# Patient Record
Sex: Male | Born: 1945 | Race: White | Hispanic: No | Marital: Married | Smoking: Never smoker
Health system: Southern US, Community
[De-identification: ages and names within clinical notes are randomized; demographics above are authoritative.]

## PROBLEM LIST (undated history)

## (undated) DIAGNOSIS — K219 Gastro-esophageal reflux disease without esophagitis: Secondary | ICD-10-CM

## (undated) DIAGNOSIS — F411 Generalized anxiety disorder: Secondary | ICD-10-CM

## (undated) DIAGNOSIS — Z9289 Personal history of other medical treatment: Secondary | ICD-10-CM

## (undated) DIAGNOSIS — M109 Gout, unspecified: Secondary | ICD-10-CM

## (undated) DIAGNOSIS — E785 Hyperlipidemia, unspecified: Secondary | ICD-10-CM

## (undated) DIAGNOSIS — Z7689 Persons encountering health services in other specified circumstances: Secondary | ICD-10-CM

## (undated) DIAGNOSIS — Z8719 Personal history of other diseases of the digestive system: Secondary | ICD-10-CM

## (undated) DIAGNOSIS — I2510 Atherosclerotic heart disease of native coronary artery without angina pectoris: Secondary | ICD-10-CM

## (undated) DIAGNOSIS — G709 Myoneural disorder, unspecified: Secondary | ICD-10-CM

## (undated) DIAGNOSIS — Z87442 Personal history of urinary calculi: Secondary | ICD-10-CM

## (undated) DIAGNOSIS — I739 Peripheral vascular disease, unspecified: Secondary | ICD-10-CM

## (undated) DIAGNOSIS — N189 Chronic kidney disease, unspecified: Secondary | ICD-10-CM

## (undated) DIAGNOSIS — C801 Malignant (primary) neoplasm, unspecified: Secondary | ICD-10-CM

## (undated) DIAGNOSIS — R0600 Dyspnea, unspecified: Secondary | ICD-10-CM

## (undated) DIAGNOSIS — I4891 Unspecified atrial fibrillation: Secondary | ICD-10-CM

## (undated) DIAGNOSIS — M1990 Unspecified osteoarthritis, unspecified site: Secondary | ICD-10-CM

## (undated) DIAGNOSIS — R519 Headache, unspecified: Secondary | ICD-10-CM

## (undated) DIAGNOSIS — I10 Essential (primary) hypertension: Secondary | ICD-10-CM

## (undated) DIAGNOSIS — I252 Old myocardial infarction: Secondary | ICD-10-CM

## (undated) HISTORY — PX: CARDIAC CATHETERIZATION: SHX172

## (undated) HISTORY — DX: Hyperlipidemia, unspecified: E78.5

## (undated) HISTORY — DX: Generalized anxiety disorder: F41.1

## (undated) HISTORY — DX: Atherosclerotic heart disease of native coronary artery without angina pectoris: I25.10

## (undated) HISTORY — DX: Gout, unspecified: M10.9

## (undated) HISTORY — PX: APPENDECTOMY: SHX54

## (undated) HISTORY — DX: Unspecified atrial fibrillation: I48.91

## (undated) HISTORY — DX: Old myocardial infarction: I25.2

## (undated) HISTORY — PX: EYE SURGERY: SHX253

## (undated) HISTORY — DX: Headache, unspecified: R51.9

## (undated) HISTORY — DX: Peripheral vascular disease, unspecified: I73.9

## (undated) HISTORY — PX: CHOLECYSTECTOMY: SHX55

## (undated) HISTORY — PX: OTHER SURGICAL HISTORY: SHX169

---

## 1968-11-09 HISTORY — PX: BLADDER SURGERY: SHX569

## 2011-11-10 HISTORY — PX: SHOULDER SURGERY: SHX246

## 2011-12-09 DIAGNOSIS — Q409 Congenital malformation of upper alimentary tract, unspecified: Secondary | ICD-10-CM | POA: Diagnosis not present

## 2011-12-09 DIAGNOSIS — Z79899 Other long term (current) drug therapy: Secondary | ICD-10-CM | POA: Diagnosis not present

## 2011-12-09 DIAGNOSIS — E785 Hyperlipidemia, unspecified: Secondary | ICD-10-CM | POA: Diagnosis not present

## 2011-12-09 DIAGNOSIS — I10 Essential (primary) hypertension: Secondary | ICD-10-CM | POA: Diagnosis not present

## 2011-12-09 DIAGNOSIS — G4700 Insomnia, unspecified: Secondary | ICD-10-CM | POA: Diagnosis not present

## 2011-12-09 DIAGNOSIS — J45909 Unspecified asthma, uncomplicated: Secondary | ICD-10-CM | POA: Diagnosis not present

## 2011-12-09 DIAGNOSIS — M159 Polyosteoarthritis, unspecified: Secondary | ICD-10-CM | POA: Diagnosis not present

## 2011-12-31 DIAGNOSIS — Z86010 Personal history of colonic polyps: Secondary | ICD-10-CM | POA: Diagnosis not present

## 2011-12-31 DIAGNOSIS — Z1211 Encounter for screening for malignant neoplasm of colon: Secondary | ICD-10-CM | POA: Diagnosis not present

## 2011-12-31 DIAGNOSIS — R1032 Left lower quadrant pain: Secondary | ICD-10-CM | POA: Diagnosis not present

## 2012-01-13 DIAGNOSIS — S32009A Unspecified fracture of unspecified lumbar vertebra, initial encounter for closed fracture: Secondary | ICD-10-CM | POA: Diagnosis not present

## 2012-01-13 DIAGNOSIS — R109 Unspecified abdominal pain: Secondary | ICD-10-CM | POA: Diagnosis not present

## 2012-01-14 DIAGNOSIS — G4700 Insomnia, unspecified: Secondary | ICD-10-CM | POA: Diagnosis not present

## 2012-01-14 DIAGNOSIS — M159 Polyosteoarthritis, unspecified: Secondary | ICD-10-CM | POA: Diagnosis not present

## 2012-01-14 DIAGNOSIS — Z79899 Other long term (current) drug therapy: Secondary | ICD-10-CM | POA: Diagnosis not present

## 2012-01-14 DIAGNOSIS — Z6828 Body mass index (BMI) 28.0-28.9, adult: Secondary | ICD-10-CM | POA: Diagnosis not present

## 2012-01-19 DIAGNOSIS — N401 Enlarged prostate with lower urinary tract symptoms: Secondary | ICD-10-CM | POA: Diagnosis not present

## 2012-01-26 DIAGNOSIS — N529 Male erectile dysfunction, unspecified: Secondary | ICD-10-CM | POA: Diagnosis not present

## 2012-01-26 DIAGNOSIS — N401 Enlarged prostate with lower urinary tract symptoms: Secondary | ICD-10-CM | POA: Diagnosis not present

## 2012-02-03 DIAGNOSIS — K648 Other hemorrhoids: Secondary | ICD-10-CM | POA: Diagnosis not present

## 2012-02-03 DIAGNOSIS — E7800 Pure hypercholesterolemia, unspecified: Secondary | ICD-10-CM | POA: Diagnosis not present

## 2012-02-03 DIAGNOSIS — K5730 Diverticulosis of large intestine without perforation or abscess without bleeding: Secondary | ICD-10-CM | POA: Diagnosis not present

## 2012-02-03 DIAGNOSIS — Z86010 Personal history of colonic polyps: Secondary | ICD-10-CM | POA: Diagnosis not present

## 2012-02-03 DIAGNOSIS — D126 Benign neoplasm of colon, unspecified: Secondary | ICD-10-CM | POA: Diagnosis not present

## 2012-02-03 DIAGNOSIS — Z79899 Other long term (current) drug therapy: Secondary | ICD-10-CM | POA: Diagnosis not present

## 2012-02-03 DIAGNOSIS — R1032 Left lower quadrant pain: Secondary | ICD-10-CM | POA: Diagnosis not present

## 2012-02-03 DIAGNOSIS — Z1211 Encounter for screening for malignant neoplasm of colon: Secondary | ICD-10-CM | POA: Diagnosis not present

## 2012-02-03 DIAGNOSIS — F341 Dysthymic disorder: Secondary | ICD-10-CM | POA: Diagnosis not present

## 2012-02-03 HISTORY — PX: COLONOSCOPY: SHX174

## 2012-02-12 DIAGNOSIS — Z79899 Other long term (current) drug therapy: Secondary | ICD-10-CM | POA: Diagnosis not present

## 2012-02-12 DIAGNOSIS — G4700 Insomnia, unspecified: Secondary | ICD-10-CM | POA: Diagnosis not present

## 2012-02-12 DIAGNOSIS — M159 Polyosteoarthritis, unspecified: Secondary | ICD-10-CM | POA: Diagnosis not present

## 2012-02-12 DIAGNOSIS — E538 Deficiency of other specified B group vitamins: Secondary | ICD-10-CM | POA: Diagnosis not present

## 2012-04-14 DIAGNOSIS — I10 Essential (primary) hypertension: Secondary | ICD-10-CM | POA: Diagnosis not present

## 2012-04-14 DIAGNOSIS — E785 Hyperlipidemia, unspecified: Secondary | ICD-10-CM | POA: Diagnosis not present

## 2012-04-14 DIAGNOSIS — E538 Deficiency of other specified B group vitamins: Secondary | ICD-10-CM | POA: Diagnosis not present

## 2012-04-14 DIAGNOSIS — M159 Polyosteoarthritis, unspecified: Secondary | ICD-10-CM | POA: Diagnosis not present

## 2012-04-14 DIAGNOSIS — Z125 Encounter for screening for malignant neoplasm of prostate: Secondary | ICD-10-CM | POA: Diagnosis not present

## 2012-04-14 DIAGNOSIS — J45909 Unspecified asthma, uncomplicated: Secondary | ICD-10-CM | POA: Diagnosis not present

## 2012-07-12 DIAGNOSIS — Z6828 Body mass index (BMI) 28.0-28.9, adult: Secondary | ICD-10-CM | POA: Diagnosis not present

## 2012-07-12 DIAGNOSIS — M75120 Complete rotator cuff tear or rupture of unspecified shoulder, not specified as traumatic: Secondary | ICD-10-CM | POA: Diagnosis not present

## 2012-07-21 DIAGNOSIS — M75120 Complete rotator cuff tear or rupture of unspecified shoulder, not specified as traumatic: Secondary | ICD-10-CM | POA: Diagnosis not present

## 2012-07-21 DIAGNOSIS — M25519 Pain in unspecified shoulder: Secondary | ICD-10-CM | POA: Diagnosis not present

## 2012-07-27 DIAGNOSIS — M75120 Complete rotator cuff tear or rupture of unspecified shoulder, not specified as traumatic: Secondary | ICD-10-CM | POA: Diagnosis not present

## 2012-07-27 DIAGNOSIS — Z01818 Encounter for other preprocedural examination: Secondary | ICD-10-CM | POA: Diagnosis not present

## 2012-07-27 DIAGNOSIS — Z0389 Encounter for observation for other suspected diseases and conditions ruled out: Secondary | ICD-10-CM | POA: Diagnosis not present

## 2012-07-29 DIAGNOSIS — S43429A Sprain of unspecified rotator cuff capsule, initial encounter: Secondary | ICD-10-CM | POA: Diagnosis not present

## 2012-07-29 DIAGNOSIS — M719 Bursopathy, unspecified: Secondary | ICD-10-CM | POA: Diagnosis not present

## 2012-07-29 DIAGNOSIS — M75110 Incomplete rotator cuff tear or rupture of unspecified shoulder, not specified as traumatic: Secondary | ICD-10-CM | POA: Diagnosis not present

## 2012-07-29 DIAGNOSIS — M67919 Unspecified disorder of synovium and tendon, unspecified shoulder: Secondary | ICD-10-CM | POA: Diagnosis not present

## 2012-07-29 DIAGNOSIS — IMO0002 Reserved for concepts with insufficient information to code with codable children: Secondary | ICD-10-CM | POA: Diagnosis not present

## 2012-07-29 DIAGNOSIS — M75100 Unspecified rotator cuff tear or rupture of unspecified shoulder, not specified as traumatic: Secondary | ICD-10-CM | POA: Diagnosis not present

## 2012-07-29 DIAGNOSIS — M66329 Spontaneous rupture of flexor tendons, unspecified upper arm: Secondary | ICD-10-CM | POA: Diagnosis not present

## 2012-07-29 DIAGNOSIS — G8918 Other acute postprocedural pain: Secondary | ICD-10-CM | POA: Diagnosis not present

## 2012-08-01 DIAGNOSIS — Z9889 Other specified postprocedural states: Secondary | ICD-10-CM | POA: Diagnosis not present

## 2012-08-01 DIAGNOSIS — R29898 Other symptoms and signs involving the musculoskeletal system: Secondary | ICD-10-CM | POA: Diagnosis not present

## 2012-08-01 DIAGNOSIS — M75120 Complete rotator cuff tear or rupture of unspecified shoulder, not specified as traumatic: Secondary | ICD-10-CM | POA: Diagnosis not present

## 2012-08-01 DIAGNOSIS — M25519 Pain in unspecified shoulder: Secondary | ICD-10-CM | POA: Diagnosis not present

## 2012-08-04 DIAGNOSIS — M25519 Pain in unspecified shoulder: Secondary | ICD-10-CM | POA: Diagnosis not present

## 2012-08-08 DIAGNOSIS — M25519 Pain in unspecified shoulder: Secondary | ICD-10-CM | POA: Diagnosis not present

## 2012-08-11 DIAGNOSIS — M25519 Pain in unspecified shoulder: Secondary | ICD-10-CM | POA: Diagnosis not present

## 2012-08-12 DIAGNOSIS — E538 Deficiency of other specified B group vitamins: Secondary | ICD-10-CM | POA: Diagnosis not present

## 2012-08-12 DIAGNOSIS — Z6828 Body mass index (BMI) 28.0-28.9, adult: Secondary | ICD-10-CM | POA: Diagnosis not present

## 2012-08-12 DIAGNOSIS — Z23 Encounter for immunization: Secondary | ICD-10-CM | POA: Diagnosis not present

## 2012-08-12 DIAGNOSIS — I10 Essential (primary) hypertension: Secondary | ICD-10-CM | POA: Diagnosis not present

## 2012-08-12 DIAGNOSIS — E785 Hyperlipidemia, unspecified: Secondary | ICD-10-CM | POA: Diagnosis not present

## 2012-08-12 DIAGNOSIS — M7020 Olecranon bursitis, unspecified elbow: Secondary | ICD-10-CM | POA: Diagnosis not present

## 2012-08-15 DIAGNOSIS — M25519 Pain in unspecified shoulder: Secondary | ICD-10-CM | POA: Diagnosis not present

## 2012-08-18 DIAGNOSIS — M25519 Pain in unspecified shoulder: Secondary | ICD-10-CM | POA: Diagnosis not present

## 2012-08-22 DIAGNOSIS — M25519 Pain in unspecified shoulder: Secondary | ICD-10-CM | POA: Diagnosis not present

## 2012-08-25 DIAGNOSIS — M25519 Pain in unspecified shoulder: Secondary | ICD-10-CM | POA: Diagnosis not present

## 2012-08-30 DIAGNOSIS — M25519 Pain in unspecified shoulder: Secondary | ICD-10-CM | POA: Diagnosis not present

## 2012-09-01 DIAGNOSIS — M25519 Pain in unspecified shoulder: Secondary | ICD-10-CM | POA: Diagnosis not present

## 2012-09-06 DIAGNOSIS — M25519 Pain in unspecified shoulder: Secondary | ICD-10-CM | POA: Diagnosis not present

## 2012-09-08 DIAGNOSIS — M25519 Pain in unspecified shoulder: Secondary | ICD-10-CM | POA: Diagnosis not present

## 2012-09-27 DIAGNOSIS — M25519 Pain in unspecified shoulder: Secondary | ICD-10-CM | POA: Diagnosis not present

## 2012-09-29 DIAGNOSIS — M75120 Complete rotator cuff tear or rupture of unspecified shoulder, not specified as traumatic: Secondary | ICD-10-CM | POA: Diagnosis not present

## 2012-09-29 DIAGNOSIS — M25519 Pain in unspecified shoulder: Secondary | ICD-10-CM | POA: Diagnosis not present

## 2012-10-03 DIAGNOSIS — M779 Enthesopathy, unspecified: Secondary | ICD-10-CM | POA: Diagnosis not present

## 2012-10-03 DIAGNOSIS — Z79899 Other long term (current) drug therapy: Secondary | ICD-10-CM | POA: Diagnosis not present

## 2012-10-04 DIAGNOSIS — M75120 Complete rotator cuff tear or rupture of unspecified shoulder, not specified as traumatic: Secondary | ICD-10-CM | POA: Diagnosis not present

## 2012-10-04 DIAGNOSIS — M25519 Pain in unspecified shoulder: Secondary | ICD-10-CM | POA: Diagnosis not present

## 2012-10-12 DIAGNOSIS — M779 Enthesopathy, unspecified: Secondary | ICD-10-CM | POA: Diagnosis not present

## 2012-10-27 DIAGNOSIS — F411 Generalized anxiety disorder: Secondary | ICD-10-CM | POA: Diagnosis not present

## 2012-10-27 DIAGNOSIS — I10 Essential (primary) hypertension: Secondary | ICD-10-CM | POA: Diagnosis not present

## 2012-10-27 DIAGNOSIS — M545 Low back pain: Secondary | ICD-10-CM | POA: Diagnosis not present

## 2012-10-27 DIAGNOSIS — Z79899 Other long term (current) drug therapy: Secondary | ICD-10-CM | POA: Diagnosis not present

## 2012-11-15 DIAGNOSIS — R1310 Dysphagia, unspecified: Secondary | ICD-10-CM | POA: Diagnosis not present

## 2012-11-15 DIAGNOSIS — K219 Gastro-esophageal reflux disease without esophagitis: Secondary | ICD-10-CM | POA: Diagnosis not present

## 2012-11-17 DIAGNOSIS — K224 Dyskinesia of esophagus: Secondary | ICD-10-CM | POA: Diagnosis not present

## 2012-11-17 DIAGNOSIS — K449 Diaphragmatic hernia without obstruction or gangrene: Secondary | ICD-10-CM | POA: Diagnosis not present

## 2012-11-17 DIAGNOSIS — R1310 Dysphagia, unspecified: Secondary | ICD-10-CM | POA: Diagnosis not present

## 2012-11-23 DIAGNOSIS — F411 Generalized anxiety disorder: Secondary | ICD-10-CM | POA: Diagnosis not present

## 2012-11-23 DIAGNOSIS — R1310 Dysphagia, unspecified: Secondary | ICD-10-CM | POA: Diagnosis not present

## 2012-11-23 DIAGNOSIS — G8929 Other chronic pain: Secondary | ICD-10-CM | POA: Diagnosis not present

## 2012-11-23 DIAGNOSIS — K2960 Other gastritis without bleeding: Secondary | ICD-10-CM | POA: Diagnosis not present

## 2012-11-23 DIAGNOSIS — IMO0002 Reserved for concepts with insufficient information to code with codable children: Secondary | ICD-10-CM | POA: Diagnosis not present

## 2012-11-23 DIAGNOSIS — K222 Esophageal obstruction: Secondary | ICD-10-CM | POA: Diagnosis not present

## 2012-11-23 DIAGNOSIS — F329 Major depressive disorder, single episode, unspecified: Secondary | ICD-10-CM | POA: Diagnosis not present

## 2012-11-23 DIAGNOSIS — E7800 Pure hypercholesterolemia, unspecified: Secondary | ICD-10-CM | POA: Diagnosis not present

## 2012-11-23 DIAGNOSIS — K219 Gastro-esophageal reflux disease without esophagitis: Secondary | ICD-10-CM | POA: Diagnosis not present

## 2012-11-23 DIAGNOSIS — K449 Diaphragmatic hernia without obstruction or gangrene: Secondary | ICD-10-CM | POA: Diagnosis not present

## 2012-11-23 DIAGNOSIS — Z79899 Other long term (current) drug therapy: Secondary | ICD-10-CM | POA: Diagnosis not present

## 2012-11-28 DIAGNOSIS — R070 Pain in throat: Secondary | ICD-10-CM | POA: Diagnosis not present

## 2012-11-28 DIAGNOSIS — R490 Dysphonia: Secondary | ICD-10-CM | POA: Diagnosis not present

## 2012-11-28 DIAGNOSIS — R1310 Dysphagia, unspecified: Secondary | ICD-10-CM | POA: Diagnosis not present

## 2012-11-28 DIAGNOSIS — K219 Gastro-esophageal reflux disease without esophagitis: Secondary | ICD-10-CM | POA: Diagnosis not present

## 2012-12-06 DIAGNOSIS — R490 Dysphonia: Secondary | ICD-10-CM | POA: Diagnosis not present

## 2012-12-06 DIAGNOSIS — R070 Pain in throat: Secondary | ICD-10-CM | POA: Diagnosis not present

## 2012-12-06 DIAGNOSIS — R1310 Dysphagia, unspecified: Secondary | ICD-10-CM | POA: Diagnosis not present

## 2012-12-14 DIAGNOSIS — R490 Dysphonia: Secondary | ICD-10-CM | POA: Diagnosis not present

## 2012-12-14 DIAGNOSIS — F458 Other somatoform disorders: Secondary | ICD-10-CM | POA: Diagnosis not present

## 2012-12-14 DIAGNOSIS — R1310 Dysphagia, unspecified: Secondary | ICD-10-CM | POA: Diagnosis not present

## 2012-12-14 DIAGNOSIS — K219 Gastro-esophageal reflux disease without esophagitis: Secondary | ICD-10-CM | POA: Diagnosis not present

## 2012-12-20 DIAGNOSIS — K219 Gastro-esophageal reflux disease without esophagitis: Secondary | ICD-10-CM | POA: Diagnosis not present

## 2012-12-20 DIAGNOSIS — R0609 Other forms of dyspnea: Secondary | ICD-10-CM | POA: Diagnosis not present

## 2012-12-20 DIAGNOSIS — R079 Chest pain, unspecified: Secondary | ICD-10-CM | POA: Diagnosis not present

## 2012-12-20 DIAGNOSIS — R0989 Other specified symptoms and signs involving the circulatory and respiratory systems: Secondary | ICD-10-CM | POA: Diagnosis not present

## 2012-12-28 DIAGNOSIS — R0609 Other forms of dyspnea: Secondary | ICD-10-CM | POA: Diagnosis not present

## 2012-12-28 DIAGNOSIS — I369 Nonrheumatic tricuspid valve disorder, unspecified: Secondary | ICD-10-CM | POA: Diagnosis not present

## 2012-12-28 DIAGNOSIS — I359 Nonrheumatic aortic valve disorder, unspecified: Secondary | ICD-10-CM | POA: Diagnosis not present

## 2012-12-28 DIAGNOSIS — R079 Chest pain, unspecified: Secondary | ICD-10-CM | POA: Diagnosis not present

## 2012-12-30 DIAGNOSIS — R079 Chest pain, unspecified: Secondary | ICD-10-CM | POA: Diagnosis not present

## 2012-12-30 DIAGNOSIS — R112 Nausea with vomiting, unspecified: Secondary | ICD-10-CM | POA: Diagnosis not present

## 2012-12-30 DIAGNOSIS — K5730 Diverticulosis of large intestine without perforation or abscess without bleeding: Secondary | ICD-10-CM | POA: Diagnosis not present

## 2012-12-30 DIAGNOSIS — K7689 Other specified diseases of liver: Secondary | ICD-10-CM | POA: Diagnosis not present

## 2012-12-30 DIAGNOSIS — R1310 Dysphagia, unspecified: Secondary | ICD-10-CM | POA: Diagnosis not present

## 2012-12-30 DIAGNOSIS — R0609 Other forms of dyspnea: Secondary | ICD-10-CM | POA: Diagnosis not present

## 2013-01-02 DIAGNOSIS — R5381 Other malaise: Secondary | ICD-10-CM | POA: Diagnosis not present

## 2013-01-02 DIAGNOSIS — R0609 Other forms of dyspnea: Secondary | ICD-10-CM | POA: Diagnosis not present

## 2013-01-02 DIAGNOSIS — J310 Chronic rhinitis: Secondary | ICD-10-CM | POA: Diagnosis not present

## 2013-01-02 DIAGNOSIS — R0989 Other specified symptoms and signs involving the circulatory and respiratory systems: Secondary | ICD-10-CM | POA: Diagnosis not present

## 2013-01-02 DIAGNOSIS — R5383 Other fatigue: Secondary | ICD-10-CM | POA: Diagnosis not present

## 2013-01-02 DIAGNOSIS — G4710 Hypersomnia, unspecified: Secondary | ICD-10-CM | POA: Diagnosis not present

## 2013-01-02 DIAGNOSIS — E559 Vitamin D deficiency, unspecified: Secondary | ICD-10-CM | POA: Diagnosis not present

## 2013-01-03 DIAGNOSIS — R0609 Other forms of dyspnea: Secondary | ICD-10-CM | POA: Diagnosis not present

## 2013-01-10 DIAGNOSIS — R5383 Other fatigue: Secondary | ICD-10-CM | POA: Diagnosis not present

## 2013-01-10 DIAGNOSIS — R5381 Other malaise: Secondary | ICD-10-CM | POA: Diagnosis not present

## 2013-01-10 DIAGNOSIS — Z006 Encounter for examination for normal comparison and control in clinical research program: Secondary | ICD-10-CM | POA: Diagnosis not present

## 2013-01-10 DIAGNOSIS — R0609 Other forms of dyspnea: Secondary | ICD-10-CM | POA: Diagnosis not present

## 2013-01-10 DIAGNOSIS — R0989 Other specified symptoms and signs involving the circulatory and respiratory systems: Secondary | ICD-10-CM | POA: Diagnosis not present

## 2013-01-10 DIAGNOSIS — G4710 Hypersomnia, unspecified: Secondary | ICD-10-CM | POA: Diagnosis not present

## 2013-01-10 DIAGNOSIS — J310 Chronic rhinitis: Secondary | ICD-10-CM | POA: Diagnosis not present

## 2013-01-10 DIAGNOSIS — G4730 Sleep apnea, unspecified: Secondary | ICD-10-CM | POA: Diagnosis not present

## 2013-01-18 DIAGNOSIS — G4710 Hypersomnia, unspecified: Secondary | ICD-10-CM | POA: Diagnosis not present

## 2013-01-18 DIAGNOSIS — G4730 Sleep apnea, unspecified: Secondary | ICD-10-CM | POA: Diagnosis not present

## 2013-01-20 DIAGNOSIS — N401 Enlarged prostate with lower urinary tract symptoms: Secondary | ICD-10-CM | POA: Diagnosis not present

## 2013-01-25 DIAGNOSIS — Z01818 Encounter for other preprocedural examination: Secondary | ICD-10-CM | POA: Diagnosis not present

## 2013-01-25 DIAGNOSIS — R0602 Shortness of breath: Secondary | ICD-10-CM | POA: Diagnosis not present

## 2013-01-25 DIAGNOSIS — I10 Essential (primary) hypertension: Secondary | ICD-10-CM | POA: Diagnosis not present

## 2013-01-26 DIAGNOSIS — R0989 Other specified symptoms and signs involving the circulatory and respiratory systems: Secondary | ICD-10-CM | POA: Diagnosis not present

## 2013-01-26 DIAGNOSIS — Z79899 Other long term (current) drug therapy: Secondary | ICD-10-CM | POA: Diagnosis not present

## 2013-01-26 DIAGNOSIS — R0609 Other forms of dyspnea: Secondary | ICD-10-CM | POA: Diagnosis not present

## 2013-01-26 DIAGNOSIS — E785 Hyperlipidemia, unspecified: Secondary | ICD-10-CM | POA: Diagnosis not present

## 2013-01-26 DIAGNOSIS — R0602 Shortness of breath: Secondary | ICD-10-CM | POA: Diagnosis not present

## 2013-01-26 DIAGNOSIS — I10 Essential (primary) hypertension: Secondary | ICD-10-CM | POA: Diagnosis not present

## 2013-01-26 DIAGNOSIS — I2510 Atherosclerotic heart disease of native coronary artery without angina pectoris: Secondary | ICD-10-CM | POA: Diagnosis not present

## 2013-01-27 DIAGNOSIS — Z79899 Other long term (current) drug therapy: Secondary | ICD-10-CM | POA: Diagnosis not present

## 2013-01-27 DIAGNOSIS — R0609 Other forms of dyspnea: Secondary | ICD-10-CM | POA: Diagnosis not present

## 2013-01-27 DIAGNOSIS — Z9861 Coronary angioplasty status: Secondary | ICD-10-CM | POA: Diagnosis not present

## 2013-01-27 DIAGNOSIS — I10 Essential (primary) hypertension: Secondary | ICD-10-CM | POA: Diagnosis not present

## 2013-01-27 DIAGNOSIS — E785 Hyperlipidemia, unspecified: Secondary | ICD-10-CM | POA: Diagnosis not present

## 2013-01-27 DIAGNOSIS — I2510 Atherosclerotic heart disease of native coronary artery without angina pectoris: Secondary | ICD-10-CM | POA: Diagnosis not present

## 2013-02-06 ENCOUNTER — Institutional Professional Consult (permissible substitution): Payer: Self-pay | Admitting: Cardiovascular Disease

## 2013-02-06 DIAGNOSIS — N401 Enlarged prostate with lower urinary tract symptoms: Secondary | ICD-10-CM | POA: Diagnosis not present

## 2013-02-06 DIAGNOSIS — N200 Calculus of kidney: Secondary | ICD-10-CM | POA: Diagnosis not present

## 2013-02-06 DIAGNOSIS — N529 Male erectile dysfunction, unspecified: Secondary | ICD-10-CM | POA: Diagnosis not present

## 2013-02-06 DIAGNOSIS — N139 Obstructive and reflux uropathy, unspecified: Secondary | ICD-10-CM | POA: Diagnosis not present

## 2013-02-28 DIAGNOSIS — Z9861 Coronary angioplasty status: Secondary | ICD-10-CM | POA: Diagnosis not present

## 2013-02-28 DIAGNOSIS — I10 Essential (primary) hypertension: Secondary | ICD-10-CM | POA: Diagnosis not present

## 2013-02-28 DIAGNOSIS — I2510 Atherosclerotic heart disease of native coronary artery without angina pectoris: Secondary | ICD-10-CM | POA: Diagnosis not present

## 2013-02-28 DIAGNOSIS — R0602 Shortness of breath: Secondary | ICD-10-CM | POA: Diagnosis not present

## 2013-03-06 DIAGNOSIS — M109 Gout, unspecified: Secondary | ICD-10-CM | POA: Diagnosis not present

## 2013-03-24 DIAGNOSIS — N529 Male erectile dysfunction, unspecified: Secondary | ICD-10-CM | POA: Diagnosis not present

## 2013-05-03 DIAGNOSIS — M48061 Spinal stenosis, lumbar region without neurogenic claudication: Secondary | ICD-10-CM | POA: Diagnosis not present

## 2013-05-05 DIAGNOSIS — M5126 Other intervertebral disc displacement, lumbar region: Secondary | ICD-10-CM | POA: Diagnosis not present

## 2013-05-05 DIAGNOSIS — IMO0002 Reserved for concepts with insufficient information to code with codable children: Secondary | ICD-10-CM | POA: Diagnosis not present

## 2013-05-09 DIAGNOSIS — M48061 Spinal stenosis, lumbar region without neurogenic claudication: Secondary | ICD-10-CM | POA: Diagnosis not present

## 2013-05-09 DIAGNOSIS — M5137 Other intervertebral disc degeneration, lumbosacral region: Secondary | ICD-10-CM | POA: Diagnosis not present

## 2013-05-09 DIAGNOSIS — IMO0002 Reserved for concepts with insufficient information to code with codable children: Secondary | ICD-10-CM | POA: Diagnosis not present

## 2013-05-18 ENCOUNTER — Other Ambulatory Visit: Payer: Self-pay | Admitting: Neurosurgery

## 2013-05-18 DIAGNOSIS — M5126 Other intervertebral disc displacement, lumbar region: Secondary | ICD-10-CM | POA: Diagnosis not present

## 2013-05-22 DIAGNOSIS — Z9861 Coronary angioplasty status: Secondary | ICD-10-CM | POA: Diagnosis not present

## 2013-05-22 DIAGNOSIS — Z01810 Encounter for preprocedural cardiovascular examination: Secondary | ICD-10-CM | POA: Diagnosis not present

## 2013-05-22 DIAGNOSIS — I10 Essential (primary) hypertension: Secondary | ICD-10-CM | POA: Diagnosis not present

## 2013-05-22 DIAGNOSIS — I2510 Atherosclerotic heart disease of native coronary artery without angina pectoris: Secondary | ICD-10-CM | POA: Diagnosis not present

## 2013-05-22 DIAGNOSIS — M47817 Spondylosis without myelopathy or radiculopathy, lumbosacral region: Secondary | ICD-10-CM | POA: Diagnosis not present

## 2013-05-23 ENCOUNTER — Encounter (HOSPITAL_COMMUNITY): Payer: Self-pay | Admitting: Pharmacy Technician

## 2013-05-23 DIAGNOSIS — Z6829 Body mass index (BMI) 29.0-29.9, adult: Secondary | ICD-10-CM | POA: Diagnosis not present

## 2013-05-23 DIAGNOSIS — J309 Allergic rhinitis, unspecified: Secondary | ICD-10-CM | POA: Diagnosis not present

## 2013-05-23 DIAGNOSIS — E785 Hyperlipidemia, unspecified: Secondary | ICD-10-CM | POA: Diagnosis not present

## 2013-05-23 DIAGNOSIS — I10 Essential (primary) hypertension: Secondary | ICD-10-CM | POA: Diagnosis not present

## 2013-05-23 DIAGNOSIS — Z79899 Other long term (current) drug therapy: Secondary | ICD-10-CM | POA: Diagnosis not present

## 2013-05-24 ENCOUNTER — Encounter (HOSPITAL_COMMUNITY): Admission: RE | Admit: 2013-05-24 | Payer: Medicare Other | Source: Ambulatory Visit

## 2013-05-24 DIAGNOSIS — Z79899 Other long term (current) drug therapy: Secondary | ICD-10-CM | POA: Diagnosis not present

## 2013-05-24 DIAGNOSIS — E785 Hyperlipidemia, unspecified: Secondary | ICD-10-CM | POA: Diagnosis not present

## 2013-05-25 ENCOUNTER — Other Ambulatory Visit: Payer: Self-pay | Admitting: Neurosurgery

## 2013-05-26 ENCOUNTER — Inpatient Hospital Stay (HOSPITAL_COMMUNITY)
Admission: RE | Admit: 2013-05-26 | Discharge: 2013-05-31 | DRG: 491 | Disposition: A | Discharge status: Home / Self Care | Payer: Medicare Other | Source: Ambulatory Visit | Attending: Neurosurgery | Admitting: Neurosurgery

## 2013-05-26 DIAGNOSIS — M519 Unspecified thoracic, thoracolumbar and lumbosacral intervertebral disc disorder: Secondary | ICD-10-CM | POA: Diagnosis not present

## 2013-05-26 DIAGNOSIS — M48061 Spinal stenosis, lumbar region without neurogenic claudication: Secondary | ICD-10-CM | POA: Diagnosis not present

## 2013-05-26 DIAGNOSIS — M47817 Spondylosis without myelopathy or radiculopathy, lumbosacral region: Principal | ICD-10-CM | POA: Diagnosis present

## 2013-05-26 DIAGNOSIS — Z01818 Encounter for other preprocedural examination: Secondary | ICD-10-CM | POA: Diagnosis not present

## 2013-05-26 DIAGNOSIS — M549 Dorsalgia, unspecified: Secondary | ICD-10-CM | POA: Diagnosis not present

## 2013-05-26 DIAGNOSIS — M5126 Other intervertebral disc displacement, lumbar region: Secondary | ICD-10-CM | POA: Diagnosis present

## 2013-05-26 DIAGNOSIS — M48062 Spinal stenosis, lumbar region with neurogenic claudication: Secondary | ICD-10-CM

## 2013-05-26 LAB — CBC
MCH: 33.8 pg (ref 26.0–34.0)
Platelets: 192 10*3/uL (ref 150–400)
RBC: 4.05 MIL/uL — ABNORMAL LOW (ref 4.22–5.81)
RDW: 13.2 % (ref 11.5–15.5)
WBC: 7.9 10*3/uL (ref 4.0–10.5)

## 2013-05-26 LAB — BASIC METABOLIC PANEL
CO2: 31 mEq/L (ref 19–32)
Chloride: 103 mEq/L (ref 96–112)
Creatinine, Ser: 1.32 mg/dL (ref 0.50–1.35)
Potassium: 3.4 mEq/L — ABNORMAL LOW (ref 3.5–5.1)

## 2013-05-26 MED ORDER — ATORVASTATIN CALCIUM 20 MG PO TABS
20.0000 mg | ORAL_TABLET | Freq: Every day | ORAL | Status: DC
  Administered 2013-05-26 – 2013-05-31 (×6): 20 mg via ORAL
  Filled 2013-05-26 (×6): qty 1

## 2013-05-26 MED ORDER — CEFAZOLIN SODIUM-DEXTROSE 2-3 GM-% IV SOLR: 2.0000 g | INTRAVENOUS | Status: DC

## 2013-05-26 MED ORDER — SODIUM CHLORIDE 0.9 % IJ SOLN
3.0000 mL | INTRAMUSCULAR | Status: DC | PRN
  Administered 2013-05-31: 3 mL via INTRAVENOUS

## 2013-05-26 MED ORDER — PANTOPRAZOLE SODIUM 40 MG PO TBEC
80.0000 mg | DELAYED_RELEASE_TABLET | Freq: Every day | ORAL | Status: DC
  Administered 2013-05-26 – 2013-05-31 (×5): 80 mg via ORAL
  Filled 2013-05-26 (×2): qty 2
  Filled 2013-05-26: qty 1
  Filled 2013-05-26 (×2): qty 2

## 2013-05-26 MED ORDER — ALPRAZOLAM 0.5 MG PO TABS
1.0000 mg | ORAL_TABLET | Freq: Every evening | ORAL | Status: DC | PRN
  Administered 2013-05-27 – 2013-05-30 (×5): 1 mg via ORAL
  Filled 2013-05-26: qty 1
  Filled 2013-05-26 (×3): qty 2
  Filled 2013-05-26: qty 1
  Filled 2013-05-26: qty 2

## 2013-05-26 MED ORDER — SODIUM CHLORIDE 0.9 % IV SOLN
250.0000 mL | INTRAVENOUS | Status: DC | PRN
  Administered 2013-05-26 – 2013-05-27 (×2): 250 mL via INTRAVENOUS

## 2013-05-26 MED ORDER — LORATADINE 10 MG PO TABS
10.0000 mg | ORAL_TABLET | Freq: Every day | ORAL | Status: DC
  Administered 2013-05-27 – 2013-05-31 (×5): 10 mg via ORAL
  Filled 2013-05-26 (×6): qty 1

## 2013-05-26 MED ORDER — CHLORTHALIDONE 25 MG PO TABS
25.0000 mg | ORAL_TABLET | Freq: Every day | ORAL | Status: DC
  Administered 2013-05-26 – 2013-05-29 (×4): 25 mg via ORAL
  Filled 2013-05-26 (×6): qty 1

## 2013-05-26 MED ORDER — OXYCODONE HCL 5 MG PO TABS
5.0000 mg | ORAL_TABLET | ORAL | Status: DC | PRN
  Administered 2013-05-29: 5 mg via ORAL

## 2013-05-26 MED ORDER — ACETAMINOPHEN 650 MG RE SUPP: 650.0000 mg | Freq: Four times a day (QID) | RECTAL | Status: DC | PRN

## 2013-05-26 MED ORDER — ATENOLOL 50 MG PO TABS
50.0000 mg | ORAL_TABLET | Freq: Every day | ORAL | Status: DC
  Administered 2013-05-26 – 2013-05-29 (×4): 50 mg via ORAL
  Filled 2013-05-26 (×6): qty 1

## 2013-05-26 MED ORDER — EPTIFIBATIDE 75 MG/100ML IV SOLN
2.0000 ug/kg/min | INTRAVENOUS | Status: DC
  Administered 2013-05-26 – 2013-05-27 (×2): 2 ug/kg/min via INTRAVENOUS
  Filled 2013-05-26 (×7): qty 100

## 2013-05-26 MED ORDER — ATENOLOL-CHLORTHALIDONE 50-25 MG PO TABS: 1.0000 | ORAL_TABLET | Freq: Every day | ORAL | Status: DC

## 2013-05-26 MED ORDER — ACETAMINOPHEN 325 MG PO TABS: 650.0000 mg | ORAL_TABLET | Freq: Four times a day (QID) | ORAL | Status: DC | PRN

## 2013-05-26 MED ORDER — OXYCODONE-ACETAMINOPHEN 5-325 MG PO TABS
1.0000 | ORAL_TABLET | ORAL | Status: DC | PRN
  Administered 2013-05-27 – 2013-05-28 (×5): 1 via ORAL
  Filled 2013-05-26 (×2): qty 1
  Filled 2013-05-26: qty 2
  Filled 2013-05-26 (×2): qty 1

## 2013-05-26 MED ORDER — HYDROMORPHONE HCL PF 1 MG/ML IJ SOLN
0.5000 mg | INTRAMUSCULAR | Status: DC | PRN
  Administered 2013-05-29 (×4): 0.5 mg via INTRAVENOUS

## 2013-05-26 MED ORDER — SODIUM CHLORIDE 0.9 % IJ SOLN
3.0000 mL | Freq: Two times a day (BID) | INTRAMUSCULAR | Status: DC
  Administered 2013-05-26 – 2013-05-29 (×3): 3 mL via INTRAVENOUS

## 2013-05-26 NOTE — Progress Notes (Signed)
Utilization review completed.  

## 2013-05-26 NOTE — Progress Notes (Addendum)
ANTICOAGULATION CONSULT NOTE - Initial Consult  Pharmacy Consult for Integrilin Indication: recent DES placement, bridging for surgery  No Known Allergies  Patient Measurements: Height: 5\' 11"  (180.3 cm) Weight: 208 lb (94.348 kg) IBW/kg (Calculated) : 75.3  Vital Signs: Temp: 97.8 F (36.6 C) (07/18 1431) Temp src: Oral (07/18 1431) BP: 114/58 mmHg (07/18 1431) Pulse Rate: 47 (07/18 1431)  Labs: No results found for this basename: HGB, HCT, PLT, APTT, LABPROT, INR, HEPARINUNFRC, CREATININE, CKTOTAL, CKMB, TROPONINI,  in the last 72 hours  CrCl is unknown because no creatinine reading has been taken.   Medical History: No past medical history on file.  Medications:  Prescriptions prior to admission  Medication Sig Dispense Refill  . ALPRAZolam (XANAX) 1 MG tablet Take 1 mg by mouth at bedtime as needed for sleep.      Marland Kitchen atenolol-chlorthalidone (TENORETIC) 50-25 MG per tablet Take 1 tablet by mouth daily.      Marland Kitchen atorvastatin (LIPITOR) 20 MG tablet Take 20 mg by mouth daily.      . cetirizine (ZYRTEC) 10 MG tablet Take 10 mg by mouth daily.      Marland Kitchen esomeprazole (NEXIUM) 40 MG capsule Take 40 mg by mouth daily before breakfast.      . oxyCODONE-acetaminophen (PERCOCET/ROXICET) 5-325 MG per tablet Take 1-2 tablets by mouth every 6 (six) hours as needed for pain.      . prasugrel (EFFIENT) 10 MG TABS Take 10 mg by mouth daily.         Assessment: 67 y/o male who had a DES placed 01/26/13 at [facility]. He presents today in preparation for a laminectomy and microdiscectomy next week. Pharmacy consulted to bridge with Integrilin while patient is off of Prasugrel for surgery - this was recommended by cardiologist. Patient is unsure whether his last dose of Prasugrel was last night or this morning. Patient has no available lab work in [employer].  Goal of Therapy:  prevent DES thrombosis Monitor platelets by anticoagulation protocol: Yes   Plan:  -BMET and CBC now -Will begin  Integrilin once labs are back  [facility], [place].D., BCPS Clinical Pharmacist Pager: [phone] 05/26/2013 3:21 PM   Addendum: SCr 1.32 Estimated Creatinine Clearance: 63.7 ml/min (by C-G formula based on Cr of 1.32).  WBC 7.9 H/H 13.7/39 Platelets 192  Baseline labs wnl.  Begin Integrilin at 2 mcg/kg/min.  CBC daily  [place], [address].D., BCPS Clinical Pharmacist Pager: [phone] 05/26/2013 5:44 PM

## 2013-05-26 NOTE — H&P (Signed)
Aaron Houston is an 67 y.o. male.   Chief Complaint: Back and right leg pain. HPI: 67 year old male with severe back and right lower extremity pain paresthesias and weakness consistent with a right-sided L5 radiculopathy. Workup has demonstrated evidence of a significant right-sided L4-5 disc herniation with associated spondylosis causing compression the right-sided L5 nerve root. Patient has a coexistent superior disc herniation of L5-S1 also causing L5 nerve root compression. The remainder of his lumbar spine looks recently healthy. Patient's failed conservative management. He presents now for two-level laminotomy and microdiscectomy in hopes of improving his symptoms. Patient's situation is complicated by a relatively recent placement of a drug-eluting coronary stent. The patient is felt to need bridging antiplatelet therapy around the time of surgery. He is being admitted early for his surgery to be placed on Integrilin.  No past medical history on file.  No past surgical history on file.  No family history on file. Social History:  has no tobacco, alcohol, and drug history on file.  Allergies: No Known Allergies  Medications Prior to Admission  Medication Sig Dispense Refill  . ALPRAZolam (XANAX) 1 MG tablet Take 1 mg by mouth at bedtime as needed for sleep.      Marland Kitchen atenolol-chlorthalidone (TENORETIC) 50-25 MG per tablet Take 1 tablet by mouth daily.      Marland Kitchen atorvastatin (LIPITOR) 20 MG tablet Take 20 mg by mouth daily.      . cetirizine (ZYRTEC) 10 MG tablet Take 10 mg by mouth daily.      Marland Kitchen esomeprazole (NEXIUM) 40 MG capsule Take 40 mg by mouth daily before breakfast.      . oxyCODONE-acetaminophen (PERCOCET/ROXICET) 5-325 MG per tablet Take 1-2 tablets by mouth every 6 (six) hours as needed for pain.      . prasugrel (EFFIENT) 10 MG TABS Take 10 mg by mouth daily.         No results found for this or any previous visit (from the past 48 hour(s)). No results found.  Review of  Systems  Constitutional: Negative.   HENT: Negative.   Eyes: Negative.   Respiratory: Negative.   Cardiovascular: Negative.   Gastrointestinal: Negative.   Genitourinary: Negative.   Musculoskeletal: Negative.   Skin: Negative.   Neurological: Negative.   Endo/Heme/Allergies: Negative.   Psychiatric/Behavioral: Negative.     Blood pressure 134/72, pulse 53, temperature 97.3 F (36.3 C), temperature source Oral, resp. rate 20, height 5\' 11"  (1.803 m), weight 94.348 kg (208 lb), SpO2 100.00%. Physical Exam  Constitutional: He is oriented to person, place, and time. He appears well-developed and well-nourished. No distress.  HENT:  Head: Normocephalic and atraumatic.  Right Ear: External ear normal.  Left Ear: External ear normal.  Nose: Nose normal.  Mouth/Throat: Oropharynx is clear and moist.  Eyes: Conjunctivae and EOM are normal. Pupils are equal, round, and reactive to light. Right eye exhibits no discharge. Left eye exhibits no discharge.  Neck: Normal range of motion. Neck supple. No tracheal deviation present. No thyromegaly present.  Cardiovascular: Normal rate, regular rhythm, normal heart sounds and intact distal pulses.  Exam reveals no friction rub.   No murmur heard. Respiratory: Effort normal and breath sounds normal. No respiratory distress. He has no wheezes.  GI: Soft. Bowel sounds are normal. He exhibits no distension. There is no tenderness.  Musculoskeletal: Normal range of motion. He exhibits no edema and no tenderness.  Neurological: He is alert and oriented to person, place, and time. He has normal reflexes. He displays  normal reflexes. No cranial nerve deficit. He exhibits normal muscle tone. Coordination normal.  Positive right straight leg raise. Right extensor hallucis longus 3/5. Right anterior tibialis 4/5. right L5 dermatomal sensory loss. Gait antalgic.  Skin: Skin is warm and dry. He is not diaphoretic.  Psychiatric: He has a normal mood and affect.  His behavior is normal. Judgment and thought content normal.     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 Plan right L4-5 and right L5-S1 laminotomy and microdiscectomy. Risks and benefits have been explained. Patient wishes to proceed. Patient will be admitted to the hospital 3 days early and placed on Integrilin prior to surgery.  Cleotilde Spadaccini A 05/26/2013, 2:01 PM

## 2013-05-27 ENCOUNTER — Encounter (HOSPITAL_COMMUNITY): Payer: Self-pay

## 2013-05-27 DIAGNOSIS — M5126 Other intervertebral disc displacement, lumbar region: Secondary | ICD-10-CM | POA: Diagnosis not present

## 2013-05-27 DIAGNOSIS — M47817 Spondylosis without myelopathy or radiculopathy, lumbosacral region: Secondary | ICD-10-CM | POA: Diagnosis not present

## 2013-05-27 LAB — SURGICAL PCR SCREEN
MRSA, PCR: NEGATIVE
Staphylococcus aureus: NEGATIVE

## 2013-05-27 LAB — CBC
Hemoglobin: 14.6 g/dL (ref 13.0–17.0)
RBC: 4.39 MIL/uL (ref 4.22–5.81)
WBC: 7.3 10*3/uL (ref 4.0–10.5)

## 2013-05-27 MED ORDER — CEFAZOLIN SODIUM-DEXTROSE 2-3 GM-% IV SOLR
2.0000 g | INTRAVENOUS | Status: AC
  Administered 2013-05-29: 2 g via INTRAVENOUS
  Filled 2013-05-27: qty 50

## 2013-05-27 MED ORDER — EPTIFIBATIDE 75 MG/100ML IV SOLN
2.0000 ug/kg/min | INTRAVENOUS | Status: DC
  Administered 2013-05-27 – 2013-05-28 (×6): 2 ug/kg/min via INTRAVENOUS
  Filled 2013-05-27 (×15): qty 100

## 2013-05-27 MED ORDER — EPTIFIBATIDE 75 MG/100ML IV SOLN
2.0000 ug/kg/min | INTRAVENOUS | Status: DC
  Administered 2013-05-27: 2 ug/kg/min via INTRAVENOUS

## 2013-05-27 NOTE — Progress Notes (Signed)
Overall doing well. Tolerating his Integrilin without difficulty. Pain recently well-controlled. Exam stable. Plan surgery Monday morning.

## 2013-05-27 NOTE — Plan of Care (Signed)
Problem: Phase I Progression Outcomes Goal: Pain controlled with appropriate interventions Outcome: Progressing Patient expresses desire to avoid stronger pain meds.  Will request narcotic if pain is stronger. Goal: OOB as tolerated unless otherwise ordered Outcome: Completed/Met Date Met:  05/27/13 Patient ambulating in room independently. Goal: Initial discharge plan identified Outcome: Progressing Pending surgery next week.

## 2013-05-27 NOTE — Progress Notes (Signed)
ANTICOAGULATION CONSULT NOTE - Follow Up Consult  Pharmacy Consult for integrilin  Indication: antiplatelet bridging for surgery  No Known Allergies  Patient Measurements: Height: 5\' 11"  (180.3 cm) Weight: 208 lb (94.348 kg) IBW/kg (Calculated) : 75.3  Vital Signs: Temp: 97.6 F (36.4 C) (07/19 0700) Temp src: Oral (07/19 0700) BP: 124/75 mmHg (07/19 0700) Pulse Rate: 46 (07/19 0700)  Labs:  Recent Labs  05/26/13 1627 05/27/13 0630  HGB 13.7 14.6  HCT 39.0 42.2  PLT 192 195  CREATININE 1.32  --     Estimated Creatinine Clearance: 63.7 ml/min (by C-G formula based on Cr of 1.32).  Assessment: Patient continues on integrilin 66mcg/kg/min for antiplatelet bridging while awaiting surgery on Monday. T1/2 of integrilin is 2.5 hour and return of platelet function after 4 hours. Will order medication to be stopped 8 hours prior to surgery to prevent bleeding complications. CBC remains stable and no bleeding issues have been noted.  Goal of Therapy:   Monitor platelets by anticoagulation protocol: Yes   Plan:  Continue integrilin at 11mcg/kg/min CBC daily Stop time in place for 7/21@0130  (8h prior to sx)  Sheppard Coil PharmD., BCPS Clinical Pharmacist Pager [phone] 05/27/2013 10:31 AM

## 2013-05-28 ENCOUNTER — Inpatient Hospital Stay (HOSPITAL_COMMUNITY): Payer: Medicare Other

## 2013-05-28 DIAGNOSIS — Z01818 Encounter for other preprocedural examination: Secondary | ICD-10-CM | POA: Diagnosis not present

## 2013-05-28 DIAGNOSIS — M47817 Spondylosis without myelopathy or radiculopathy, lumbosacral region: Secondary | ICD-10-CM | POA: Diagnosis not present

## 2013-05-28 DIAGNOSIS — M5126 Other intervertebral disc displacement, lumbar region: Secondary | ICD-10-CM | POA: Diagnosis not present

## 2013-05-28 LAB — CBC
HCT: 40 % (ref 39.0–52.0)
Hemoglobin: 13.8 g/dL (ref 13.0–17.0)
MCV: 94.6 fL (ref 78.0–100.0)
RBC: 4.23 MIL/uL (ref 4.22–5.81)
WBC: 6.7 10*3/uL (ref 4.0–10.5)

## 2013-05-28 NOTE — Progress Notes (Signed)
Doing well. Tolerating his Integrilin without difficulty   Temp:  [97.4 F (36.3 C)-98.3 F (36.8 C)] 97.6 F (36.4 C) (07/20 0213) Pulse Rate:  [43-87] 45 (07/20 0630) Resp:  [18] 18 (07/20 0630) BP: (117-132)/(63-81) 132/81 mmHg (07/20 0630) SpO2:  [98 %-100 %] 100 % (07/20 0630) Neuro stable  Plan: Plan surgery Monday morning

## 2013-05-29 ENCOUNTER — Inpatient Hospital Stay (HOSPITAL_COMMUNITY): Payer: Medicare Other

## 2013-05-29 ENCOUNTER — Encounter (HOSPITAL_COMMUNITY): Payer: Self-pay | Admitting: Anesthesiology

## 2013-05-29 ENCOUNTER — Encounter (HOSPITAL_COMMUNITY)
Admission: RE | Disposition: A | Discharge status: Home / Self Care | Payer: Self-pay | Source: Ambulatory Visit | Attending: Neurosurgery

## 2013-05-29 ENCOUNTER — Inpatient Hospital Stay (HOSPITAL_COMMUNITY): Payer: Medicare Other | Admitting: Anesthesiology

## 2013-05-29 DIAGNOSIS — M48061 Spinal stenosis, lumbar region without neurogenic claudication: Secondary | ICD-10-CM | POA: Diagnosis not present

## 2013-05-29 DIAGNOSIS — M519 Unspecified thoracic, thoracolumbar and lumbosacral intervertebral disc disorder: Secondary | ICD-10-CM | POA: Diagnosis not present

## 2013-05-29 DIAGNOSIS — M5126 Other intervertebral disc displacement, lumbar region: Secondary | ICD-10-CM | POA: Diagnosis not present

## 2013-05-29 DIAGNOSIS — M47817 Spondylosis without myelopathy or radiculopathy, lumbosacral region: Secondary | ICD-10-CM | POA: Diagnosis not present

## 2013-05-29 HISTORY — PX: LUMBAR LAMINECTOMY/DECOMPRESSION MICRODISCECTOMY: SHX5026

## 2013-05-29 LAB — CBC
HCT: 40.5 % (ref 39.0–52.0)
Hemoglobin: 13.9 g/dL (ref 13.0–17.0)
MCH: 32.6 pg (ref 26.0–34.0)
MCHC: 34.3 g/dL (ref 30.0–36.0)
RBC: 4.27 MIL/uL (ref 4.22–5.81)

## 2013-05-29 LAB — BASIC METABOLIC PANEL
BUN: 27 mg/dL — ABNORMAL HIGH (ref 6–23)
CO2: 30 mEq/L (ref 19–32)
Calcium: 9.3 mg/dL (ref 8.4–10.5)
GFR calc non Af Amer: 49 mL/min — ABNORMAL LOW (ref >90)
Glucose, Bld: 108 mg/dL — ABNORMAL HIGH (ref 70–99)

## 2013-05-29 LAB — TYPE AND SCREEN

## 2013-05-29 SURGERY — LUMBAR LAMINECTOMY/DECOMPRESSION MICRODISCECTOMY 2 LEVELS
Anesthesia: General | Site: Spine Lumbar | Laterality: Right | Wound class: Clean

## 2013-05-29 MED ORDER — SENNA 8.6 MG PO TABS
1.0000 | ORAL_TABLET | Freq: Two times a day (BID) | ORAL | Status: DC
  Administered 2013-05-29 – 2013-05-31 (×4): 8.6 mg via ORAL
  Filled 2013-05-29 (×5): qty 1

## 2013-05-29 MED ORDER — OXYCODONE HCL 5 MG/5ML PO SOLN: 5.0000 mg | Freq: Once | ORAL | Status: DC | PRN

## 2013-05-29 MED ORDER — ALUM & MAG HYDROXIDE-SIMETH 200-200-20 MG/5ML PO SUSP
30.0000 mL | Freq: Four times a day (QID) | ORAL | Status: DC | PRN
  Administered 2013-05-30: 30 mL via ORAL
  Filled 2013-05-29: qty 30

## 2013-05-29 MED ORDER — GLYCOPYRROLATE 0.2 MG/ML IJ SOLN
INTRAMUSCULAR | Status: DC | PRN
  Administered 2013-05-29: 0.6 mg via INTRAVENOUS

## 2013-05-29 MED ORDER — ARTIFICIAL TEARS OP OINT
TOPICAL_OINTMENT | OPHTHALMIC | Status: DC | PRN
  Administered 2013-05-29: 1 via OPHTHALMIC

## 2013-05-29 MED ORDER — FENTANYL CITRATE 0.05 MG/ML IJ SOLN
INTRAMUSCULAR | Status: DC | PRN
  Administered 2013-05-29: 250 ug via INTRAVENOUS

## 2013-05-29 MED ORDER — CEFAZOLIN SODIUM 1-5 GM-% IV SOLN
1.0000 g | Freq: Three times a day (TID) | INTRAVENOUS | Status: AC
  Administered 2013-05-29 – 2013-05-30 (×2): 1 g via INTRAVENOUS
  Filled 2013-05-29 (×3): qty 50

## 2013-05-29 MED ORDER — CYCLOBENZAPRINE HCL 10 MG PO TABS
ORAL_TABLET | ORAL | Status: AC
  Filled 2013-05-29: qty 1

## 2013-05-29 MED ORDER — OXYCODONE HCL 5 MG PO TABS
ORAL_TABLET | ORAL | Status: AC
  Filled 2013-05-29: qty 1

## 2013-05-29 MED ORDER — HEMOSTATIC AGENTS (NO CHARGE) OPTIME
TOPICAL | Status: DC | PRN
  Administered 2013-05-29: 1 via TOPICAL

## 2013-05-29 MED ORDER — NEOSTIGMINE METHYLSULFATE 1 MG/ML IJ SOLN
INTRAMUSCULAR | Status: DC | PRN
  Administered 2013-05-29: 4 mg via INTRAVENOUS

## 2013-05-29 MED ORDER — BUPIVACAINE HCL (PF) 0.25 % IJ SOLN
INTRAMUSCULAR | Status: DC | PRN
  Administered 2013-05-29: 20 mL

## 2013-05-29 MED ORDER — ONDANSETRON HCL 4 MG/2ML IJ SOLN
INTRAMUSCULAR | Status: DC | PRN
  Administered 2013-05-29: 4 mg via INTRAVENOUS

## 2013-05-29 MED ORDER — MIDAZOLAM HCL 2 MG/2ML IJ SOLN: 0.5000 mg | Freq: Once | INTRAMUSCULAR | Status: DC | PRN

## 2013-05-29 MED ORDER — OXYCODONE-ACETAMINOPHEN 5-325 MG PO TABS
1.0000 | ORAL_TABLET | ORAL | Status: DC | PRN
  Administered 2013-05-29: 1 via ORAL
  Administered 2013-05-29 – 2013-05-31 (×7): 2 via ORAL
  Filled 2013-05-29 (×4): qty 2
  Filled 2013-05-29: qty 1
  Filled 2013-05-29 (×2): qty 2
  Filled 2013-05-29 (×2): qty 1

## 2013-05-29 MED ORDER — MENTHOL 3 MG MT LOZG
1.0000 | LOZENGE | OROMUCOSAL | Status: DC | PRN
  Filled 2013-05-29: qty 9

## 2013-05-29 MED ORDER — LIDOCAINE HCL (CARDIAC) 20 MG/ML IV SOLN
INTRAVENOUS | Status: DC | PRN
  Administered 2013-05-29: 30 mg via INTRAVENOUS

## 2013-05-29 MED ORDER — THROMBIN 5000 UNITS EX SOLR
CUTANEOUS | Status: DC | PRN
  Administered 2013-05-29 (×2): 5000 [IU] via TOPICAL

## 2013-05-29 MED ORDER — SODIUM CHLORIDE 0.9 % IR SOLN
Status: DC | PRN
  Administered 2013-05-29: 12:00:00

## 2013-05-29 MED ORDER — 0.9 % SODIUM CHLORIDE (POUR BTL) OPTIME
TOPICAL | Status: DC | PRN
  Administered 2013-05-29: 1000 mL

## 2013-05-29 MED ORDER — HYDROMORPHONE HCL PF 1 MG/ML IJ SOLN: 0.5000 mg | INTRAMUSCULAR | Status: DC | PRN

## 2013-05-29 MED ORDER — SODIUM CHLORIDE 0.9 % IV SOLN: 250.0000 mL | INTRAVENOUS | Status: DC

## 2013-05-29 MED ORDER — MEPERIDINE HCL 25 MG/ML IJ SOLN: 6.2500 mg | INTRAMUSCULAR | Status: DC | PRN

## 2013-05-29 MED ORDER — ACETAMINOPHEN 325 MG PO TABS: 650.0000 mg | ORAL_TABLET | ORAL | Status: DC | PRN

## 2013-05-29 MED ORDER — ROCURONIUM BROMIDE 100 MG/10ML IV SOLN
INTRAVENOUS | Status: DC | PRN
  Administered 2013-05-29: 50 mg via INTRAVENOUS

## 2013-05-29 MED ORDER — PRASUGREL HCL 10 MG PO TABS
10.0000 mg | ORAL_TABLET | Freq: Every day | ORAL | Status: DC
  Administered 2013-05-29 – 2013-05-31 (×3): 10 mg via ORAL
  Filled 2013-05-29 (×4): qty 1

## 2013-05-29 MED ORDER — ACETAMINOPHEN 650 MG RE SUPP: 650.0000 mg | RECTAL | Status: DC | PRN

## 2013-05-29 MED ORDER — HYDROMORPHONE HCL PF 1 MG/ML IJ SOLN: 0.2500 mg | INTRAMUSCULAR | Status: DC | PRN

## 2013-05-29 MED ORDER — HYDROMORPHONE HCL PF 1 MG/ML IJ SOLN
INTRAMUSCULAR | Status: AC
  Filled 2013-05-29: qty 1

## 2013-05-29 MED ORDER — SODIUM CHLORIDE 0.9 % IJ SOLN
3.0000 mL | INTRAMUSCULAR | Status: DC | PRN
  Administered 2013-05-30: 3 mL via INTRAVENOUS

## 2013-05-29 MED ORDER — CYCLOBENZAPRINE HCL 10 MG PO TABS
10.0000 mg | ORAL_TABLET | Freq: Three times a day (TID) | ORAL | Status: DC | PRN
  Administered 2013-05-29: 10 mg via ORAL

## 2013-05-29 MED ORDER — HYDROCODONE-ACETAMINOPHEN 5-325 MG PO TABS: 1.0000 | ORAL_TABLET | ORAL | Status: DC | PRN

## 2013-05-29 MED ORDER — KETOROLAC TROMETHAMINE 30 MG/ML IJ SOLN
30.0000 mg | Freq: Four times a day (QID) | INTRAMUSCULAR | Status: DC
  Administered 2013-05-29 – 2013-05-30 (×5): 30 mg via INTRAVENOUS
  Filled 2013-05-29 (×12): qty 1

## 2013-05-29 MED ORDER — ONDANSETRON HCL 4 MG/2ML IJ SOLN: 4.0000 mg | INTRAMUSCULAR | Status: DC | PRN

## 2013-05-29 MED ORDER — ASPIRIN EC 81 MG PO TBEC
81.0000 mg | DELAYED_RELEASE_TABLET | Freq: Every morning | ORAL | Status: DC
  Administered 2013-05-30 – 2013-05-31 (×2): 81 mg via ORAL
  Filled 2013-05-29 (×2): qty 1

## 2013-05-29 MED ORDER — MIDAZOLAM HCL 5 MG/5ML IJ SOLN: INTRAMUSCULAR | Status: DC | PRN

## 2013-05-29 MED ORDER — PROPOFOL 10 MG/ML IV BOLUS
INTRAVENOUS | Status: DC | PRN
  Administered 2013-05-29: 70 mg via INTRAVENOUS

## 2013-05-29 MED ORDER — PHENOL 1.4 % MT LIQD: 1.0000 | OROMUCOSAL | Status: DC | PRN

## 2013-05-29 MED ORDER — PROMETHAZINE HCL 25 MG/ML IJ SOLN
6.2500 mg | INTRAMUSCULAR | Status: AC | PRN
  Administered 2013-05-29 (×2): 6.25 mg via INTRAVENOUS
  Filled 2013-05-29: qty 1

## 2013-05-29 MED ORDER — EPHEDRINE SULFATE 50 MG/ML IJ SOLN
INTRAMUSCULAR | Status: DC | PRN
  Administered 2013-05-29: 5 mg via INTRAVENOUS
  Administered 2013-05-29: 10 mg via INTRAVENOUS

## 2013-05-29 MED ORDER — SODIUM CHLORIDE 0.9 % IJ SOLN
3.0000 mL | Freq: Two times a day (BID) | INTRAMUSCULAR | Status: DC
  Administered 2013-05-29 – 2013-05-31 (×2): 3 mL via INTRAVENOUS

## 2013-05-29 MED ORDER — OXYCODONE HCL 5 MG PO TABS: 5.0000 mg | ORAL_TABLET | Freq: Once | ORAL | Status: DC | PRN

## 2013-05-29 MED ORDER — LACTATED RINGERS IV SOLN
INTRAVENOUS | Status: DC | PRN
  Administered 2013-05-29 (×2): via INTRAVENOUS

## 2013-05-29 SURGICAL SUPPLY — 59 items
BAG DECANTER FOR FLEXI CONT (MISCELLANEOUS) ×2 IMPLANT
BENZOIN TINCTURE PRP APPL 2/3 (GAUZE/BANDAGES/DRESSINGS) ×2 IMPLANT
BLADE SURG ROTATE 9660 (MISCELLANEOUS) ×2 IMPLANT
BRUSH SCRUB EZ PLAIN DRY (MISCELLANEOUS) ×2 IMPLANT
BUR CUTTER 7.0 ROUND (BURR) ×2 IMPLANT
CANISTER SUCTION 2500CC (MISCELLANEOUS) ×2 IMPLANT
CLOTH BEACON ORANGE TIMEOUT ST (SAFETY) ×2 IMPLANT
CONT SPEC 4OZ CLIKSEAL STRL BL (MISCELLANEOUS) ×2 IMPLANT
DECANTER SPIKE VIAL GLASS SM (MISCELLANEOUS) IMPLANT
DERMABOND ADHESIVE PROPEN (GAUZE/BANDAGES/DRESSINGS) ×1
DERMABOND ADVANCED (GAUZE/BANDAGES/DRESSINGS)
DERMABOND ADVANCED .7 DNX12 (GAUZE/BANDAGES/DRESSINGS) IMPLANT
DERMABOND ADVANCED .7 DNX6 (GAUZE/BANDAGES/DRESSINGS) ×1 IMPLANT
DRAPE LAPAROTOMY 100X72X124 (DRAPES) ×2 IMPLANT
DRAPE MICROSCOPE LEICA (MISCELLANEOUS) ×2 IMPLANT
DRAPE MICROSCOPE ZEISS OPMI (DRAPES) IMPLANT
DRAPE POUCH INSTRU U-SHP 10X18 (DRAPES) ×2 IMPLANT
DRAPE PROXIMA HALF (DRAPES) IMPLANT
DRAPE SURG 17X23 STRL (DRAPES) ×6 IMPLANT
DURAPREP 26ML APPLICATOR (WOUND CARE) ×2 IMPLANT
ELECT REM PT RETURN 9FT ADLT (ELECTROSURGICAL) ×2
ELECTRODE REM PT RTRN 9FT ADLT (ELECTROSURGICAL) ×1 IMPLANT
EVACUATOR 1/8 PVC DRAIN (DRAIN) ×2 IMPLANT
GAUZE SPONGE 4X4 16PLY XRAY LF (GAUZE/BANDAGES/DRESSINGS) IMPLANT
GLOVE BIOGEL PI IND STRL 7.0 (GLOVE) ×2 IMPLANT
GLOVE BIOGEL PI IND STRL 7.5 (GLOVE) ×1 IMPLANT
GLOVE BIOGEL PI IND STRL 8 (GLOVE) ×1 IMPLANT
GLOVE BIOGEL PI INDICATOR 7.0 (GLOVE) ×2
GLOVE BIOGEL PI INDICATOR 7.5 (GLOVE) ×1
GLOVE BIOGEL PI INDICATOR 8 (GLOVE) ×1
GLOVE ECLIPSE 7.5 STRL STRAW (GLOVE) ×6 IMPLANT
GLOVE ECLIPSE 8.5 STRL (GLOVE) ×2 IMPLANT
GLOVE EXAM NITRILE LRG STRL (GLOVE) IMPLANT
GLOVE EXAM NITRILE MD LF STRL (GLOVE) IMPLANT
GLOVE EXAM NITRILE XL STR (GLOVE) IMPLANT
GLOVE EXAM NITRILE XS STR PU (GLOVE) IMPLANT
GLOVE SS BIOGEL STRL SZ 6.5 (GLOVE) ×2 IMPLANT
GLOVE SUPERSENSE BIOGEL SZ 6.5 (GLOVE) ×2
GOWN BRE IMP SLV AUR LG STRL (GOWN DISPOSABLE) ×2 IMPLANT
GOWN BRE IMP SLV AUR XL STRL (GOWN DISPOSABLE) ×4 IMPLANT
GOWN STRL REIN 2XL LVL4 (GOWN DISPOSABLE) IMPLANT
KIT BASIN OR (CUSTOM PROCEDURE TRAY) ×2 IMPLANT
KIT ROOM TURNOVER OR (KITS) ×2 IMPLANT
NEEDLE HYPO 22GX1.5 SAFETY (NEEDLE) ×2 IMPLANT
NEEDLE SPNL 22GX3.5 QUINCKE BK (NEEDLE) ×2 IMPLANT
NS IRRIG 1000ML POUR BTL (IV SOLUTION) ×2 IMPLANT
PACK LAMINECTOMY NEURO (CUSTOM PROCEDURE TRAY) ×2 IMPLANT
PAD ARMBOARD 7.5X6 YLW CONV (MISCELLANEOUS) ×10 IMPLANT
RUBBERBAND STERILE (MISCELLANEOUS) ×4 IMPLANT
SPONGE GAUZE 4X4 12PLY (GAUZE/BANDAGES/DRESSINGS) ×2 IMPLANT
SPONGE SURGIFOAM ABS GEL SZ50 (HEMOSTASIS) ×2 IMPLANT
STRIP CLOSURE SKIN 1/2X4 (GAUZE/BANDAGES/DRESSINGS) ×2 IMPLANT
SUT VIC AB 2-0 CT1 18 (SUTURE) ×2 IMPLANT
SUT VIC AB 3-0 SH 8-18 (SUTURE) ×2 IMPLANT
SYR 20ML ECCENTRIC (SYRINGE) ×2 IMPLANT
TAPE CLOTH SURG 4X10 WHT LF (GAUZE/BANDAGES/DRESSINGS) ×2 IMPLANT
TOWEL OR 17X24 6PK STRL BLUE (TOWEL DISPOSABLE) ×2 IMPLANT
TOWEL OR 17X26 10 PK STRL BLUE (TOWEL DISPOSABLE) ×2 IMPLANT
WATER STERILE IRR 1000ML POUR (IV SOLUTION) ×2 IMPLANT

## 2013-05-29 NOTE — Brief Op Note (Signed)
05/26/2013 - 05/29/2013  1:02 PM  PATIENT:  Aaron Houston  67 y.o. male  PRE-OPERATIVE DIAGNOSIS:  Lumbar hnp without myelopathy  POST-OPERATIVE DIAGNOSIS:  Lumbar Herniated Nucleous Pulposus without Myelopathy  PROCEDURE:  Procedure(s) with comments: LUMBAR LAMINECTOMY/DECOMPRESSION MICRODISCECTOMY 2 LEVELS (Right) - Right Lumbar four-five,Lumbar Five-Sacral OneMicrodiskectomy  SURGEON:  Surgeon(s) and Role:    * Temple Pacini, MD - Primary    * Hewitt Shorts, MD - Assisting  PHYSICIAN ASSISTANT:   ASSISTANTS:    ANESTHESIA:     EBL:  Total I/O In: 1400 [I.V.:1400] Out: 300 [Blood:300]  BLOOD ADMINISTERED:none  DRAINS: (Medium) Hemovact drain(s) in the Epidural space with  Suction Open   LOCAL MEDICATIONS USED:  MARCAINE     SPECIMEN:  No Specimen  DISPOSITION OF SPECIMEN:  N/A  COUNTS:  YES  TOURNIQUET:  * No tourniquets in log *  DICTATION: .Dragon Dictation  PLAN OF CARE: Admit for overnight observation  PATIENT DISPOSITION:  PACU - hemodynamically stable.   Delay start of Pharmacological VTE agent (>24hrs) due to surgical blood loss or risk of bleeding: yes

## 2013-05-29 NOTE — Anesthesia Preprocedure Evaluation (Addendum)
Anesthesia Evaluation  Patient identified by MRN, date of birth, ID band Patient awake    Reviewed: Allergy & Precautions, H&P , NPO status , Patient's Chart, lab work & pertinent test results, reviewed documented beta blocker date and time   History of Anesthesia Complications Negative for: history of anesthetic complications  Airway Mallampati: II TM Distance: >3 FB Neck ROM: Full    Dental  (+) Teeth Intact and Dental Advisory Given   Pulmonary neg pulmonary ROS,  breath sounds clear to auscultation  Pulmonary exam normal       Cardiovascular hypertension, Pt. on medications and Pt. on home beta blockers + Cardiac Stents (DES to diag in 3/14on integrillin crossover, surgeon wishes to proceed given patient's foot drop) Rhythm:Regular Rate:Normal     Neuro/Psych Chronic back pain: narcotics negative neurological ROS     GI/Hepatic Neg liver ROS, GERD-  Medicated and Controlled,  Endo/Other    Renal/GU Renal InsufficiencyRenal disease (creat 1.44)     Musculoskeletal   Abdominal   Peds  Hematology   Anesthesia Other Findings   Reproductive/Obstetrics                         Anesthesia Physical Anesthesia Plan  ASA: III  Anesthesia Plan: General   Post-op Pain Management:    Induction: Intravenous  Airway Management Planned: Oral ETT  Additional Equipment:   Intra-op Plan:   Post-operative Plan: Extubation in OR  Informed Consent: I have reviewed the patients History and Physical, chart, labs and discussed the procedure including the risks, benefits and alternatives for the proposed anesthesia with the patient or authorized representative who has indicated his/her understanding and acceptance.   Dental advisory given  Plan Discussed with: Surgeon and CRNA  Anesthesia Plan Comments: (Plan routine monitors, GETA)        Anesthesia Quick Evaluation

## 2013-05-29 NOTE — Op Note (Signed)
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 Right L5-S1 stenosis and associated disc herniation  Postoperative diagnosis: Same  Procedure Name: Right L4-5 decompressive laminotomy and right L5-S1 decompressive laminotomy.  Surgeon:Arnola Crittendon A.Kathren Scearce, M.D.  Asst. Surgeon: Newell Coral  Anesthesia: General  Indication: 67 year old male with severe right lower extremity pain paresthesias and weakness particularly severe with standing or walking. Workup demonstrates evidence of marked spondylosis and stenosis at L4-5 and to lesser L5-S1 with associated disc herniations at both levels causing marked compression the L5 nerve root and a lesser degree of compression the right-sided S1 nerve root. Patient has failed conservative management. He presents now for operative decompression in hopes of improving his symptoms.  purply Operative note: After induction anesthesia, patient positioned prone onto Wilson frame and appropriately. Lumbar region prepped and draped. Incision made overlying the L4-5 levels. Subperiosteal dissection performed the right side exposing the lamina and facet joints of L4-L5 and S1 on the right. Self retaining retractors placed intraoperative x-ray taken levels confirmed. Laminotomy performed using high-speed drill and Kerrison rongeurs to remove the inferior aspect of lamina of L4 medial aspect the L4-5 facet joint and the superior rim of the L5 lamina. L5 laminotomy performed a similar fashion removing use inferior aspect of lamina L5 medial aspect the L5-S1 facet joint and the superior rim of the S1 lamina. Ligament flavum was elevated and resected so fashion using Kerrison rongeurs. Underlying thecal sac was identified. The residual lamina of L5 was resected secondary to residual stenosis. Decompressive foraminotomies performed on course exiting L4-L5 and S1  nerve roots on the right side by undercutting the facet joints on the right. During the process of a decompression the spinous process of L5 fractured and was removed. After very thorough decompression achieved microscope brought field these were microdissection. Thecal sac and L5 nerve root were gently mobilized towards the midline. A small element of inferior disc herniation was encountered and resected. Likewise of the L5-S1 level a small superior disc herniation was encountered and resected. At this point a very thorough decompression been achieved. There is no his injury to thecal sac and nerve roots. Wound is then irrigated with and bike solution. Gelfoam with postoperative hemostasis. A medium Hemovac drain was left at per space. Wounds and close in layers with Vicryl sutures. Steri-Strips triggers were applied. There were no apparent complications. Patient tolerated the procedure well and he returns to the recovery room postop.

## 2013-05-29 NOTE — Anesthesia Postprocedure Evaluation (Signed)
  Anesthesia Post-op Note  Patient: Aaron Houston  Procedure(s) Performed: Procedure(s) with comments: LUMBAR LAMINECTOMY/DECOMPRESSION MICRODISCECTOMY 2 LEVELS (Right) - Right Lumbar four-five,Lumbar Five-Sacral OneMicrodiskectomy  Patient Location: PACU  Anesthesia Type:General  Level of Consciousness: awake, alert , oriented and patient cooperative  Airway and Oxygen Therapy: Patient Spontanous Breathing and Patient connected to nasal cannula oxygen  Post-op Pain: mild  Post-op Assessment: Post-op Vital signs reviewed, Patient's Cardiovascular Status Stable, Respiratory Function Stable, Patent Airway, No signs of Nausea or vomiting and Pain level controlled  Post-op Vital Signs: Reviewed and stable  Complications: No apparent anesthesia complications

## 2013-05-29 NOTE — Preoperative (Signed)
Beta Blockers   Reason not to administer Beta Blockers:Not Applicable 

## 2013-05-29 NOTE — Transfer of Care (Signed)
Immediate Anesthesia Transfer of Care Note  Patient: Aaron Houston  Procedure(s) Performed: Procedure(s) with comments: LUMBAR LAMINECTOMY/DECOMPRESSION MICRODISCECTOMY 2 LEVELS (Right) - Right Lumbar four-five,Lumbar Five-Sacral OneMicrodiskectomy  Patient Location: PACU  Anesthesia Type:General  Level of Consciousness: awake, alert  and oriented  Airway & Oxygen Therapy: Patient Spontanous Breathing and Patient connected to face mask oxygen  Post-op Assessment: Report given to PACU RN  Post vital signs: Reviewed and stable  Complications: No apparent anesthesia complications

## 2013-05-30 LAB — CBC
HCT: 34.5 % — ABNORMAL LOW (ref 39.0–52.0)
Hemoglobin: 11.7 g/dL — ABNORMAL LOW (ref 13.0–17.0)
MCV: 97.7 fL (ref 78.0–100.0)
WBC: 8.3 10*3/uL (ref 4.0–10.5)

## 2013-05-30 MED ORDER — SODIUM CHLORIDE 0.9 % IV BOLUS (SEPSIS)
500.0000 mL | Freq: Once | INTRAVENOUS | Status: AC
  Administered 2013-05-30: 500 mL via INTRAVENOUS

## 2013-05-30 MED ORDER — SODIUM CHLORIDE 0.9 % IV SOLN
INTRAVENOUS | Status: DC
  Administered 2013-05-30 (×2): via INTRAVENOUS

## 2013-05-30 NOTE — Progress Notes (Signed)
Patient with a difficult night last night. Complains of incisional discomfort. Still having some cramping in the posterior aspect of his legs. Feels better today.  Afebrile. Vitals are stable. Dressing dry. Drain output low. Awake and alert. Oriented and appropriate. Motor and sensory function intact.  Progressing reasonably well. Plan to mobilize today. We'll start physical therapy. Probable discharge tomorrow.

## 2013-05-31 LAB — CBC
MCH: 32.7 pg (ref 26.0–34.0)
MCV: 97.5 fL (ref 78.0–100.0)
Platelets: 152 10*3/uL (ref 150–400)
RDW: 13.1 % (ref 11.5–15.5)
WBC: 7.2 10*3/uL (ref 4.0–10.5)

## 2013-05-31 MED ORDER — OXYCODONE-ACETAMINOPHEN 5-325 MG PO TABS: 1.0000 | ORAL_TABLET | ORAL | Status: DC | PRN

## 2013-05-31 MED ORDER — CYCLOBENZAPRINE HCL 10 MG PO TABS: 10.0000 mg | ORAL_TABLET | Freq: Three times a day (TID) | ORAL | Status: DC | PRN

## 2013-05-31 NOTE — Evaluation (Signed)
Physical Therapy Evaluation Patient Details Name: Aaron Houston MRN: [id number] DOB: 12/17/1945 Today's Date: 05/31/2013 Time: 4098-1191 PT Time Calculation (min): 21 min  PT Assessment / Plan / Recommendation History of Present Illness  67 yo male s/p microdisectomy L4-5 L5-S1   Clinical Impression  Educated/reinforced pt on back precautions/care incl bed mob/logroll,lifting restrictions, progression of activity.  Pt demonstrated understanding.  No further PT needs, no follow up.    PT Assessment       Follow Up Recommendations  No PT follow up    Does the patient have the potential to tolerate intense rehabilitation      Barriers to Discharge        Equipment Recommendations  None recommended by PT    Recommendations for Other Services     Frequency      Precautions / Restrictions Precautions Precautions: Back Restrictions Weight Bearing Restrictions: No   Pertinent Vitals/Pain       Mobility  Bed Mobility Bed Mobility: Supine to Sit;Sitting - Scoot to Edge of Bed Supine to Sit: 6: Modified independent (Device/Increase time) Sitting - Scoot to Edge of Bed: 6: Modified independent (Device/Increase time) Transfers Transfers: Stand to Sit;Sit to Stand Sit to Stand: 6: Modified independent (Device/Increase time);From bed;With upper extremity assist Stand to Sit: 6: Modified independent (Device/Increase time);To chair/3-in-1;With upper extremity assist Ambulation/Gait Ambulation/Gait Assistance: 6: Modified independent (Device/Increase time) Ambulation Distance (Feet): 150 Feet Assistive device: None Ambulation/Gait Assistance Details: mildly antalgic, but steady Gait Pattern: Within Functional Limits;Antalgic Stairs: Yes Stairs Assistance: 6: Modified independent (Device/Increase time) Stair Management Technique: One rail Left;Alternating pattern;Forwards Number of Stairs: 6    Exercises     PT Diagnosis:    PT Problem List:   PT Treatment Interventions:        PT Goals(Current goals can be found in the care plan section)    Visit Information  Last PT Received On: 05/31/13 Assistance Needed: +1 History of Present Illness: 67 yo male s/p microdisectomy L4-5 L5-S1        Prior Functioning  Home Living Family/patient expects to be discharged to:: Private residence Living Arrangements: Spouse/significant other Available Help at Discharge: Family Type of Home: Mobile home Home Access: Stairs to enter Secretary/administrator of Steps: 5 Home Layout: One level Home Equipment: None Prior Function Level of Independence: Independent Communication Communication: No difficulties Dominant Hand: Right    Cognition  Cognition Arousal/Alertness: Awake/alert Behavior During Therapy: WFL for tasks assessed/performed Overall Cognitive Status: Within Functional Limits for tasks assessed    Extremity/Trunk Assessment Upper Extremity Assessment Upper Extremity Assessment: Overall WFL for tasks assessed Lower Extremity Assessment Lower Extremity Assessment: Overall WFL for tasks assessed (mild antalgic gait due to pain) Cervical / Trunk Assessment Cervical / Trunk Assessment: Normal   Balance    End of Session PT - End of Session Activity Tolerance: Patient tolerated treatment well Patient left: in bed;with call bell/phone within reach Nurse Communication: Mobility status  GP     Aaron Houston, Aaron Houston 05/31/2013, 10:46 AM

## 2013-05-31 NOTE — Care Management Note (Signed)
    Page 1 of 1   05/31/2013     10:51:46 AM   CARE MANAGEMENT NOTE 05/31/2013  Patient:  Aaron Houston, Aaron Houston   Account Number:  [id number]  Date Initiated:  05/30/2013  Documentation initiated by:  Jiles Crocker  Subjective/Objective Assessment:   ADMITTED WITH severe back and right lower extremity pain paresthesias and weakness consistent with a right-sided L5 radiculopathy; To surgery 05/29/2013- Right L4-5 decompressive laminotomy and right L5-S1 decompressive laminotomy.     Action/Plan:   lives at home with spouse; CM following for dcp; awaiting on PT/OT evals to assist with disposition   Anticipated DC Date:  06/03/2013   Anticipated DC Plan:  HOME W HOME HEALTH SERVICES         Choice offered to / List presented to:             Status of service:  Completed, signed off Medicare Important Message given?   (If response is "NO", the following Medicare IM given date fields will be blank) Date Medicare IM given:   Date Additional Medicare IM given:    Discharge Disposition:  HOME/SELF CARE  Per UR Regulation:  Reviewed for med. necessity/level of care/duration of stay  If discussed at Long Length of Stay Meetings, dates discussed:    Comments:  05/31/13 1050 Elmer Bales RN, MSN, CM- PT/OT notes reviewed, no follow-up recommended at this time.  Pt's RN updated.   05/30/2013- B CHANDLER RN,BSN,MHA

## 2013-05-31 NOTE — Discharge Summary (Signed)
Physician Discharge Summary  Patient ID: Halton Neas MRN: [id number] DOB/AGE: 07/30/1946 67 y.o.  Admit date: 05/26/2013 Discharge date: 05/31/2013  Admission Diagnoses:  Discharge Diagnoses:  Active Problems:   * No active hospital problems. *   Discharged Condition: good  Hospital Course: Patient admitted to the hospital for right-sided L4-5 and L5-S1 decompressive surgery. Patient was admitted 3 days preoperatively in order to stop his antiplatelet therapy and bridge him with integral in because of a previously placed drug-eluting stent. Patient had no difficulty prior to surgery and tolerated the integral and well. Surgery was uneventful and overall postoperative patient is done  Consults:   Significant Diagnostic Studies:   Treatments:   Discharge Exam: Blood pressure 114/60, pulse 62, temperature 97.8 F (36.6 C), temperature source Oral, resp. rate 18, height 5\' 11"  (1.803 m), weight 94.348 kg (208 lb), SpO2 99.00%. Awake and alert oriented and appropriate. Cranial nerve function intact. Motor and sensory function of the extremities normal. Dressing clean and dry. Chest and abdomen benign.  Disposition: Final discharge disposition not confirmed     Medication List         ALPRAZolam 1 MG tablet  Commonly known as:  XANAX  Take 1 mg by mouth at bedtime as needed for sleep.     aspirin EC 81 MG tablet  Take 81 mg by mouth every morning.     atenolol-chlorthalidone 50-25 MG per tablet  Commonly known as:  TENORETIC  Take 1 tablet by mouth daily.     atorvastatin 20 MG tablet  Commonly known as:  LIPITOR  Take 20 mg by mouth daily.     cetirizine 10 MG tablet  Commonly known as:  ZYRTEC  Take 10 mg by mouth daily.     cyclobenzaprine 10 MG tablet  Commonly known as:  FLEXERIL  Take 1 tablet (10 mg total) by mouth 3 (three) times daily as needed for muscle spasms.     esomeprazole 40 MG capsule  Commonly known as:  NEXIUM  Take 40 mg by mouth daily  before breakfast.     oxyCODONE-acetaminophen 5-325 MG per tablet  Commonly known as:  PERCOCET/ROXICET  Take 1-2 tablets by mouth every 4 (four) hours as needed for pain.     prasugrel 10 MG Tabs  Commonly known as:  EFFIENT  Take 10 mg by mouth daily.           Follow-up Information   Follow up with Reveca Desmarais A, MD. Call in 1 week. (ext [phone])    Contact information:   [address] [place] [address] [phone]       Signed: Irasema Chalk A 05/31/2013, 9:35 AM

## 2013-05-31 NOTE — Evaluation (Signed)
Occupational Therapy Evaluation Patient Details Name: Schneur Crowson MRN: [id number] DOB: 03/15/1946 Today's Date: 05/31/2013 Time: 1478-2956 OT Time Calculation (min): 23 min  OT Assessment / Plan / Recommendation History of present illness 67 yo male s/p microdisectomy L4-5 L5-S1    Clinical Impression   Patient evaluated by Occupational Therapy with no further acute OT needs identified. All education has been completed and the patient has no further questions. See below for any follow-up Occupational Therapy or equipment needs. OT to sign off. Thank you for referral.      OT Assessment  Patient does not need any further OT services    Follow Up Recommendations  No OT follow up    Barriers to Discharge      Equipment Recommendations  None recommended by OT    Recommendations for Other Services    Frequency       Precautions / Restrictions Precautions Precautions: Back   Pertinent Vitals/Pain None reported at this time Back handout provided    ADL  Eating/Feeding: Independent Where Assessed - Eating/Feeding: Chair Grooming: Wash/dry hands;Wash/dry face;Teeth care;Modified independent Where Assessed - Grooming: Unsupported standing Upper Body Bathing: Chest;Right arm;Left arm;Abdomen;Modified independent Where Assessed - Upper Body Bathing: Unsupported sit to stand Lower Body Dressing: Modified independent Where Assessed - Lower Body Dressing: Supine, head of bed up (supine to don LB dressing) Toilet Transfer: Modified independent Toilet Transfer Method: Stand pivot;Sit to Barista: Regular height toilet Toileting - Clothing Manipulation and Hygiene: Modified independent Where Assessed - Toileting Clothing Manipulation and Hygiene: Sit to stand from 3-in-1 or toilet Transfers/Ambulation Related to ADLs: Pt ambulated to bathroom without deficits noted at this time. pt was able to navigate room locate all necessary adl items and carry into  bathroom.  ADL Comments: Pt educated on back precautions, provided handout, and completed morning adls. pt with all education complete. Pt complete bed mobility, grooming, toilet transfer and chair transfer. Educated on car transfer, bed transfer, and changing position every 45 minutes.     OT Diagnosis:    OT Problem List:   OT Treatment Interventions:     OT Goals(Current goals can be found in the care plan section)    Visit Information  Last OT Received On: 05/31/13 Assistance Needed: +1 History of Present Illness: 67 yo male s/p microdisectomy L4-5 L5-S1        Prior Functioning     Home Living Family/patient expects to be discharged to:: Private residence Living Arrangements: Spouse/significant other Available Help at Discharge: Family Type of Home: Mobile home Home Access: Stairs to enter Secretary/administrator of Steps: 5 Home Layout: One level Home Equipment: None Prior Function Level of Independence: Independent Communication Communication: No difficulties Dominant Hand: Right         Vision/Perception Vision - History Baseline Vision: No visual deficits Patient Visual Report: No change from baseline Vision - Assessment Eye Alignment: Within Functional Limits Vision Assessment: Vision not tested   Cognition  Cognition Arousal/Alertness: Awake/alert Behavior During Therapy: WFL for tasks assessed/performed Overall Cognitive Status: Within Functional Limits for tasks assessed    Extremity/Trunk Assessment Upper Extremity Assessment Upper Extremity Assessment: Overall WFL for tasks assessed Lower Extremity Assessment Lower Extremity Assessment: Overall WFL for tasks assessed Cervical / Trunk Assessment Cervical / Trunk Assessment: Normal     Mobility Bed Mobility Bed Mobility: Supine to Sit;Sitting - Scoot to Edge of Bed Supine to Sit: 6: Modified independent (Device/Increase time) Sitting - Scoot to Edge of Bed: 6: Modified independent  (  Device/Increase time) Transfers Transfers: Sit to Stand;Stand to Sit Sit to Stand: 6: Modified independent (Device/Increase time);From bed;With upper extremity assist Stand to Sit: 6: Modified independent (Device/Increase time);To chair/3-in-1;With upper extremity assist     Exercise     Balance     End of Session OT - End of Session Activity Tolerance: Patient tolerated treatment well Patient left: in chair;with call bell/phone within reach Nurse Communication: Precautions  GO     Harrel Carina [facility] 05/31/2013, 9:44 AM Pager: [phone]

## 2013-05-31 NOTE — Progress Notes (Signed)
Pt. Discharge education completed. Prescription for Percocet given to patient. IV's removed. Patient discharged to home.

## 2013-06-01 ENCOUNTER — Encounter (HOSPITAL_COMMUNITY): Payer: Self-pay | Admitting: Neurosurgery

## 2013-08-23 DIAGNOSIS — G4700 Insomnia, unspecified: Secondary | ICD-10-CM | POA: Diagnosis not present

## 2013-08-23 DIAGNOSIS — M159 Polyosteoarthritis, unspecified: Secondary | ICD-10-CM | POA: Diagnosis not present

## 2013-08-23 DIAGNOSIS — F411 Generalized anxiety disorder: Secondary | ICD-10-CM | POA: Diagnosis not present

## 2013-08-23 DIAGNOSIS — Z23 Encounter for immunization: Secondary | ICD-10-CM | POA: Diagnosis not present

## 2013-10-03 DIAGNOSIS — Z9861 Coronary angioplasty status: Secondary | ICD-10-CM | POA: Diagnosis not present

## 2013-10-03 DIAGNOSIS — I2510 Atherosclerotic heart disease of native coronary artery without angina pectoris: Secondary | ICD-10-CM | POA: Diagnosis not present

## 2013-10-03 DIAGNOSIS — E785 Hyperlipidemia, unspecified: Secondary | ICD-10-CM | POA: Diagnosis not present

## 2013-10-03 DIAGNOSIS — I10 Essential (primary) hypertension: Secondary | ICD-10-CM | POA: Diagnosis not present

## 2013-11-13 DIAGNOSIS — J069 Acute upper respiratory infection, unspecified: Secondary | ICD-10-CM | POA: Diagnosis not present

## 2013-11-13 DIAGNOSIS — M109 Gout, unspecified: Secondary | ICD-10-CM | POA: Diagnosis not present

## 2013-11-13 DIAGNOSIS — E785 Hyperlipidemia, unspecified: Secondary | ICD-10-CM | POA: Diagnosis not present

## 2013-11-23 DIAGNOSIS — M109 Gout, unspecified: Secondary | ICD-10-CM | POA: Diagnosis not present

## 2013-11-23 DIAGNOSIS — Z23 Encounter for immunization: Secondary | ICD-10-CM | POA: Diagnosis not present

## 2013-11-23 DIAGNOSIS — E785 Hyperlipidemia, unspecified: Secondary | ICD-10-CM | POA: Diagnosis not present

## 2013-11-23 DIAGNOSIS — M159 Polyosteoarthritis, unspecified: Secondary | ICD-10-CM | POA: Diagnosis not present

## 2013-11-23 DIAGNOSIS — I10 Essential (primary) hypertension: Secondary | ICD-10-CM | POA: Diagnosis not present

## 2013-11-23 DIAGNOSIS — E538 Deficiency of other specified B group vitamins: Secondary | ICD-10-CM | POA: Diagnosis not present

## 2013-11-23 DIAGNOSIS — G4700 Insomnia, unspecified: Secondary | ICD-10-CM | POA: Diagnosis not present

## 2013-12-13 DIAGNOSIS — R350 Frequency of micturition: Secondary | ICD-10-CM | POA: Diagnosis not present

## 2013-12-13 DIAGNOSIS — N529 Male erectile dysfunction, unspecified: Secondary | ICD-10-CM | POA: Diagnosis not present

## 2013-12-13 DIAGNOSIS — N200 Calculus of kidney: Secondary | ICD-10-CM | POA: Diagnosis not present

## 2013-12-18 DIAGNOSIS — R1084 Generalized abdominal pain: Secondary | ICD-10-CM | POA: Diagnosis not present

## 2013-12-18 DIAGNOSIS — N401 Enlarged prostate with lower urinary tract symptoms: Secondary | ICD-10-CM | POA: Diagnosis not present

## 2013-12-18 DIAGNOSIS — N139 Obstructive and reflux uropathy, unspecified: Secondary | ICD-10-CM | POA: Diagnosis not present

## 2013-12-18 DIAGNOSIS — R350 Frequency of micturition: Secondary | ICD-10-CM | POA: Diagnosis not present

## 2013-12-18 DIAGNOSIS — N200 Calculus of kidney: Secondary | ICD-10-CM | POA: Diagnosis not present

## 2013-12-18 DIAGNOSIS — N529 Male erectile dysfunction, unspecified: Secondary | ICD-10-CM | POA: Diagnosis not present

## 2013-12-18 DIAGNOSIS — N138 Other obstructive and reflux uropathy: Secondary | ICD-10-CM | POA: Diagnosis not present

## 2013-12-18 DIAGNOSIS — N281 Cyst of kidney, acquired: Secondary | ICD-10-CM | POA: Diagnosis not present

## 2014-01-23 DIAGNOSIS — Z79899 Other long term (current) drug therapy: Secondary | ICD-10-CM | POA: Diagnosis not present

## 2014-01-23 DIAGNOSIS — Z9181 History of falling: Secondary | ICD-10-CM | POA: Diagnosis not present

## 2014-01-23 DIAGNOSIS — Z1331 Encounter for screening for depression: Secondary | ICD-10-CM | POA: Diagnosis not present

## 2014-01-23 DIAGNOSIS — F329 Major depressive disorder, single episode, unspecified: Secondary | ICD-10-CM | POA: Diagnosis not present

## 2014-01-23 DIAGNOSIS — N289 Disorder of kidney and ureter, unspecified: Secondary | ICD-10-CM | POA: Diagnosis not present

## 2014-01-23 DIAGNOSIS — F411 Generalized anxiety disorder: Secondary | ICD-10-CM | POA: Diagnosis not present

## 2014-01-24 DIAGNOSIS — E663 Overweight: Secondary | ICD-10-CM | POA: Diagnosis not present

## 2014-01-24 DIAGNOSIS — M5126 Other intervertebral disc displacement, lumbar region: Secondary | ICD-10-CM | POA: Diagnosis not present

## 2014-01-24 DIAGNOSIS — I10 Essential (primary) hypertension: Secondary | ICD-10-CM | POA: Diagnosis not present

## 2014-02-01 DIAGNOSIS — M5126 Other intervertebral disc displacement, lumbar region: Secondary | ICD-10-CM | POA: Diagnosis not present

## 2014-02-01 DIAGNOSIS — M48061 Spinal stenosis, lumbar region without neurogenic claudication: Secondary | ICD-10-CM | POA: Diagnosis not present

## 2014-02-01 DIAGNOSIS — Z6829 Body mass index (BMI) 29.0-29.9, adult: Secondary | ICD-10-CM | POA: Diagnosis not present

## 2014-02-01 DIAGNOSIS — M47817 Spondylosis without myelopathy or radiculopathy, lumbosacral region: Secondary | ICD-10-CM | POA: Diagnosis not present

## 2014-02-08 DIAGNOSIS — E876 Hypokalemia: Secondary | ICD-10-CM | POA: Diagnosis not present

## 2014-02-26 ENCOUNTER — Other Ambulatory Visit: Payer: Self-pay | Admitting: Neurosurgery

## 2014-02-26 DIAGNOSIS — M5126 Other intervertebral disc displacement, lumbar region: Secondary | ICD-10-CM

## 2014-03-09 ENCOUNTER — Ambulatory Visit
Admission: RE | Admit: 2014-03-09 | Discharge: 2014-03-09 | Disposition: A | Discharge status: Home / Self Care | Payer: Medicare Other | Source: Ambulatory Visit | Attending: Neurosurgery | Admitting: Neurosurgery

## 2014-03-09 ENCOUNTER — Other Ambulatory Visit: Payer: Self-pay | Admitting: Neurosurgery

## 2014-03-09 DIAGNOSIS — M5126 Other intervertebral disc displacement, lumbar region: Secondary | ICD-10-CM

## 2014-03-09 MED ORDER — IOHEXOL 180 MG/ML  SOLN
1.0000 mL | Freq: Once | INTRAMUSCULAR | Status: AC | PRN
  Administered 2014-03-09: 1 mL via INTRAVENOUS

## 2014-03-09 MED ORDER — METHYLPREDNISOLONE ACETATE 40 MG/ML INJ SUSP (RADIOLOG
120.0000 mg | Freq: Once | INTRAMUSCULAR | Status: AC
  Administered 2014-03-09: 120 mg via EPIDURAL

## 2014-03-09 NOTE — Discharge Instructions (Signed)

## 2014-03-12 DIAGNOSIS — Z9861 Coronary angioplasty status: Secondary | ICD-10-CM | POA: Diagnosis not present

## 2014-03-12 DIAGNOSIS — I2510 Atherosclerotic heart disease of native coronary artery without angina pectoris: Secondary | ICD-10-CM | POA: Diagnosis not present

## 2014-03-12 DIAGNOSIS — I10 Essential (primary) hypertension: Secondary | ICD-10-CM | POA: Diagnosis not present

## 2014-03-12 DIAGNOSIS — E876 Hypokalemia: Secondary | ICD-10-CM | POA: Diagnosis not present

## 2014-03-12 DIAGNOSIS — E785 Hyperlipidemia, unspecified: Secondary | ICD-10-CM | POA: Diagnosis not present

## 2014-03-12 DIAGNOSIS — R5383 Other fatigue: Secondary | ICD-10-CM | POA: Diagnosis not present

## 2014-03-12 DIAGNOSIS — R5381 Other malaise: Secondary | ICD-10-CM | POA: Diagnosis not present

## 2014-03-27 ENCOUNTER — Other Ambulatory Visit: Payer: Self-pay | Admitting: Neurosurgery

## 2014-03-27 DIAGNOSIS — M5126 Other intervertebral disc displacement, lumbar region: Secondary | ICD-10-CM

## 2014-03-28 ENCOUNTER — Other Ambulatory Visit: Payer: Medicare Other

## 2014-03-30 ENCOUNTER — Other Ambulatory Visit: Payer: Medicare Other

## 2014-03-30 ENCOUNTER — Other Ambulatory Visit: Payer: Self-pay | Admitting: Neurosurgery

## 2014-03-30 ENCOUNTER — Ambulatory Visit
Admission: RE | Admit: 2014-03-30 | Discharge: 2014-03-30 | Disposition: A | Discharge status: Home / Self Care | Payer: Medicare Other | Source: Ambulatory Visit | Attending: Neurosurgery | Admitting: Neurosurgery

## 2014-03-30 DIAGNOSIS — M5126 Other intervertebral disc displacement, lumbar region: Secondary | ICD-10-CM

## 2014-03-30 MED ORDER — METHYLPREDNISOLONE ACETATE 40 MG/ML INJ SUSP (RADIOLOG
120.0000 mg | Freq: Once | INTRAMUSCULAR | Status: AC
  Administered 2014-03-30: 120 mg via EPIDURAL

## 2014-03-30 MED ORDER — IOHEXOL 180 MG/ML  SOLN
1.0000 mL | Freq: Once | INTRAMUSCULAR | Status: AC | PRN
  Administered 2014-03-30: 1 mL via EPIDURAL

## 2014-05-08 DIAGNOSIS — E785 Hyperlipidemia, unspecified: Secondary | ICD-10-CM | POA: Diagnosis not present

## 2014-05-08 DIAGNOSIS — G4700 Insomnia, unspecified: Secondary | ICD-10-CM | POA: Diagnosis not present

## 2014-05-08 DIAGNOSIS — Z125 Encounter for screening for malignant neoplasm of prostate: Secondary | ICD-10-CM | POA: Diagnosis not present

## 2014-05-08 DIAGNOSIS — M159 Polyosteoarthritis, unspecified: Secondary | ICD-10-CM | POA: Diagnosis not present

## 2014-05-08 DIAGNOSIS — E538 Deficiency of other specified B group vitamins: Secondary | ICD-10-CM | POA: Diagnosis not present

## 2014-05-08 DIAGNOSIS — I10 Essential (primary) hypertension: Secondary | ICD-10-CM | POA: Diagnosis not present

## 2014-05-17 DIAGNOSIS — M48062 Spinal stenosis, lumbar region with neurogenic claudication: Secondary | ICD-10-CM | POA: Diagnosis not present

## 2014-05-17 DIAGNOSIS — Z6830 Body mass index (BMI) 30.0-30.9, adult: Secondary | ICD-10-CM | POA: Diagnosis not present

## 2014-05-18 DIAGNOSIS — M4712 Other spondylosis with myelopathy, cervical region: Secondary | ICD-10-CM | POA: Diagnosis not present

## 2014-05-18 DIAGNOSIS — M4802 Spinal stenosis, cervical region: Secondary | ICD-10-CM | POA: Diagnosis not present

## 2014-05-18 DIAGNOSIS — M5030 Other cervical disc degeneration, unspecified cervical region: Secondary | ICD-10-CM | POA: Diagnosis not present

## 2014-05-18 DIAGNOSIS — R209 Unspecified disturbances of skin sensation: Secondary | ICD-10-CM | POA: Diagnosis not present

## 2014-05-18 DIAGNOSIS — M5020 Other cervical disc displacement, unspecified cervical region: Secondary | ICD-10-CM | POA: Diagnosis not present

## 2014-05-18 DIAGNOSIS — M47812 Spondylosis without myelopathy or radiculopathy, cervical region: Secondary | ICD-10-CM | POA: Diagnosis not present

## 2014-05-31 DIAGNOSIS — Z6829 Body mass index (BMI) 29.0-29.9, adult: Secondary | ICD-10-CM | POA: Diagnosis not present

## 2014-05-31 DIAGNOSIS — M48062 Spinal stenosis, lumbar region with neurogenic claudication: Secondary | ICD-10-CM | POA: Diagnosis not present

## 2014-06-01 ENCOUNTER — Other Ambulatory Visit: Payer: Self-pay | Admitting: Neurosurgery

## 2014-06-01 DIAGNOSIS — M48061 Spinal stenosis, lumbar region without neurogenic claudication: Secondary | ICD-10-CM

## 2014-06-11 ENCOUNTER — Other Ambulatory Visit: Payer: Self-pay | Admitting: Neurosurgery

## 2014-06-11 ENCOUNTER — Ambulatory Visit
Admission: RE | Admit: 2014-06-11 | Discharge: 2014-06-11 | Disposition: A | Discharge status: Home / Self Care | Payer: Medicare Other | Source: Ambulatory Visit | Attending: Neurosurgery | Admitting: Neurosurgery

## 2014-06-11 DIAGNOSIS — M48061 Spinal stenosis, lumbar region without neurogenic claudication: Secondary | ICD-10-CM

## 2014-06-11 DIAGNOSIS — M545 Low back pain, unspecified: Secondary | ICD-10-CM | POA: Diagnosis not present

## 2014-06-11 MED ORDER — METHYLPREDNISOLONE ACETATE 40 MG/ML INJ SUSP (RADIOLOG
120.0000 mg | Freq: Once | INTRAMUSCULAR | Status: AC
  Administered 2014-06-11: 120 mg via EPIDURAL

## 2014-06-11 MED ORDER — IOHEXOL 180 MG/ML  SOLN
1.0000 mL | Freq: Once | INTRAMUSCULAR | Status: AC | PRN
  Administered 2014-06-11: 1 mL via EPIDURAL

## 2014-06-11 NOTE — Discharge Instructions (Signed)

## 2014-06-28 DIAGNOSIS — M48062 Spinal stenosis, lumbar region with neurogenic claudication: Secondary | ICD-10-CM | POA: Diagnosis not present

## 2014-06-28 DIAGNOSIS — Z6829 Body mass index (BMI) 29.0-29.9, adult: Secondary | ICD-10-CM | POA: Diagnosis not present

## 2014-08-30 ENCOUNTER — Other Ambulatory Visit: Payer: Self-pay | Admitting: Neurosurgery

## 2014-08-30 DIAGNOSIS — M5136 Other intervertebral disc degeneration, lumbar region: Secondary | ICD-10-CM | POA: Diagnosis not present

## 2014-08-30 DIAGNOSIS — M4806 Spinal stenosis, lumbar region: Secondary | ICD-10-CM | POA: Diagnosis not present

## 2014-08-30 DIAGNOSIS — Z6830 Body mass index (BMI) 30.0-30.9, adult: Secondary | ICD-10-CM | POA: Diagnosis not present

## 2014-09-04 DIAGNOSIS — K219 Gastro-esophageal reflux disease without esophagitis: Secondary | ICD-10-CM | POA: Diagnosis not present

## 2014-09-04 DIAGNOSIS — M159 Polyosteoarthritis, unspecified: Secondary | ICD-10-CM | POA: Diagnosis not present

## 2014-09-04 DIAGNOSIS — E785 Hyperlipidemia, unspecified: Secondary | ICD-10-CM | POA: Diagnosis not present

## 2014-09-04 DIAGNOSIS — E538 Deficiency of other specified B group vitamins: Secondary | ICD-10-CM | POA: Diagnosis not present

## 2014-09-04 DIAGNOSIS — N183 Chronic kidney disease, stage 3 (moderate): Secondary | ICD-10-CM | POA: Diagnosis not present

## 2014-09-04 DIAGNOSIS — I10 Essential (primary) hypertension: Secondary | ICD-10-CM | POA: Diagnosis not present

## 2014-09-04 DIAGNOSIS — M109 Gout, unspecified: Secondary | ICD-10-CM | POA: Diagnosis not present

## 2014-09-04 DIAGNOSIS — Z6828 Body mass index (BMI) 28.0-28.9, adult: Secondary | ICD-10-CM | POA: Diagnosis not present

## 2014-09-04 DIAGNOSIS — Z23 Encounter for immunization: Secondary | ICD-10-CM | POA: Diagnosis not present

## 2014-10-18 DIAGNOSIS — M79604 Pain in right leg: Secondary | ICD-10-CM | POA: Diagnosis not present

## 2014-10-18 DIAGNOSIS — R0789 Other chest pain: Secondary | ICD-10-CM | POA: Diagnosis not present

## 2014-10-18 DIAGNOSIS — I10 Essential (primary) hypertension: Secondary | ICD-10-CM | POA: Diagnosis not present

## 2014-10-18 DIAGNOSIS — Z955 Presence of coronary angioplasty implant and graft: Secondary | ICD-10-CM | POA: Diagnosis not present

## 2014-10-18 DIAGNOSIS — E785 Hyperlipidemia, unspecified: Secondary | ICD-10-CM | POA: Diagnosis not present

## 2014-10-18 DIAGNOSIS — M79605 Pain in left leg: Secondary | ICD-10-CM | POA: Diagnosis not present

## 2014-10-18 DIAGNOSIS — I2510 Atherosclerotic heart disease of native coronary artery without angina pectoris: Secondary | ICD-10-CM | POA: Diagnosis not present

## 2014-10-25 DIAGNOSIS — M79604 Pain in right leg: Secondary | ICD-10-CM | POA: Diagnosis not present

## 2014-10-25 DIAGNOSIS — M79605 Pain in left leg: Secondary | ICD-10-CM | POA: Diagnosis not present

## 2014-11-06 ENCOUNTER — Inpatient Hospital Stay (HOSPITAL_COMMUNITY): Admission: RE | Admit: 2014-11-06 | Payer: Medicare Other | Source: Ambulatory Visit | Admitting: Neurosurgery

## 2014-11-06 ENCOUNTER — Encounter (HOSPITAL_COMMUNITY): Admission: RE | Payer: Self-pay | Source: Ambulatory Visit

## 2014-11-06 SURGERY — FOR MAXIMUM ACCESS (MAS) POSTERIOR LUMBAR INTERBODY FUSION (PLIF) 2 LEVEL
Anesthesia: General | Site: Back

## 2015-03-06 DIAGNOSIS — I10 Essential (primary) hypertension: Secondary | ICD-10-CM | POA: Diagnosis not present

## 2015-03-06 DIAGNOSIS — M159 Polyosteoarthritis, unspecified: Secondary | ICD-10-CM | POA: Diagnosis not present

## 2015-03-06 DIAGNOSIS — E538 Deficiency of other specified B group vitamins: Secondary | ICD-10-CM | POA: Diagnosis not present

## 2015-03-06 DIAGNOSIS — J45909 Unspecified asthma, uncomplicated: Secondary | ICD-10-CM | POA: Diagnosis not present

## 2015-03-06 DIAGNOSIS — Z6828 Body mass index (BMI) 28.0-28.9, adult: Secondary | ICD-10-CM | POA: Diagnosis not present

## 2015-03-06 DIAGNOSIS — E785 Hyperlipidemia, unspecified: Secondary | ICD-10-CM | POA: Diagnosis not present

## 2015-03-06 DIAGNOSIS — F411 Generalized anxiety disorder: Secondary | ICD-10-CM | POA: Diagnosis not present

## 2015-03-06 DIAGNOSIS — K219 Gastro-esophageal reflux disease without esophagitis: Secondary | ICD-10-CM | POA: Diagnosis not present

## 2015-03-06 DIAGNOSIS — Z79899 Other long term (current) drug therapy: Secondary | ICD-10-CM | POA: Diagnosis not present

## 2015-06-25 DIAGNOSIS — T1511XA Foreign body in conjunctival sac, right eye, initial encounter: Secondary | ICD-10-CM | POA: Diagnosis not present

## 2015-07-17 DIAGNOSIS — N401 Enlarged prostate with lower urinary tract symptoms: Secondary | ICD-10-CM | POA: Diagnosis not present

## 2015-07-19 DIAGNOSIS — C44311 Basal cell carcinoma of skin of nose: Secondary | ICD-10-CM | POA: Diagnosis not present

## 2015-07-19 DIAGNOSIS — L578 Other skin changes due to chronic exposure to nonionizing radiation: Secondary | ICD-10-CM | POA: Diagnosis not present

## 2015-07-19 DIAGNOSIS — L820 Inflamed seborrheic keratosis: Secondary | ICD-10-CM | POA: Diagnosis not present

## 2015-09-05 DIAGNOSIS — N183 Chronic kidney disease, stage 3 (moderate): Secondary | ICD-10-CM | POA: Diagnosis not present

## 2015-09-05 DIAGNOSIS — F411 Generalized anxiety disorder: Secondary | ICD-10-CM | POA: Diagnosis not present

## 2015-09-05 DIAGNOSIS — I10 Essential (primary) hypertension: Secondary | ICD-10-CM | POA: Diagnosis not present

## 2015-09-05 DIAGNOSIS — F5104 Psychophysiologic insomnia: Secondary | ICD-10-CM | POA: Diagnosis not present

## 2015-09-05 DIAGNOSIS — M159 Polyosteoarthritis, unspecified: Secondary | ICD-10-CM | POA: Diagnosis not present

## 2015-09-05 DIAGNOSIS — Z6827 Body mass index (BMI) 27.0-27.9, adult: Secondary | ICD-10-CM | POA: Diagnosis not present

## 2015-09-05 DIAGNOSIS — Z79899 Other long term (current) drug therapy: Secondary | ICD-10-CM | POA: Diagnosis not present

## 2015-09-05 DIAGNOSIS — Z23 Encounter for immunization: Secondary | ICD-10-CM | POA: Diagnosis not present

## 2015-09-05 DIAGNOSIS — E785 Hyperlipidemia, unspecified: Secondary | ICD-10-CM | POA: Diagnosis not present

## 2015-10-14 ENCOUNTER — Encounter: Payer: Self-pay | Admitting: Podiatry

## 2015-10-14 ENCOUNTER — Ambulatory Visit (INDEPENDENT_AMBULATORY_CARE_PROVIDER_SITE_OTHER): Payer: Medicare Other | Admitting: Podiatry

## 2015-10-14 ENCOUNTER — Ambulatory Visit (INDEPENDENT_AMBULATORY_CARE_PROVIDER_SITE_OTHER): Payer: Medicare Other

## 2015-10-14 VITALS — BP 132/80 | HR 54 | Resp 14

## 2015-10-14 DIAGNOSIS — M129 Arthropathy, unspecified: Secondary | ICD-10-CM

## 2015-10-14 DIAGNOSIS — R52 Pain, unspecified: Secondary | ICD-10-CM

## 2015-10-14 DIAGNOSIS — M19071 Primary osteoarthritis, right ankle and foot: Secondary | ICD-10-CM

## 2015-10-14 MED ORDER — MELOXICAM 15 MG PO TABS: 15.0000 mg | ORAL_TABLET | Freq: Every day | ORAL | Status: DC

## 2015-10-14 NOTE — Progress Notes (Signed)
Subjective:     Patient ID: Aaron Houston, male   DOB: November 02, 1946, 69 y.o.   MRN: [id number]  HPI this patient presents to the office with severe pain noted on the top of his right foot. He states the pain is very tender to the touch and he is having difficulty wearing footgear. Due to the pain on the top of his right foot. He previously had surgery performed by Dr. Amalia Hailey for bunion on his right foot. He also says he has had a similar problem to his ankle, which was treated successfully with injection therapy and medication. He was treated at that time by Dr. Valentina Lucks. He presents to the office today for an evaluation and treatment of this condition   Review of Systems     Objective:   Physical Exam GENERAL APPEARANCE: Alert, conversant. Appropriately groomed. No acute distress.  VASCULAR: Pedal pulses palpable at  [facility] and PT bilateral.  Capillary refill time is immediate to all digits,  Normal temperature gradient.  Digital hair growth is present bilateral  NEUROLOGIC: sensation is normal to 5.07 monofilament at 5/5 sites bilateral.  Light touch is intact bilateral, Muscle strength normal.  MUSCULOSKELETAL: acceptable muscle strength, tone and stability bilateral.  Intrinsic muscluature intact bilateral.  Rectus appearance of foot and digits noted bilateral. Red swollen 1st MCJ right foot.  Severe pain elicited on ROM 1st MPJ right foot due to EDL tendinitis.  DERMATOLOGIC: skin color, texture, and turgor are within normal limits.  No preulcerative lesions or ulcers  are seen, no interdigital maceration noted.  No open lesions present.  Digital nails are asymptomatic. No drainage noted.      Assessment:     Bursitis right foot  EDL tendinitis right foot  Arthritis right foot.     Plan:     ROV  X-rays taken reveals dorsal arthritis.  Injection therapy right foot.  Prescribed Mobic.  RTC prn  Gardiner Barefoot DPM

## 2016-02-18 ENCOUNTER — Other Ambulatory Visit: Payer: Self-pay | Admitting: *Deleted

## 2016-02-18 MED ORDER — MELOXICAM 15 MG PO TABS: 15.0000 mg | ORAL_TABLET | Freq: Every day | ORAL | Status: DC

## 2016-02-19 ENCOUNTER — Telehealth: Payer: Self-pay | Admitting: *Deleted

## 2016-02-19 MED ORDER — MELOXICAM 15 MG PO TABS: 15.0000 mg | ORAL_TABLET | Freq: Every day | ORAL | Status: DC

## 2016-02-19 NOTE — Telephone Encounter (Signed)
Dr. Prudence Davidson ordered refill Mobic as previously once, but needs to be seen if problem continues.

## 2016-09-02 ENCOUNTER — Other Ambulatory Visit: Payer: Self-pay | Admitting: Neurosurgery

## 2016-11-11 ENCOUNTER — Inpatient Hospital Stay (HOSPITAL_COMMUNITY): Admission: RE | Admit: 2016-11-11 | Payer: Medicare Other | Source: Ambulatory Visit

## 2016-11-16 ENCOUNTER — Encounter (HOSPITAL_COMMUNITY): Admission: RE | Payer: Self-pay | Source: Ambulatory Visit

## 2016-11-16 ENCOUNTER — Inpatient Hospital Stay (HOSPITAL_COMMUNITY): Admission: RE | Admit: 2016-11-16 | Payer: PPO | Source: Ambulatory Visit | Admitting: Neurosurgery

## 2016-11-16 SURGERY — POSTERIOR LUMBAR FUSION 2 LEVEL
Anesthesia: General | Site: Back

## 2016-12-01 DIAGNOSIS — Z6828 Body mass index (BMI) 28.0-28.9, adult: Secondary | ICD-10-CM | POA: Diagnosis not present

## 2016-12-01 DIAGNOSIS — J069 Acute upper respiratory infection, unspecified: Secondary | ICD-10-CM | POA: Diagnosis not present

## 2016-12-01 DIAGNOSIS — M159 Polyosteoarthritis, unspecified: Secondary | ICD-10-CM | POA: Diagnosis not present

## 2016-12-01 DIAGNOSIS — R05 Cough: Secondary | ICD-10-CM | POA: Diagnosis not present

## 2016-12-01 DIAGNOSIS — Z79899 Other long term (current) drug therapy: Secondary | ICD-10-CM | POA: Diagnosis not present

## 2016-12-01 DIAGNOSIS — J45909 Unspecified asthma, uncomplicated: Secondary | ICD-10-CM | POA: Diagnosis not present

## 2017-01-21 DIAGNOSIS — Z6828 Body mass index (BMI) 28.0-28.9, adult: Secondary | ICD-10-CM | POA: Diagnosis not present

## 2017-01-21 DIAGNOSIS — J45909 Unspecified asthma, uncomplicated: Secondary | ICD-10-CM | POA: Diagnosis not present

## 2017-01-21 DIAGNOSIS — Z139 Encounter for screening, unspecified: Secondary | ICD-10-CM | POA: Diagnosis not present

## 2017-01-21 DIAGNOSIS — Z79899 Other long term (current) drug therapy: Secondary | ICD-10-CM | POA: Diagnosis not present

## 2017-01-21 DIAGNOSIS — K2960 Other gastritis without bleeding: Secondary | ICD-10-CM | POA: Diagnosis not present

## 2017-02-09 DIAGNOSIS — R1084 Generalized abdominal pain: Secondary | ICD-10-CM | POA: Diagnosis not present

## 2017-02-09 DIAGNOSIS — R634 Abnormal weight loss: Secondary | ICD-10-CM | POA: Diagnosis not present

## 2017-02-09 DIAGNOSIS — R112 Nausea with vomiting, unspecified: Secondary | ICD-10-CM | POA: Diagnosis not present

## 2017-02-09 DIAGNOSIS — R140 Abdominal distension (gaseous): Secondary | ICD-10-CM | POA: Diagnosis not present

## 2017-02-09 DIAGNOSIS — R0602 Shortness of breath: Secondary | ICD-10-CM | POA: Diagnosis not present

## 2017-02-12 DIAGNOSIS — I2510 Atherosclerotic heart disease of native coronary artery without angina pectoris: Secondary | ICD-10-CM | POA: Diagnosis not present

## 2017-02-12 DIAGNOSIS — Z955 Presence of coronary angioplasty implant and graft: Secondary | ICD-10-CM | POA: Diagnosis not present

## 2017-02-12 DIAGNOSIS — E785 Hyperlipidemia, unspecified: Secondary | ICD-10-CM | POA: Diagnosis not present

## 2017-02-12 DIAGNOSIS — I10 Essential (primary) hypertension: Secondary | ICD-10-CM | POA: Diagnosis not present

## 2017-02-12 DIAGNOSIS — R0602 Shortness of breath: Secondary | ICD-10-CM | POA: Diagnosis not present

## 2017-02-12 DIAGNOSIS — R0789 Other chest pain: Secondary | ICD-10-CM | POA: Diagnosis not present

## 2017-02-12 DIAGNOSIS — R000 Tachycardia, unspecified: Secondary | ICD-10-CM | POA: Diagnosis not present

## 2017-02-15 DIAGNOSIS — R112 Nausea with vomiting, unspecified: Secondary | ICD-10-CM | POA: Diagnosis not present

## 2017-02-15 DIAGNOSIS — M5137 Other intervertebral disc degeneration, lumbosacral region: Secondary | ICD-10-CM | POA: Diagnosis not present

## 2017-02-15 DIAGNOSIS — R1110 Vomiting, unspecified: Secondary | ICD-10-CM | POA: Diagnosis not present

## 2017-02-15 DIAGNOSIS — K5730 Diverticulosis of large intestine without perforation or abscess without bleeding: Secondary | ICD-10-CM | POA: Diagnosis not present

## 2017-02-15 DIAGNOSIS — R634 Abnormal weight loss: Secondary | ICD-10-CM | POA: Diagnosis not present

## 2017-02-15 DIAGNOSIS — K760 Fatty (change of) liver, not elsewhere classified: Secondary | ICD-10-CM | POA: Diagnosis not present

## 2017-02-15 DIAGNOSIS — N200 Calculus of kidney: Secondary | ICD-10-CM | POA: Diagnosis not present

## 2017-02-15 DIAGNOSIS — R109 Unspecified abdominal pain: Secondary | ICD-10-CM | POA: Diagnosis not present

## 2017-02-24 DIAGNOSIS — R0789 Other chest pain: Secondary | ICD-10-CM | POA: Diagnosis not present

## 2017-02-24 DIAGNOSIS — I2510 Atherosclerotic heart disease of native coronary artery without angina pectoris: Secondary | ICD-10-CM | POA: Diagnosis not present

## 2017-02-24 DIAGNOSIS — R0602 Shortness of breath: Secondary | ICD-10-CM | POA: Diagnosis not present

## 2017-04-01 DIAGNOSIS — R112 Nausea with vomiting, unspecified: Secondary | ICD-10-CM | POA: Diagnosis not present

## 2017-04-01 DIAGNOSIS — R1013 Epigastric pain: Secondary | ICD-10-CM | POA: Diagnosis not present

## 2017-04-01 DIAGNOSIS — K760 Fatty (change of) liver, not elsewhere classified: Secondary | ICD-10-CM | POA: Diagnosis not present

## 2017-04-01 DIAGNOSIS — R634 Abnormal weight loss: Secondary | ICD-10-CM | POA: Diagnosis not present

## 2017-04-05 DIAGNOSIS — Z79899 Other long term (current) drug therapy: Secondary | ICD-10-CM | POA: Diagnosis not present

## 2017-04-05 DIAGNOSIS — R634 Abnormal weight loss: Secondary | ICD-10-CM | POA: Diagnosis not present

## 2017-04-05 DIAGNOSIS — K2970 Gastritis, unspecified, without bleeding: Secondary | ICD-10-CM | POA: Diagnosis not present

## 2017-04-05 DIAGNOSIS — K2900 Acute gastritis without bleeding: Secondary | ICD-10-CM | POA: Diagnosis not present

## 2017-04-05 DIAGNOSIS — R1013 Epigastric pain: Secondary | ICD-10-CM | POA: Diagnosis not present

## 2017-04-05 DIAGNOSIS — K219 Gastro-esophageal reflux disease without esophagitis: Secondary | ICD-10-CM | POA: Diagnosis not present

## 2017-04-05 DIAGNOSIS — F419 Anxiety disorder, unspecified: Secondary | ICD-10-CM | POA: Diagnosis not present

## 2017-04-05 DIAGNOSIS — K3189 Other diseases of stomach and duodenum: Secondary | ICD-10-CM | POA: Diagnosis not present

## 2017-04-05 DIAGNOSIS — R112 Nausea with vomiting, unspecified: Secondary | ICD-10-CM | POA: Diagnosis not present

## 2017-04-05 DIAGNOSIS — F329 Major depressive disorder, single episode, unspecified: Secondary | ICD-10-CM | POA: Diagnosis not present

## 2017-04-05 DIAGNOSIS — E7800 Pure hypercholesterolemia, unspecified: Secondary | ICD-10-CM | POA: Diagnosis not present

## 2017-04-05 DIAGNOSIS — K760 Fatty (change of) liver, not elsewhere classified: Secondary | ICD-10-CM | POA: Diagnosis not present

## 2017-04-05 HISTORY — PX: UPPER GI ENDOSCOPY: SHX6162

## 2017-04-12 DIAGNOSIS — R0602 Shortness of breath: Secondary | ICD-10-CM | POA: Diagnosis not present

## 2017-04-12 DIAGNOSIS — I2510 Atherosclerotic heart disease of native coronary artery without angina pectoris: Secondary | ICD-10-CM | POA: Diagnosis not present

## 2017-06-22 DIAGNOSIS — Z6827 Body mass index (BMI) 27.0-27.9, adult: Secondary | ICD-10-CM | POA: Diagnosis not present

## 2017-06-22 DIAGNOSIS — I10 Essential (primary) hypertension: Secondary | ICD-10-CM | POA: Diagnosis not present

## 2017-06-22 DIAGNOSIS — M159 Polyosteoarthritis, unspecified: Secondary | ICD-10-CM | POA: Diagnosis not present

## 2017-06-30 DIAGNOSIS — J45909 Unspecified asthma, uncomplicated: Secondary | ICD-10-CM | POA: Diagnosis not present

## 2017-06-30 DIAGNOSIS — N183 Chronic kidney disease, stage 3 (moderate): Secondary | ICD-10-CM | POA: Diagnosis not present

## 2017-06-30 DIAGNOSIS — E538 Deficiency of other specified B group vitamins: Secondary | ICD-10-CM | POA: Diagnosis not present

## 2017-06-30 DIAGNOSIS — E785 Hyperlipidemia, unspecified: Secondary | ICD-10-CM | POA: Diagnosis not present

## 2017-06-30 DIAGNOSIS — K219 Gastro-esophageal reflux disease without esophagitis: Secondary | ICD-10-CM | POA: Diagnosis not present

## 2017-06-30 DIAGNOSIS — G4700 Insomnia, unspecified: Secondary | ICD-10-CM | POA: Diagnosis not present

## 2017-06-30 DIAGNOSIS — M159 Polyosteoarthritis, unspecified: Secondary | ICD-10-CM | POA: Diagnosis not present

## 2017-06-30 DIAGNOSIS — Z6826 Body mass index (BMI) 26.0-26.9, adult: Secondary | ICD-10-CM | POA: Diagnosis not present

## 2017-06-30 DIAGNOSIS — Z79899 Other long term (current) drug therapy: Secondary | ICD-10-CM | POA: Diagnosis not present

## 2017-06-30 DIAGNOSIS — Z131 Encounter for screening for diabetes mellitus: Secondary | ICD-10-CM | POA: Diagnosis not present

## 2017-06-30 DIAGNOSIS — I10 Essential (primary) hypertension: Secondary | ICD-10-CM | POA: Diagnosis not present

## 2017-07-06 DIAGNOSIS — I10 Essential (primary) hypertension: Secondary | ICD-10-CM | POA: Diagnosis not present

## 2017-07-06 DIAGNOSIS — Z6825 Body mass index (BMI) 25.0-25.9, adult: Secondary | ICD-10-CM | POA: Diagnosis not present

## 2017-07-19 DIAGNOSIS — I10 Essential (primary) hypertension: Secondary | ICD-10-CM | POA: Diagnosis not present

## 2017-07-19 DIAGNOSIS — Z955 Presence of coronary angioplasty implant and graft: Secondary | ICD-10-CM | POA: Diagnosis not present

## 2017-07-19 DIAGNOSIS — E785 Hyperlipidemia, unspecified: Secondary | ICD-10-CM | POA: Diagnosis not present

## 2017-07-19 DIAGNOSIS — I2510 Atherosclerotic heart disease of native coronary artery without angina pectoris: Secondary | ICD-10-CM | POA: Diagnosis not present

## 2017-08-19 DIAGNOSIS — M159 Polyosteoarthritis, unspecified: Secondary | ICD-10-CM | POA: Diagnosis not present

## 2017-08-19 DIAGNOSIS — Z23 Encounter for immunization: Secondary | ICD-10-CM | POA: Diagnosis not present

## 2017-08-19 DIAGNOSIS — Z6825 Body mass index (BMI) 25.0-25.9, adult: Secondary | ICD-10-CM | POA: Diagnosis not present

## 2017-08-19 DIAGNOSIS — F5104 Psychophysiologic insomnia: Secondary | ICD-10-CM | POA: Diagnosis not present

## 2017-08-19 DIAGNOSIS — K2970 Gastritis, unspecified, without bleeding: Secondary | ICD-10-CM | POA: Diagnosis not present

## 2017-09-09 DIAGNOSIS — M5126 Other intervertebral disc displacement, lumbar region: Secondary | ICD-10-CM | POA: Diagnosis not present

## 2017-10-04 DIAGNOSIS — M5126 Other intervertebral disc displacement, lumbar region: Secondary | ICD-10-CM | POA: Diagnosis not present

## 2017-10-07 ENCOUNTER — Other Ambulatory Visit: Payer: Self-pay | Admitting: Neurosurgery

## 2017-10-07 DIAGNOSIS — M48062 Spinal stenosis, lumbar region with neurogenic claudication: Secondary | ICD-10-CM | POA: Diagnosis not present

## 2017-10-07 DIAGNOSIS — M5126 Other intervertebral disc displacement, lumbar region: Secondary | ICD-10-CM | POA: Diagnosis not present

## 2017-10-07 DIAGNOSIS — M48061 Spinal stenosis, lumbar region without neurogenic claudication: Secondary | ICD-10-CM | POA: Diagnosis not present

## 2017-10-20 NOTE — Pre-Procedure Instructions (Signed)
Aaron Houston  10/20/2017      [facility] - [place], [place] [address] [place] [address] Phone: [phone] Fax: [phone]    Your procedure is scheduled on December 18  Report to [place] at 0730 A.M.  Call this number if you have problems the morning of surgery:  [phone]   Remember:  Do not eat food or drink liquids after midnight.  Continue all medications as directed by your physician except follow these medication instructions before surgery below   Take these medicines the morning of surgery with A SIP OF WATER NONE  atenolol (TENORMIN) - if taken at night make sure you take the night before surgery   7 days prior to surgery STOP taking any Aspirin(unless otherwise instructed by your surgeon), Aleve, Naproxen, Ibuprofen, Motrin, Advil, Goody's, BC's, all herbal medications, fish oil, and all vitamins   Do not wear jewelry  Do not wear lotions, powders, or cologne, or deodorant.  Men may shave face and neck.  Do not bring valuables to the hospital.  [facility] is not responsible for any belongings or valuables.  Contacts, dentures or bridgework may not be worn into surgery.  Leave your suitcase in the car.  After surgery it may be brought to your room.  For patients admitted to the hospital, discharge time will be determined by your treatment team.  Patients discharged the day of surgery will not be allowed to drive home.    Special instructions:   [place]- Preparing For Surgery  Before surgery, you can play an important role. Because skin is not sterile, your skin needs to be as free of germs as possible. You can reduce the number of germs on your skin by washing with CHG (chlorahexidine gluconate) Soap before surgery.  CHG is an antiseptic cleaner which kills germs and bonds with the skin to continue killing germs even after washing.  Please do not use if you have an allergy to CHG or  antibacterial soaps. If your skin becomes reddened/irritated stop using the CHG.  Do not shave (including legs and underarms) for at least 48 hours prior to first CHG shower. It is OK to shave your face.  Please follow these instructions carefully.   1. Shower the NIGHT BEFORE SURGERY and the MORNING OF SURGERY with CHG.   2. If you chose to wash your hair, wash your hair first as usual with your normal shampoo.  3. After you shampoo, rinse your hair and body thoroughly to remove the shampoo.  4. Use CHG as you would any other liquid soap. You can apply CHG directly to the skin and wash gently with a scrungie or a clean washcloth.   5. Apply the CHG Soap to your body ONLY FROM THE NECK DOWN.  Do not use on open wounds or open sores. Avoid contact with your eyes, ears, mouth and genitals (private parts). Wash Face and genitals (private parts)  with your normal soap.  6. Wash thoroughly, paying special attention to the area where your surgery will be performed.  7. Thoroughly rinse your body with warm water from the neck down.  8. DO NOT shower/wash with your normal soap after using and rinsing off the CHG Soap.  9. Pat yourself dry with a CLEAN TOWEL.  10. Wear CLEAN PAJAMAS to bed the night before surgery, wear comfortable clothes the morning of surgery  11. Place CLEAN SHEETS on your  bed the night of your first shower and DO NOT SLEEP WITH PETS.    Day of Surgery: Do not apply any deodorants/lotions. Please wear clean clothes to the hospital/surgery center.      Please read over the following fact sheets that you were given.

## 2017-10-21 ENCOUNTER — Other Ambulatory Visit: Payer: Self-pay

## 2017-10-21 ENCOUNTER — Encounter (HOSPITAL_COMMUNITY): Payer: Self-pay

## 2017-10-21 ENCOUNTER — Encounter (HOSPITAL_COMMUNITY)
Admission: RE | Admit: 2017-10-21 | Discharge: 2017-10-21 | Disposition: A | Discharge status: Home / Self Care | Payer: PPO | Source: Ambulatory Visit | Attending: Neurosurgery | Admitting: Neurosurgery

## 2017-10-21 DIAGNOSIS — Z01818 Encounter for other preprocedural examination: Secondary | ICD-10-CM | POA: Insufficient documentation

## 2017-10-21 DIAGNOSIS — M48061 Spinal stenosis, lumbar region without neurogenic claudication: Secondary | ICD-10-CM | POA: Insufficient documentation

## 2017-10-21 HISTORY — DX: Personal history of other diseases of the digestive system: Z87.19

## 2017-10-21 HISTORY — DX: Dyspnea, unspecified: R06.00

## 2017-10-21 HISTORY — DX: Persons encountering health services in other specified circumstances: Z76.89

## 2017-10-21 HISTORY — DX: Unspecified osteoarthritis, unspecified site: M19.90

## 2017-10-21 HISTORY — DX: Myoneural disorder, unspecified: G70.9

## 2017-10-21 HISTORY — DX: Gastro-esophageal reflux disease without esophagitis: K21.9

## 2017-10-21 HISTORY — DX: Personal history of urinary calculi: Z87.442

## 2017-10-21 HISTORY — DX: Atherosclerotic heart disease of native coronary artery without angina pectoris: I25.10

## 2017-10-21 HISTORY — DX: Malignant (primary) neoplasm, unspecified: C80.1

## 2017-10-21 HISTORY — DX: Essential (primary) hypertension: I10

## 2017-10-21 HISTORY — DX: Personal history of other medical treatment: Z92.89

## 2017-10-21 LAB — BASIC METABOLIC PANEL
ANION GAP: 10 (ref 5–15)
BUN: 34 mg/dL — ABNORMAL HIGH (ref 6–20)
CALCIUM: 9.1 mg/dL (ref 8.9–10.3)
CO2: 23 mmol/L (ref 22–32)
CREATININE: 1.88 mg/dL — AB (ref 0.61–1.24)
Chloride: 107 mmol/L (ref 101–111)
GFR, EST AFRICAN AMERICAN: 39 mL/min — AB (ref >60)
GFR, EST NON AFRICAN AMERICAN: 34 mL/min — AB (ref >60)
Glucose, Bld: 88 mg/dL (ref 65–99)
Potassium: 3.8 mmol/L (ref 3.5–5.1)
SODIUM: 140 mmol/L (ref 135–145)

## 2017-10-21 LAB — CBC WITH DIFFERENTIAL/PLATELET
BASOS ABS: 0.1 10*3/uL (ref 0.0–0.1)
BASOS PCT: 1 %
Eosinophils Absolute: 0.2 10*3/uL (ref 0.0–0.7)
Eosinophils Relative: 3 %
HEMATOCRIT: 41.8 % (ref 39.0–52.0)
Hemoglobin: 13.6 g/dL (ref 13.0–17.0)
LYMPHS PCT: 28 %
Lymphs Abs: 2 10*3/uL (ref 0.7–4.0)
MCH: 32.5 pg (ref 26.0–34.0)
MCHC: 32.5 g/dL (ref 30.0–36.0)
MCV: 100 fL (ref 78.0–100.0)
Monocytes Absolute: 0.9 10*3/uL (ref 0.1–1.0)
Monocytes Relative: 12 %
NEUTROS ABS: 4.1 10*3/uL (ref 1.7–7.7)
NEUTROS PCT: 56 %
Platelets: 244 10*3/uL (ref 150–400)
RBC: 4.18 MIL/uL — ABNORMAL LOW (ref 4.22–5.81)
RDW: 14.4 % (ref 11.5–15.5)
WBC: 7.3 10*3/uL (ref 4.0–10.5)

## 2017-10-21 LAB — TYPE AND SCREEN
ABO/RH(D): AB POS
ANTIBODY SCREEN: NEGATIVE

## 2017-10-21 LAB — SURGICAL PCR SCREEN
MRSA, PCR: NEGATIVE
STAPHYLOCOCCUS AUREUS: POSITIVE — AB

## 2017-10-21 NOTE — Pre-Procedure Instructions (Signed)
Aaron Houston  10/21/2017      [name] - [place], [place] [address] Phone: [phone] Fax: [phone]    Your procedure is scheduled on December 18  Report to [place] at 0730 A.M.  Call this number if you have problems the morning of surgery:  [phone]   Remember:  Do not eat food or drink liquids after midnight.  Continue all medications as directed by your physician except follow these medication instructions before surgery below   Take these medicines the morning of surgery with A SIP OF WATER NONE  atenolol (TENORMIN) - if taken at night make sure you take the night before surgery, Xanax, Zantac    7 days prior to surgery STOP taking any Aspirin(unless otherwise instructed by your surgeon), Diclofenac, Aleve, Naproxen, Ibuprofen, Motrin, Advil, Goody's, BC's, all herbal medications, fish oil, and all vitamins   Do not wear jewelry  Do not wear lotions, powders, or cologne, or deodorant.  Men may shave face and neck.  Do not bring valuables to the hospital.  [facility] is not responsible for any belongings or valuables.  Contacts, dentures or bridgework may not be worn into surgery.  Leave your suitcase in the car.  After surgery it may be brought to your room.  For patients admitted to the hospital, discharge time will be determined by your treatment team.  Patients discharged the day of surgery will not be allowed to drive home.    Special instructions:   [place]- Preparing For Surgery  Before surgery, you can play an important role. Because skin is not sterile, your skin needs to be as free of germs as possible. You can reduce the number of germs on your skin by washing with CHG (chlorahexidine gluconate) Soap before surgery.  CHG is an antiseptic cleaner which kills germs and bonds with the skin to continue killing germs even after washing.  Please do not use if  you have an allergy to CHG or antibacterial soaps. If your skin becomes reddened/irritated stop using the CHG.  Do not shave (including legs and underarms) for at least 48 hours prior to first CHG shower. It is OK to shave your face.  Please follow these instructions carefully.   1. Shower the NIGHT BEFORE SURGERY and the MORNING OF SURGERY with CHG.   2. If you chose to wash your hair, wash your hair first as usual with your normal shampoo.  3. After you shampoo, rinse your hair and body thoroughly to remove the shampoo.  4. Use CHG as you would any other liquid soap. You can apply CHG directly to the skin and wash gently with a scrungie or a clean washcloth.   5. Apply the CHG Soap to your body ONLY FROM THE NECK DOWN.  Do not use on open wounds or open sores. Avoid contact with your eyes, ears, mouth and genitals (private parts). Wash Face and genitals (private parts)  with your normal soap.  6. Wash thoroughly, paying special attention to the area where your surgery will be performed.  7. Thoroughly rinse your body with warm water from the neck down.  8. DO NOT shower/wash with your normal soap after using and rinsing off the CHG Soap.  9. Pat yourself dry with a CLEAN TOWEL.  10. Wear CLEAN PAJAMAS to bed the night before surgery, wear comfortable clothes the morning of surgery  11. Place  CLEAN SHEETS on your bed the night of your first shower and DO NOT SLEEP WITH PETS.    Day of Surgery: Do not apply any deodorants/lotions. Please wear clean clothes to the hospital/surgery center.      Please read over the following fact sheets that you were given.

## 2017-10-21 NOTE — Progress Notes (Signed)
PCP- Megan Salon in [place], also followed by Dr. Otho Perl for cardiac- testing done in 2018-records in care everywhere. EKG tracing requested from [facility]. Pt. Denies chest/flu concerns.

## 2017-10-22 ENCOUNTER — Encounter (HOSPITAL_COMMUNITY): Payer: Self-pay

## 2017-10-22 NOTE — Progress Notes (Addendum)
Anesthesia Chart Review:  Pt is a 71 year old male scheduled for L4-5, L5-S1 PLIF on 10/26/2017 with Earnie Larsson, MD  - PCP is Jenean Lindau, MD in [place] is Abran Richard, MD. Last office visit 07/19/17 (notes in care everywhere)   PMH includes:  CAD (s/p diagonal stents 2014), HTN, CKD, GERD. Never smoker. BMI 32. S/p lumbar laminectomy 05/29/13.   Medications include: atenolol, zantac  BP 140/79   Pulse (!) 54   Temp (!) 36.3 C   Resp 20   Ht 5\' 4"  (1.626 m)   Wt 188 lb 6.4 oz (85.5 kg)   SpO2 100%   BMI 32.34 kg/m   Preoperative labs reviewed.   -  Cr 1.88, BUN 34. Previously Cr was 1.56 at PCP's office in August.  Pt has CKD stage 3 that is followed by PCP.  Will route lab results to PCP for f/u.   EKG 02/12/17 (Dr. Sudie Grumbling office): Sinus tachycardia (107 bpm). Low voltage in precordial leads. Old inferior infarct.   Echo 04/12/17 (Dr. Sudie Grumbling office):  1. Normal LV systolic function 2. LVEF estimated at 55-60% 3. Trace mitral and tricuspid regurgitation 4. Aortic valve sclerosis without stenosis 5. Mild LVH  Nuclear stress test 02/24/17 (care everywhere): - Well tolerated Lexiscan perfusion stress test, with no clinical ischemia during vasodilator stress. 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 - Gated imaging shows Normal wall motion with an ejection fraction of 59%  If no changes, I anticipate pt can proceed with surgery as scheduled.   Willeen Cass, FNP-BC [facility]/Anesthesiology Phone: [phone] 10/25/2017 2:00 PM

## 2017-10-26 ENCOUNTER — Encounter (HOSPITAL_COMMUNITY)
Admission: RE | Disposition: A | Discharge status: Home / Self Care | Payer: Self-pay | Source: Ambulatory Visit | Attending: Neurosurgery

## 2017-10-26 ENCOUNTER — Encounter (HOSPITAL_COMMUNITY): Payer: Self-pay | Admitting: General Practice

## 2017-10-26 ENCOUNTER — Inpatient Hospital Stay (HOSPITAL_COMMUNITY)
Admission: RE | Admit: 2017-10-26 | Discharge: 2017-10-27 | DRG: 455 | Disposition: A | Discharge status: Home / Self Care | Payer: PPO | Source: Ambulatory Visit | Attending: Neurosurgery | Admitting: Neurosurgery

## 2017-10-26 ENCOUNTER — Inpatient Hospital Stay (HOSPITAL_COMMUNITY): Payer: PPO

## 2017-10-26 ENCOUNTER — Inpatient Hospital Stay (HOSPITAL_COMMUNITY): Payer: PPO | Admitting: Emergency Medicine

## 2017-10-26 ENCOUNTER — Inpatient Hospital Stay (HOSPITAL_COMMUNITY): Payer: PPO | Admitting: Certified Registered Nurse Anesthetist

## 2017-10-26 DIAGNOSIS — Z885 Allergy status to narcotic agent status: Secondary | ICD-10-CM | POA: Diagnosis not present

## 2017-10-26 DIAGNOSIS — M19041 Primary osteoarthritis, right hand: Secondary | ICD-10-CM | POA: Diagnosis not present

## 2017-10-26 DIAGNOSIS — M19042 Primary osteoarthritis, left hand: Secondary | ICD-10-CM | POA: Diagnosis present

## 2017-10-26 DIAGNOSIS — M549 Dorsalgia, unspecified: Secondary | ICD-10-CM | POA: Diagnosis present

## 2017-10-26 DIAGNOSIS — I2510 Atherosclerotic heart disease of native coronary artery without angina pectoris: Secondary | ICD-10-CM | POA: Diagnosis present

## 2017-10-26 DIAGNOSIS — M48061 Spinal stenosis, lumbar region without neurogenic claudication: Secondary | ICD-10-CM | POA: Diagnosis present

## 2017-10-26 DIAGNOSIS — M5127 Other intervertebral disc displacement, lumbosacral region: Secondary | ICD-10-CM | POA: Diagnosis not present

## 2017-10-26 DIAGNOSIS — M5137 Other intervertebral disc degeneration, lumbosacral region: Secondary | ICD-10-CM | POA: Diagnosis not present

## 2017-10-26 DIAGNOSIS — M4326 Fusion of spine, lumbar region: Secondary | ICD-10-CM | POA: Diagnosis not present

## 2017-10-26 DIAGNOSIS — Z955 Presence of coronary angioplasty implant and graft: Secondary | ICD-10-CM | POA: Diagnosis not present

## 2017-10-26 DIAGNOSIS — I10 Essential (primary) hypertension: Secondary | ICD-10-CM | POA: Diagnosis present

## 2017-10-26 DIAGNOSIS — K219 Gastro-esophageal reflux disease without esophagitis: Secondary | ICD-10-CM | POA: Diagnosis present

## 2017-10-26 DIAGNOSIS — Z961 Presence of intraocular lens: Secondary | ICD-10-CM | POA: Diagnosis present

## 2017-10-26 DIAGNOSIS — R0602 Shortness of breath: Secondary | ICD-10-CM | POA: Diagnosis not present

## 2017-10-26 DIAGNOSIS — K449 Diaphragmatic hernia without obstruction or gangrene: Secondary | ICD-10-CM | POA: Diagnosis not present

## 2017-10-26 DIAGNOSIS — M4807 Spinal stenosis, lumbosacral region: Secondary | ICD-10-CM | POA: Diagnosis not present

## 2017-10-26 DIAGNOSIS — Z9049 Acquired absence of other specified parts of digestive tract: Secondary | ICD-10-CM | POA: Diagnosis not present

## 2017-10-26 DIAGNOSIS — M5136 Other intervertebral disc degeneration, lumbar region: Secondary | ICD-10-CM | POA: Diagnosis not present

## 2017-10-26 DIAGNOSIS — Z419 Encounter for procedure for purposes other than remedying health state, unspecified: Secondary | ICD-10-CM

## 2017-10-26 SURGERY — POSTERIOR LUMBAR FUSION 2 LEVEL
Anesthesia: General | Site: Back

## 2017-10-26 MED ORDER — HYDROCODONE-ACETAMINOPHEN 10-325 MG PO TABS: 1.0000 | ORAL_TABLET | ORAL | Status: DC | PRN

## 2017-10-26 MED ORDER — ACETAMINOPHEN 650 MG RE SUPP
650.0000 mg | RECTAL | Status: DC | PRN
Start: 2017-10-26 — End: 2017-10-27

## 2017-10-26 MED ORDER — THROMBIN (RECOMBINANT) 20000 UNITS EX SOLR
CUTANEOUS | Status: AC
  Filled 2017-10-26: qty 20000

## 2017-10-26 MED ORDER — FAMOTIDINE 20 MG PO TABS
20.0000 mg | ORAL_TABLET | Freq: Two times a day (BID) | ORAL | Status: DC
  Administered 2017-10-26 – 2017-10-27 (×3): 20 mg via ORAL
  Filled 2017-10-26 (×3): qty 1

## 2017-10-26 MED ORDER — ATENOLOL 25 MG PO TABS
25.0000 mg | ORAL_TABLET | Freq: Every day | ORAL | Status: DC
  Administered 2017-10-26: 25 mg via ORAL
  Filled 2017-10-26: qty 1

## 2017-10-26 MED ORDER — MEPERIDINE HCL 25 MG/ML IJ SOLN: 6.2500 mg | INTRAMUSCULAR | Status: DC | PRN

## 2017-10-26 MED ORDER — MIDAZOLAM HCL 2 MG/2ML IJ SOLN
0.5000 mg | Freq: Once | INTRAMUSCULAR | Status: AC | PRN
  Administered 2017-10-26: 2 mg via INTRAVENOUS

## 2017-10-26 MED ORDER — PROPOFOL 10 MG/ML IV BOLUS
INTRAVENOUS | Status: DC | PRN
Start: 2017-10-26 — End: 2017-10-26
  Administered 2017-10-26: 160 mg via INTRAVENOUS

## 2017-10-26 MED ORDER — MIDAZOLAM HCL 2 MG/2ML IJ SOLN
INTRAMUSCULAR | Status: AC
  Filled 2017-10-26: qty 2

## 2017-10-26 MED ORDER — BUPIVACAINE HCL (PF) 0.25 % IJ SOLN
INTRAMUSCULAR | Status: AC
  Filled 2017-10-26: qty 30

## 2017-10-26 MED ORDER — BUPIVACAINE HCL (PF) 0.25 % IJ SOLN
INTRAMUSCULAR | Status: DC | PRN
  Administered 2017-10-26: 20 mL

## 2017-10-26 MED ORDER — ACETAMINOPHEN 325 MG PO TABS: 650.0000 mg | ORAL_TABLET | ORAL | Status: DC | PRN

## 2017-10-26 MED ORDER — PHENYLEPHRINE HCL 10 MG/ML IJ SOLN
INTRAMUSCULAR | Status: DC | PRN
  Administered 2017-10-26: 25 ug/min via INTRAVENOUS

## 2017-10-26 MED ORDER — HYDROMORPHONE HCL 1 MG/ML IJ SOLN
0.2500 mg | INTRAMUSCULAR | Status: DC | PRN
  Administered 2017-10-26: 0.5 mg via INTRAVENOUS
  Administered 2017-10-26: 1 mg via INTRAVENOUS
  Administered 2017-10-26: 0.5 mg via INTRAVENOUS

## 2017-10-26 MED ORDER — DICLOFENAC SODIUM 75 MG PO TBEC
75.0000 mg | DELAYED_RELEASE_TABLET | Freq: Two times a day (BID) | ORAL | Status: DC
  Administered 2017-10-27: 75 mg via ORAL
  Filled 2017-10-26 (×3): qty 1

## 2017-10-26 MED ORDER — GLYCOPYRROLATE 0.2 MG/ML IJ SOLN
INTRAMUSCULAR | Status: DC | PRN
  Administered 2017-10-26: 0.2 mg via INTRAVENOUS

## 2017-10-26 MED ORDER — MIDAZOLAM HCL 2 MG/2ML IJ SOLN
INTRAMUSCULAR | Status: AC
  Administered 2017-10-26: 2 mg via INTRAVENOUS
  Filled 2017-10-26: qty 2

## 2017-10-26 MED ORDER — PHENOL 1.4 % MT LIQD: 1.0000 | OROMUCOSAL | Status: DC | PRN

## 2017-10-26 MED ORDER — SUGAMMADEX SODIUM 200 MG/2ML IV SOLN
INTRAVENOUS | Status: DC | PRN
  Administered 2017-10-26: 200 mg via INTRAVENOUS

## 2017-10-26 MED ORDER — LIDOCAINE HCL (CARDIAC) 20 MG/ML IV SOLN
INTRAVENOUS | Status: DC | PRN
  Administered 2017-10-26: 80 mg via INTRAVENOUS

## 2017-10-26 MED ORDER — SODIUM CHLORIDE 0.9 % IV SOLN: 250.0000 mL | INTRAVENOUS | Status: DC

## 2017-10-26 MED ORDER — 0.9 % SODIUM CHLORIDE (POUR BTL) OPTIME
TOPICAL | Status: DC | PRN
  Administered 2017-10-26: 1000 mL

## 2017-10-26 MED ORDER — LIDOCAINE 2% (20 MG/ML) 5 ML SYRINGE
INTRAMUSCULAR | Status: AC
  Filled 2017-10-26: qty 5

## 2017-10-26 MED ORDER — THROMBIN (RECOMBINANT) 20000 UNITS EX SOLR
CUTANEOUS | Status: DC | PRN
  Administered 2017-10-26: 20 mL via TOPICAL

## 2017-10-26 MED ORDER — ONDANSETRON HCL 4 MG/2ML IJ SOLN
INTRAMUSCULAR | Status: DC | PRN
  Administered 2017-10-26: 4 mg via INTRAVENOUS

## 2017-10-26 MED ORDER — FLEET ENEMA 7-19 GM/118ML RE ENEM: 1.0000 | ENEMA | Freq: Once | RECTAL | Status: DC | PRN

## 2017-10-26 MED ORDER — CEFAZOLIN SODIUM-DEXTROSE 1-4 GM/50ML-% IV SOLN
1.0000 g | Freq: Three times a day (TID) | INTRAVENOUS | Status: AC
  Administered 2017-10-26 (×2): 1 g via INTRAVENOUS
  Filled 2017-10-26 (×2): qty 50

## 2017-10-26 MED ORDER — LACTATED RINGERS IV SOLN
INTRAVENOUS | Status: DC
  Administered 2017-10-26 (×2): via INTRAVENOUS

## 2017-10-26 MED ORDER — ARTIFICIAL TEARS OPHTHALMIC OINT
TOPICAL_OINTMENT | OPHTHALMIC | Status: DC | PRN
  Administered 2017-10-26: 1 via OPHTHALMIC

## 2017-10-26 MED ORDER — HYDROMORPHONE HCL 1 MG/ML IJ SOLN
INTRAMUSCULAR | Status: AC
  Administered 2017-10-26: 0.5 mg via INTRAVENOUS
  Filled 2017-10-26: qty 1

## 2017-10-26 MED ORDER — THROMBIN (RECOMBINANT) 5000 UNITS EX SOLR
OROMUCOSAL | Status: DC | PRN
  Administered 2017-10-26: 5 mL via TOPICAL

## 2017-10-26 MED ORDER — ARTIFICIAL TEARS OPHTHALMIC OINT
TOPICAL_OINTMENT | OPHTHALMIC | Status: AC
  Filled 2017-10-26: qty 3.5

## 2017-10-26 MED ORDER — ONDANSETRON HCL 4 MG PO TABS: 4.0000 mg | ORAL_TABLET | Freq: Four times a day (QID) | ORAL | Status: DC | PRN

## 2017-10-26 MED ORDER — THROMBIN (RECOMBINANT) 5000 UNITS EX SOLR
CUTANEOUS | Status: AC
  Filled 2017-10-26: qty 5000

## 2017-10-26 MED ORDER — FENTANYL CITRATE (PF) 250 MCG/5ML IJ SOLN
INTRAMUSCULAR | Status: AC
  Filled 2017-10-26: qty 5

## 2017-10-26 MED ORDER — PROPOFOL 10 MG/ML IV BOLUS
INTRAVENOUS | Status: AC
  Filled 2017-10-26: qty 20

## 2017-10-26 MED ORDER — ONDANSETRON HCL 4 MG/2ML IJ SOLN
INTRAMUSCULAR | Status: AC
  Filled 2017-10-26: qty 2

## 2017-10-26 MED ORDER — DIAZEPAM 5 MG PO TABS
5.0000 mg | ORAL_TABLET | Freq: Four times a day (QID) | ORAL | Status: DC | PRN
  Administered 2017-10-26 – 2017-10-27 (×2): 5 mg via ORAL
  Filled 2017-10-26 (×2): qty 1

## 2017-10-26 MED ORDER — OXYCODONE HCL 5 MG PO TABS
ORAL_TABLET | ORAL | Status: AC
  Filled 2017-10-26: qty 2

## 2017-10-26 MED ORDER — MENTHOL 3 MG MT LOZG: 1.0000 | LOZENGE | OROMUCOSAL | Status: DC | PRN

## 2017-10-26 MED ORDER — CEFAZOLIN SODIUM-DEXTROSE 2-4 GM/100ML-% IV SOLN
2.0000 g | INTRAVENOUS | Status: AC
  Administered 2017-10-26: 2 g via INTRAVENOUS
  Filled 2017-10-26: qty 100

## 2017-10-26 MED ORDER — POLYETHYLENE GLYCOL 3350 17 G PO PACK: 17.0000 g | PACK | Freq: Every day | ORAL | Status: DC | PRN

## 2017-10-26 MED ORDER — OXYCODONE HCL 5 MG PO TABS
10.0000 mg | ORAL_TABLET | ORAL | Status: DC | PRN
  Administered 2017-10-26 – 2017-10-27 (×5): 10 mg via ORAL
  Filled 2017-10-26 (×4): qty 2

## 2017-10-26 MED ORDER — VANCOMYCIN HCL 1000 MG IV SOLR
INTRAVENOUS | Status: AC
  Filled 2017-10-26: qty 1000

## 2017-10-26 MED ORDER — ONDANSETRON HCL 4 MG/2ML IJ SOLN
4.0000 mg | Freq: Four times a day (QID) | INTRAMUSCULAR | Status: DC | PRN
  Administered 2017-10-26: 4 mg via INTRAVENOUS
  Filled 2017-10-26: qty 2

## 2017-10-26 MED ORDER — MUPIROCIN 2 % EX OINT
1.0000 "application " | TOPICAL_OINTMENT | Freq: Two times a day (BID) | CUTANEOUS | Status: DC
  Administered 2017-10-26 – 2017-10-27 (×2): 1 via NASAL
  Filled 2017-10-26: qty 22

## 2017-10-26 MED ORDER — PROMETHAZINE HCL 25 MG/ML IJ SOLN: 6.2500 mg | INTRAMUSCULAR | Status: DC | PRN

## 2017-10-26 MED ORDER — SODIUM CHLORIDE 0.9 % IV SOLN
INTRAVENOUS | Status: DC | PRN
  Administered 2017-10-26: 13:00:00 via INTRAVENOUS

## 2017-10-26 MED ORDER — CHLORHEXIDINE GLUCONATE CLOTH 2 % EX PADS: 6.0000 | MEDICATED_PAD | Freq: Once | CUTANEOUS | Status: DC

## 2017-10-26 MED ORDER — FENTANYL CITRATE (PF) 100 MCG/2ML IJ SOLN
INTRAMUSCULAR | Status: DC | PRN
  Administered 2017-10-26 (×5): 50 ug via INTRAVENOUS

## 2017-10-26 MED ORDER — HYDROMORPHONE HCL 2 MG PO TABS: 2.0000 mg | ORAL_TABLET | ORAL | Status: DC | PRN

## 2017-10-26 MED ORDER — SODIUM CHLORIDE 0.9% FLUSH
3.0000 mL | Freq: Two times a day (BID) | INTRAVENOUS | Status: DC
  Administered 2017-10-26: 3 mL via INTRAVENOUS

## 2017-10-26 MED ORDER — EPHEDRINE SULFATE-NACL 50-0.9 MG/10ML-% IV SOSY
PREFILLED_SYRINGE | INTRAVENOUS | Status: DC | PRN
  Administered 2017-10-26: 5 mg via INTRAVENOUS

## 2017-10-26 MED ORDER — DEXAMETHASONE SODIUM PHOSPHATE 10 MG/ML IJ SOLN
10.0000 mg | INTRAMUSCULAR | Status: AC
  Administered 2017-10-26: 10 mg via INTRAVENOUS
  Filled 2017-10-26: qty 1

## 2017-10-26 MED ORDER — MIDAZOLAM HCL 5 MG/5ML IJ SOLN
INTRAMUSCULAR | Status: DC | PRN
  Administered 2017-10-26: 1 mg via INTRAVENOUS

## 2017-10-26 MED ORDER — ALPRAZOLAM 0.5 MG PO TABS
0.5000 mg | ORAL_TABLET | Freq: Every day | ORAL | Status: DC
  Filled 2017-10-26: qty 1

## 2017-10-26 MED ORDER — LORATADINE 10 MG PO TABS
10.0000 mg | ORAL_TABLET | Freq: Every day | ORAL | Status: DC
  Administered 2017-10-26 – 2017-10-27 (×2): 10 mg via ORAL
  Filled 2017-10-26 (×2): qty 1

## 2017-10-26 MED ORDER — ROCURONIUM BROMIDE 100 MG/10ML IV SOLN
INTRAVENOUS | Status: DC | PRN
  Administered 2017-10-26: 50 mg via INTRAVENOUS
  Administered 2017-10-26 (×2): 20 mg via INTRAVENOUS
  Administered 2017-10-26 (×2): 10 mg via INTRAVENOUS

## 2017-10-26 MED ORDER — HYDROMORPHONE HCL 1 MG/ML IJ SOLN
1.0000 mg | INTRAMUSCULAR | Status: DC | PRN
  Administered 2017-10-26: 1 mg via INTRAVENOUS
  Filled 2017-10-26: qty 1

## 2017-10-26 MED ORDER — BISACODYL 10 MG RE SUPP: 10.0000 mg | Freq: Every day | RECTAL | Status: DC | PRN

## 2017-10-26 MED ORDER — SODIUM CHLORIDE 0.9% FLUSH: 3.0000 mL | INTRAVENOUS | Status: DC | PRN

## 2017-10-26 MED ORDER — VANCOMYCIN HCL 1000 MG IV SOLR
INTRAVENOUS | Status: DC | PRN
  Administered 2017-10-26: 1000 mg

## 2017-10-26 MED ORDER — SUGAMMADEX SODIUM 200 MG/2ML IV SOLN
INTRAVENOUS | Status: AC
  Filled 2017-10-26: qty 2

## 2017-10-26 MED ORDER — SODIUM CHLORIDE 0.9 % IR SOLN
Status: DC | PRN
  Administered 2017-10-26: 500 mL

## 2017-10-26 SURGICAL SUPPLY — 69 items
BAG DECANTER FOR FLEXI CONT (MISCELLANEOUS) ×3 IMPLANT
BENZOIN TINCTURE PRP APPL 2/3 (GAUZE/BANDAGES/DRESSINGS) ×3 IMPLANT
BLADE CLIPPER SURG (BLADE) ×3 IMPLANT
BUR CUTTER 7.0 ROUND (BURR) ×3 IMPLANT
BUR MATCHSTICK NEURO 3.0 LAGG (BURR) ×3 IMPLANT
CANISTER SUCT 3000ML PPV (MISCELLANEOUS) ×3 IMPLANT
CAP LCK SPNE (Orthopedic Implant) ×6 IMPLANT
CAP LOCK SPINE RADIUS (Orthopedic Implant) ×6 IMPLANT
CAP LOCKING (Orthopedic Implant) ×12 IMPLANT
CARTRIDGE OIL MAESTRO DRILL (MISCELLANEOUS) ×1 IMPLANT
CATH FOLEY LATEX FREE 14FR (CATHETERS) ×2
CATH FOLEY LF 14FR (CATHETERS) ×1 IMPLANT
CLOSURE WOUND 1/2 X4 (GAUZE/BANDAGES/DRESSINGS) ×1
CONT SPEC 4OZ CLIKSEAL STRL BL (MISCELLANEOUS) ×3 IMPLANT
COVER BACK TABLE 60X90IN (DRAPES) ×3 IMPLANT
CROSSLINK MEDIUM (Orthopedic Implant) ×3 IMPLANT
DECANTER SPIKE VIAL GLASS SM (MISCELLANEOUS) ×3 IMPLANT
DERMABOND ADVANCED (GAUZE/BANDAGES/DRESSINGS) ×2
DERMABOND ADVANCED .7 DNX12 (GAUZE/BANDAGES/DRESSINGS) ×1 IMPLANT
DEVICE INTERBODY ELEVATE 10X23 (Cage) ×6 IMPLANT
DEVICE INTERBODY ELEVATE 23X10 (Cage) ×6 IMPLANT
DIFFUSER DRILL AIR PNEUMATIC (MISCELLANEOUS) ×3 IMPLANT
DRAPE C-ARM 42X72 X-RAY (DRAPES) ×6 IMPLANT
DRAPE HALF SHEET 40X57 (DRAPES) IMPLANT
DRAPE LAPAROTOMY 100X72X124 (DRAPES) ×3 IMPLANT
DRAPE POUCH INSTRU U-SHP 10X18 (DRAPES) ×3 IMPLANT
DRAPE SURG 17X23 STRL (DRAPES) ×12 IMPLANT
DRSG OPSITE POSTOP 4X8 (GAUZE/BANDAGES/DRESSINGS) ×3 IMPLANT
DURAPREP 26ML APPLICATOR (WOUND CARE) ×3 IMPLANT
ELECT REM PT RETURN 9FT ADLT (ELECTROSURGICAL) ×3
ELECTRODE REM PT RTRN 9FT ADLT (ELECTROSURGICAL) ×1 IMPLANT
EVACUATOR 1/8 PVC DRAIN (DRAIN) IMPLANT
GAUZE SPONGE 4X4 12PLY STRL (GAUZE/BANDAGES/DRESSINGS) IMPLANT
GAUZE SPONGE 4X4 16PLY XRAY LF (GAUZE/BANDAGES/DRESSINGS) ×3 IMPLANT
GLOVE BIO SURGEON STRL SZ8 (GLOVE) ×3 IMPLANT
GLOVE BIOGEL PI IND STRL 6.5 (GLOVE) ×4 IMPLANT
GLOVE BIOGEL PI INDICATOR 6.5 (GLOVE) ×8
GLOVE ECLIPSE 9.0 STRL (GLOVE) ×6 IMPLANT
GLOVE EXAM NITRILE LRG STRL (GLOVE) IMPLANT
GLOVE EXAM NITRILE XL STR (GLOVE) IMPLANT
GLOVE EXAM NITRILE XS STR PU (GLOVE) ×9 IMPLANT
GLOVE INDICATOR 8.5 STRL (GLOVE) ×3 IMPLANT
GLOVE SURG SS PI 6.5 STRL IVOR (GLOVE) ×18 IMPLANT
GOWN STRL REUS W/ TWL LRG LVL3 (GOWN DISPOSABLE) ×4 IMPLANT
GOWN STRL REUS W/ TWL XL LVL3 (GOWN DISPOSABLE) ×3 IMPLANT
GOWN STRL REUS W/TWL 2XL LVL3 (GOWN DISPOSABLE) IMPLANT
GOWN STRL REUS W/TWL LRG LVL3 (GOWN DISPOSABLE) ×8
GOWN STRL REUS W/TWL XL LVL3 (GOWN DISPOSABLE) ×6
KIT BASIN OR (CUSTOM PROCEDURE TRAY) ×3 IMPLANT
KIT ROOM TURNOVER OR (KITS) ×3 IMPLANT
MILL MEDIUM DISP (BLADE) ×3 IMPLANT
NEEDLE HYPO 22GX1.5 SAFETY (NEEDLE) ×3 IMPLANT
NS IRRIG 1000ML POUR BTL (IV SOLUTION) ×3 IMPLANT
OIL CARTRIDGE MAESTRO DRILL (MISCELLANEOUS) ×3
PACK LAMINECTOMY NEURO (CUSTOM PROCEDURE TRAY) ×3 IMPLANT
ROD 70MM (Rod) ×4 IMPLANT
ROD SPNL 70X5.5XNS TI RDS (Rod) ×2 IMPLANT
SCREW 6.75X40MM (Screw) ×3 IMPLANT
SCREW 6.75X45MM (Screw) ×15 IMPLANT
SPONGE SURGIFOAM ABS GEL 100 (HEMOSTASIS) ×3 IMPLANT
STRIP CLOSURE SKIN 1/2X4 (GAUZE/BANDAGES/DRESSINGS) ×2 IMPLANT
SUT VIC AB 0 CT1 18XCR BRD8 (SUTURE) ×2 IMPLANT
SUT VIC AB 0 CT1 8-18 (SUTURE) ×4
SUT VIC AB 2-0 CT1 18 (SUTURE) ×3 IMPLANT
SUT VIC AB 3-0 SH 8-18 (SUTURE) ×6 IMPLANT
TOWEL GREEN STERILE (TOWEL DISPOSABLE) ×3 IMPLANT
TOWEL GREEN STERILE FF (TOWEL DISPOSABLE) ×3 IMPLANT
TRAY FOLEY W/METER SILVER 16FR (SET/KITS/TRAYS/PACK) ×3 IMPLANT
WATER STERILE IRR 1000ML POUR (IV SOLUTION) ×3 IMPLANT

## 2017-10-26 NOTE — Op Note (Signed)
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  L4-5, L5-S1 posterior lumbar interbody fusion utilizing interbody cages and locally harvested autograft  L4-5 S1 posterior lateral arthrodesis utilizing segmental pedicle screw fixation and local autograft  Surgeon:Emileigh Kellett A.Harim Bi, M.D.  Asst. Surgeon: Saintclair Halsted  Anesthesia: General  Indication: 71 year old male status post previous right-sided L4-5 and L5S1 surgery presents with intractable back and right lower extremity radicular pain failing all conservative management.  Workup demonstrates evidence of marked disc space collapse with severe foraminal stenosis at L4-5 and L5-S1.  Patient has evidence of recurrent disc protrusion laterally at L5-S1 with compression of the exiting right L5 nerve root and right S1 nerve root.  Patient presents now for 2 level decompression and fusion in hopes of improving his symptoms.  Operative note: After induction of anesthesia, patient position prone on the Wilson frame and properly padded.  Lumbar region prepped and draped sterilely.  Incision made overlying her.  Laminotomies L4-S1.  Dissection performed bilaterally.  Retractor placed.  Fluoroscopy used.  Levels confirmed.  Previous laminotomy sites on the right at L4-5 and L5-S1 were dissected free.  Epidural scar was resected of L4-5 4 widened and complete inferior facetectomies of L4 were performed bilaterally and superior facetectomy of L5 were performed bilaterally.   Ligament flavum and epidural scar were elevated and resected.  Foraminotomies along the course exiting L4 and L5 nerve roots were performed bilaterally.  At L5 the spinous process was found to be fractured.  Complete laminectomy of L5 was performed.  Complete inferior facetectomies and superior facetectomies of L5 and S1 were performed.  Ligament flavum elevated and resected as was epidural scar.  Foraminotomies completed on the course exiting L5 and S1 nerve roots.  Bilateral discectomies and performed at L4-5 and L5-S1 including the recurrent disc herniation on the right side at L5-S1.  Disc spaces were then prepared for interbody fusion.  With the distractor was placed in the patient's right side disc space was then cleaned of all soft tissue.  A 10 mm regular Medtronic expandable cage packed with locally harvested autograft and impacted in place and expanded to its full extent at L4-5.  Distractor removed patient's right side.  The space once again prepared for interbody fusion.  Soft tissue removed from the interspace.  Morselized autograft packed into the interspace.  A second cage was then impacted into place and expanded to its full extent.  Procedure was then repeated at L5-S1 again without complication this time using a 10 mm extra lordotic implant on each side.  Pedicles of L4-L5 and S1 were then identified using surface landmarks and intraoperative fluoroscopy.  Superficial bone overlying the pedicle was then removed using high-speed drill.  Pedicle was then probed using pedicle all each pedicle tract was then probed and found to be solid within the bone.  Each pedicle tract was then tapped and once again probed.  6.75 mm radius bran screws from [employer] medical place bilaterally at L4-L5 and S1.  Final images reveal good position of the cages and screws at the proper operative level with normal alignment of  spine.  Wounds and irrigated one final time.  Transverse processes were decorticated.  Morcellized  autograft was packed posterior laterally.  Short segment titanium rod placed over the screw heads at L4-5 and S1.  Locking caps in place of the screws.  Locking caps and engaged with a construct and compression.  Transverse connector was placed.  Gelfoam was placed over the laminectomy defect.  Vancomycin powder was placed in deep wound space.  Wounds and closed in layers of Vicryl sutures.  Steri-Strips and sterile dressing were applied.  No apparent complications.  Patient tolerated the procedure well and she returns to the recovery room postop.
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
## 2017-10-26 NOTE — Anesthesia Preprocedure Evaluation (Addendum)
Anesthesia Evaluation  Patient identified by MRN, date of birth, ID band Patient awake    Reviewed: Allergy & Precautions, NPO status , Patient's Chart, lab work & pertinent test results  History of Anesthesia Complications Negative for: history of anesthetic complications  Airway Mallampati: II  TM Distance: >3 FB Neck ROM: Full    Dental  (+) Edentulous Upper, Dental Advisory Given   Pulmonary shortness of breath,    breath sounds clear to auscultation       Cardiovascular hypertension, Pt. on medications and Pt. on home beta blockers + Cardiac Stents (DES 2014)   Rhythm:Regular Rate:Normal     Neuro/Psych    GI/Hepatic hiatal hernia, GERD  Medicated,  Endo/Other  Morbid obesity  Renal/GU Renal InsufficiencyRenal disease (creat 1.88)     Musculoskeletal  (+) Arthritis ,   Abdominal   Peds  Hematology   Anesthesia Other Findings   Reproductive/Obstetrics                            Anesthesia Physical Anesthesia Plan  ASA: III  Anesthesia Plan: General   Post-op Pain Management:    Induction: Intravenous  PONV Risk Score and Plan: 3 and Treatment may vary due to age or medical condition, Dexamethasone and Ondansetron  Airway Management Planned: Oral ETT  Additional Equipment:   Intra-op Plan:   Post-operative Plan: Extubation in OR  Informed Consent: I have reviewed the patients History and Physical, chart, labs and discussed the procedure including the risks, benefits and alternatives for the proposed anesthesia with the patient or authorized representative who has indicated his/her understanding and acceptance.   Dental advisory given  Plan Discussed with: CRNA  Anesthesia Plan Comments:         Anesthesia Quick Evaluation

## 2017-10-26 NOTE — Evaluation (Signed)
Physical Therapy Evaluation Patient Details Name: Aaron Houston MRN: [id number] DOB: 02/22/1946 Today's Date: 10/26/2017   History of Present Illness  Pt is a 71 y/o male s/p L4-S1 PLIF. PMH includes skin cancer, CAD s/p cardiac cath, HTN, and Lumbar surgery.   Clinical Impression  Pt is s/p surgery above with deficits below. PTA, pt was independent with functional mobility. Upon eval, pt presenting with post op pain and weakness, and was also very shaky and unsteady with ambulation with RW. Pt very sleepy throughout and had episode of nausea as well. Required min to min guard assist for mobility with RW. Anticipate pt will progress well once more alert and with decreased pain. Reports wife will be able to assist at d/c and will need DME below. Will continue to follow acutely to maximize functional mobility independence and safety.     Follow Up Recommendations No PT follow up;Supervision for mobility/OOB    Equipment Recommendations  Rolling walker with 5" wheels;3in1 (PT)    Recommendations for Other Services       Precautions / Restrictions Precautions Precautions: Back Precaution Booklet Issued: Yes (comment) Precaution Comments: Reviewed back precautions handout with pt.  Required Braces or Orthoses: Spinal Brace Spinal Brace: Lumbar corset;Applied in sitting position Restrictions Weight Bearing Restrictions: No      Mobility  Bed Mobility Overal bed mobility: Needs Assistance Bed Mobility: Rolling;Sidelying to Sit;Sit to Sidelying Rolling: Min guard Sidelying to sit: Min guard     Sit to sidelying: Min guard General bed mobility comments: Min guard to ensure log roll technique. Verbal cues for sequencing.   Transfers Overall transfer level: Needs assistance Equipment used: Rolling walker (2 wheeled) Transfers: Sit to/from Stand Sit to Stand: Min assist         General transfer comment: Min A for lift assist and steadying. Verbal cues for safe hand placement.  Pt with episode of nausea upon standing, and required seated rest to allow symptoms to subside.   Ambulation/Gait Ambulation/Gait assistance: Min assist;Min guard Ambulation Distance (Feet): 50 Feet Assistive device: Rolling walker (2 wheeled) Gait Pattern/deviations: Step-through pattern;Decreased stride length Gait velocity: Decreased Gait velocity interpretation: Below normal speed for age/gender General Gait Details: Pt very shaky and very guarded during gait with RW. Educated about use of RW at home. Min to min guard assist for steadying throughout. Verbal cues for pt to keep eyes open as well.   Stairs            Wheelchair Mobility    Modified Rankin (Stroke Patients Only)       Balance Overall balance assessment: Needs assistance Sitting-balance support: No upper extremity supported;Feet supported Sitting balance-Leahy Scale: Fair     Standing balance support: Bilateral upper extremity supported;During functional activity Standing balance-Leahy Scale: Poor Standing balance comment: Reliant on BUE support for balance.                              Pertinent Vitals/Pain Pain Assessment: 0-10 Pain Score: 8  Pain Location: back  Pain Descriptors / Indicators: Aching;Operative site guarding Pain Intervention(s): Limited activity within patient's tolerance;Monitored during session;Repositioned    Home Living Family/patient expects to be discharged to:: Private residence Living Arrangements: Spouse/significant other Available Help at Discharge: Family;Available 24 hours/day Type of Home: House Home Access: Stairs to enter Entrance Stairs-Rails: Psychiatric nurse of Steps: 5 Home Layout: One level Home Equipment: None      Prior Function Level of Independence: Independent  Hand Dominance   Dominant Hand: Right    Extremity/Trunk Assessment   Upper Extremity Assessment Upper Extremity Assessment: Defer to OT  evaluation    Lower Extremity Assessment Lower Extremity Assessment: Generalized weakness    Cervical / Trunk Assessment Cervical / Trunk Assessment: Other exceptions Cervical / Trunk Exceptions: s/p PLIF   Communication   Communication: No difficulties  Cognition Arousal/Alertness: Suspect due to medications Behavior During Therapy: WFL for tasks assessed/performed Overall Cognitive Status: Within Functional Limits for tasks assessed                                 General Comments: Very sleepy throughout session      General Comments General comments (skin integrity, edema, etc.): Educated about generalized walking program to perform at home     Exercises     Assessment/Plan    PT Assessment Patient needs continued PT services  PT Problem List Decreased strength;Decreased activity tolerance;Decreased balance;Decreased mobility;Decreased knowledge of use of DME;Decreased knowledge of precautions;Pain       PT Treatment Interventions DME instruction;Gait training;Stair training;Functional mobility training;Therapeutic activities;Therapeutic exercise;Balance training;Neuromuscular re-education;Patient/family education    PT Goals (Current goals can be found in the Care Plan section)  Acute Rehab PT Goals Patient Stated Goal: to feel better  PT Goal Formulation: With patient Time For Goal Achievement: 11/02/17 Potential to Achieve Goals: Good    Frequency Min 5X/week   Barriers to discharge        Co-evaluation               AM-PAC PT "6 Clicks" Daily Activity  Outcome Measure Difficulty turning over in bed (including adjusting bedclothes, sheets and blankets)?: Unable Difficulty moving from lying on back to sitting on the side of the bed? : Unable Difficulty sitting down on and standing up from a chair with arms (e.g., wheelchair, bedside commode, etc,.)?: Unable Help needed moving to and from a bed to chair (including a wheelchair)?: A  Little Help needed walking in hospital room?: A Little Help needed climbing 3-5 steps with a railing? : A Lot 6 Click Score: 11    End of Session Equipment Utilized During Treatment: Gait belt;Back brace Activity Tolerance: Treatment limited secondary to medical complications (Comment)(nausea ) Patient left: in bed;with call bell/phone within reach Nurse Communication: Mobility status PT Visit Diagnosis: Unsteadiness on feet (R26.81);Other abnormalities of gait and mobility (R26.89);Pain Pain - part of body: (back )    Time: 8756-4332 PT Time Calculation (min) (ACUTE ONLY): 25 min   Charges:   PT Evaluation $PT Eval Moderate Complexity: 1 Mod PT Treatments $Gait Training: 8-22 mins   PT G Codes:        Leighton Ruff, PT, DPT  Acute Rehabilitation Services  Pager: [phone]   Rudean Hitt 10/26/2017, 4:59 PM

## 2017-10-26 NOTE — Addendum Note (Signed)
Addendum  created 10/26/17 1704 by Candis Shine, CRNA   Charge Capture section accepted

## 2017-10-26 NOTE — Brief Op Note (Signed)
10/26/2017  1:24 PM  PATIENT:  Duard Brady  71 y.o. male  PRE-OPERATIVE DIAGNOSIS:  Stenosis  POST-OPERATIVE DIAGNOSIS:  Stenosis  PROCEDURE:  Procedure(s): Posterior Lumbar Four-Five/Five-Sacral One Interbody and Fusion (N/A)  SURGEON:  Surgeon(s) and Role:    * Earnie Larsson, MD - Primary    * Kary Kos, MD - Assisting  PHYSICIAN ASSISTANT:   ASSISTANTS:    ANESTHESIA:   general  EBL:  350 mL   BLOOD ADMINISTERED:none  DRAINS: none   LOCAL MEDICATIONS USED:  MARCAINE     SPECIMEN:  No Specimen  DISPOSITION OF SPECIMEN:  N/A  COUNTS:  YES  TOURNIQUET:  * No tourniquets in log *  DICTATION: .Dragon Dictation  PLAN OF CARE: Admit to inpatient   PATIENT DISPOSITION:  PACU - hemodynamically stable.   Delay start of Pharmacological VTE agent (>24hrs) due to surgical blood loss or risk of bleeding: yes

## 2017-10-26 NOTE — Anesthesia Procedure Notes (Signed)
Procedure Name: Intubation Date/Time: 10/26/2017 9:46 AM Performed by: Candis Shine, CRNA Pre-anesthesia Checklist: Patient identified, Emergency Drugs available, Suction available and Patient being monitored Patient Re-evaluated:Patient Re-evaluated prior to induction Oxygen Delivery Method: Circle System Utilized Preoxygenation: Pre-oxygenation with 100% oxygen Induction Type: IV induction Ventilation: Mask ventilation without difficulty and Oral airway inserted - appropriate to patient size Laryngoscope Size: Mac and 3 Grade View: Grade I Tube type: Oral Tube size: 7.5 mm Number of attempts: 1 Airway Equipment and Method: Stylet and Oral airway Placement Confirmation: ETT inserted through vocal cords under direct vision,  positive ETCO2 and breath sounds checked- equal and bilateral Secured at: 22 cm Tube secured with: Tape Dental Injury: Teeth and Oropharynx as per pre-operative assessment

## 2017-10-26 NOTE — Anesthesia Postprocedure Evaluation (Signed)
Anesthesia Post Note  Patient: Aaron Houston  Procedure(s) Performed: Posterior Lumbar Four-Five/Five-Sacral One Interbody and Fusion (N/A Back)     Patient location during evaluation: PACU Anesthesia Type: General Level of consciousness: awake and sedated Pain management: pain level controlled Vital Signs Assessment: post-procedure vital signs reviewed and stable Respiratory status: spontaneous breathing, nonlabored ventilation, respiratory function stable and patient connected to nasal cannula oxygen Cardiovascular status: blood pressure returned to baseline and stable Postop Assessment: no apparent nausea or vomiting Anesthetic complications: no    Last Vitals:  Vitals:   10/26/17 1421 10/26/17 1435  BP: 112/69 121/71  Pulse: 71 73  Resp: (!) 23 10  Temp:    SpO2: 98% 100%    Last Pain:  Vitals:   10/26/17 1402  TempSrc:   PainSc: 10-Worst pain ever                 Tonantzin Mimnaugh,JAMES TERRILL

## 2017-10-26 NOTE — Transfer of Care (Signed)
Immediate Anesthesia Transfer of Care Note  Patient: Aaron Houston  Procedure(s) Performed: Posterior Lumbar Four-Five/Five-Sacral One Interbody and Fusion (N/A Back)  Patient Location: PACU  Anesthesia Type:General  Level of Consciousness: awake, alert  and oriented  Airway & Oxygen Therapy: Patient Spontanous Breathing and Patient connected to nasal cannula oxygen  Post-op Assessment: Report given to RN and Post -op Vital signs reviewed and stable  Post vital signs: Reviewed and stable  Last Vitals:  Vitals:   10/26/17 0744  BP: (!) 164/89  Pulse: (!) 50  Resp: 18  Temp: 36.7 C  SpO2: 100%    Last Pain:  Vitals:   10/26/17 0808  TempSrc:   PainSc: 5       Patients Stated Pain Goal: 2 (37/54/36 0677)  Complications: No apparent anesthesia complications

## 2017-10-26 NOTE — Progress Notes (Signed)
Orthopedic Tech Progress Note Patient Details:  Aaron Houston 10/11/1946 [id number] Patient already has brace. Patient ID: [name], male   DOB: September 02, 1946, 71 y.o.   MRN: [id number]   Braulio Bosch 10/26/2017, 3:02 PM

## 2017-10-27 MED ORDER — HYDROCODONE-ACETAMINOPHEN 10-325 MG PO TABS: 1.0000 | ORAL_TABLET | ORAL | 0 refills | Status: DC | PRN

## 2017-10-27 MED ORDER — DIAZEPAM 5 MG PO TABS: 5.0000 mg | ORAL_TABLET | Freq: Four times a day (QID) | ORAL | 0 refills | Status: DC | PRN

## 2017-10-27 MED FILL — Sodium Chloride IV Soln 0.9%: INTRAVENOUS | Qty: 1000 | Status: AC

## 2017-10-27 MED FILL — Heparin Sodium (Porcine) Inj 1000 Unit/ML: INTRAMUSCULAR | Qty: 30 | Status: AC

## 2017-10-27 NOTE — Discharge Summary (Signed)
Physician Discharge Summary  Patient ID: Aaron Houston MRN: [id number] DOB/AGE: 1946-06-30 71 y.o.  Admit date: 10/26/2017 Discharge date: 10/27/2017  Admission Diagnoses:  Discharge Diagnoses:  Active Problems:   Lumbar foraminal stenosis   Discharged Condition: good  Hospital Course: patient admitted to the hospital where he underwent an uncomplicated two-level lumbar decompression.  Postoperatively he is doing well.  Ambulating without .  Ready for discharge home  Consults:   Significant Diagnostic Studies:   Treatments:   Discharge Exam: Blood pressure 126/75, pulse (!) 59, temperature 98.2 F (36.8 C), temperature source Oral, resp. rate 18, height 5\' 4"  (1.626 m), weight 85.3 kg (188 lb), SpO2 100 %. Awake and alert.  Oriented and appropriate.  Motor and sensory  Intact.wound clean and dry.  Chest and abdomen benign Disposition: 01-Home or Self Care   Allergies as of 10/27/2017      Reactions   Codeine Nausea Only, Rash   Hydrocodone Nausea And Vomiting   Can tolerate if given with anti-nausea medicine    Oxycodone Nausea And Vomiting   Can tolerate better if taking anti nausea       Medication List    TAKE these medications   ALPRAZolam 1 MG tablet Commonly known as:  XANAX Take 0.5 mg by mouth at bedtime.   atenolol 25 MG tablet Commonly known as:  TENORMIN Take 25 mg by mouth at bedtime.   cetirizine 10 MG tablet Commonly known as:  ZYRTEC Take 10 mg by mouth daily as needed for allergies.   cyclobenzaprine 10 MG tablet Commonly known as:  FLEXERIL Take 1 tablet (10 mg total) by mouth 3 (three) times daily as needed for muscle spasms.   diazepam 5 MG tablet Commonly known as:  VALIUM Take 1-2 tablets (5-10 mg total) by mouth every 6 (six) hours as needed for muscle spasms.   diclofenac 75 MG EC tablet Commonly known as:  VOLTAREN Take 75 mg by mouth 2 (two) times daily.   HYDROcodone-acetaminophen 10-325 MG tablet Commonly known as:   NORCO Take 1-2 tablets by mouth every 4 (four) hours as needed for moderate pain ((score 4 to 6)).   meloxicam 15 MG tablet Commonly known as:  MOBIC Take 1 tablet (15 mg total) by mouth daily.   oxyCODONE-acetaminophen 5-325 MG tablet Commonly known as:  PERCOCET/ROXICET Take 1-2 tablets by mouth every 4 (four) hours as needed for pain.   ranitidine 150 MG tablet Commonly known as:  ZANTAC Take 150 mg by mouth at bedtime.            Durable Medical Equipment  (From admission, onward)        Start     Ordered   10/26/17 1507  DME Walker rolling  Once    Question:  Patient needs a walker to treat with the following condition  Answer:  Lumbar foraminal stenosis   10/26/17 1506   10/26/17 1507  DME 3 n 1  Once     10/26/17 1506       Signed: [name] A Celisse Ciulla 10/27/2017, 10:13 AM

## 2017-10-27 NOTE — Progress Notes (Signed)
Physical Therapy Treatment Patient Details Name: Aaron Houston MRN: [id number] DOB: 02/02/1946 Today's Date: 10/27/2017    History of Present Illness Pt is a 71 y/o male s/p L4-S1 PLIF. PMH includes skin cancer, CAD s/p cardiac cath, HTN, and Lumbar surgery.     PT Comments    Pt progressing well towards physical therapy goals. Was able to perform transfers and ambulation with gross min guard assist to supervision for safety with RW for support. Pt completed stair training this session and was educated on walking program, car transfer, precautions, and safe activity progression. Will continue to follow.    Follow Up Recommendations  No PT follow up;Supervision for mobility/OOB     Equipment Recommendations  Rolling walker with 5" wheels;3in1 (PT)    Recommendations for Other Services       Precautions / Restrictions Precautions Precautions: Back Precaution Booklet Issued: Yes (comment) Precaution Comments: Reviewed back precautions handout with pt.  Required Braces or Orthoses: Spinal Brace Spinal Brace: Lumbar corset;Applied in sitting position Restrictions Weight Bearing Restrictions: No    Mobility  Bed Mobility Overal bed mobility: Needs Assistance Bed Mobility: Rolling;Sidelying to Sit;Sit to Sidelying Rolling: Modified independent (Device/Increase time) Sidelying to sit: Supervision       General bed mobility comments: VC's for proper log roll technique. Was able to transition to EOB without difficulty. Min use of rails  Transfers Overall transfer level: Needs assistance Equipment used: Rolling walker (2 wheeled) Transfers: Sit to/from Stand Sit to Stand: Supervision         General transfer comment: Close supervision for safety as pt powered up to full standing position. No overt LOB noted however pt was quick to lean on RW for support.   Ambulation/Gait Ambulation/Gait assistance: Min guard;Supervision Ambulation Distance (Feet): 200 Feet Assistive  device: Rolling walker (2 wheeled) Gait Pattern/deviations: Step-through pattern;Decreased stride length Gait velocity: Decreased Gait velocity interpretation: Below normal speed for age/gender General Gait Details: Min guard initially progressing to supervision for safety. No assist required.    Stairs Stairs: Yes   Stair Management: One rail Left;Step to pattern;Forwards Number of Stairs: 6 General stair comments: VC's for sequencing and general safety.   Wheelchair Mobility    Modified Rankin (Stroke Patients Only)       Balance Overall balance assessment: Needs assistance Sitting-balance support: No upper extremity supported;Feet supported Sitting balance-Leahy Scale: Fair     Standing balance support: Bilateral upper extremity supported;During functional activity Standing balance-Leahy Scale: Fair Standing balance comment: Stood to void without UE support on walker                            Cognition Arousal/Alertness: Suspect due to medications Behavior During Therapy: WFL for tasks assessed/performed Overall Cognitive Status: Within Functional Limits for tasks assessed                                        Exercises      General Comments        Pertinent Vitals/Pain Pain Assessment: Faces Faces Pain Scale: Hurts little more Pain Location: back  Pain Descriptors / Indicators: Aching;Operative site guarding Pain Intervention(s): Limited activity within patient's tolerance;Monitored during session;Repositioned    Home Living                      Prior Function  PT Goals (current goals can now be found in the care plan section) Acute Rehab PT Goals Patient Stated Goal: to feel better  PT Goal Formulation: With patient Time For Goal Achievement: 11/02/17 Potential to Achieve Goals: Good Progress towards PT goals: Progressing toward goals    Frequency    Min 5X/week      PT Plan Current plan  remains appropriate    Co-evaluation              AM-PAC PT "6 Clicks" Daily Activity  Outcome Measure  Difficulty turning over in bed (including adjusting bedclothes, sheets and blankets)?: None Difficulty moving from lying on back to sitting on the side of the bed? : A Little Difficulty sitting down on and standing up from a chair with arms (e.g., wheelchair, bedside commode, etc,.)?: A Little Help needed moving to and from a bed to chair (including a wheelchair)?: A Little Help needed walking in hospital room?: A Little Help needed climbing 3-5 steps with a railing? : A Little 6 Click Score: 19    End of Session Equipment Utilized During Treatment: Gait belt;Back brace Activity Tolerance: Patient tolerated treatment well Patient left: in chair;with call bell/phone within reach Nurse Communication: Mobility status PT Visit Diagnosis: Unsteadiness on feet (R26.81);Other abnormalities of gait and mobility (R26.89);Pain Pain - part of body: (back )     Time: 9622-2979 PT Time Calculation (min) (ACUTE ONLY): 22 min  Charges:  $Gait Training: 8-22 mins                    G Codes:       Rolinda Roan, PT, DPT Acute Rehabilitation Services Pager: [phone]    Thelma Comp 10/27/2017, 8:57 AM

## 2017-10-27 NOTE — Evaluation (Signed)
Occupational Therapy Evaluation and Discharge Patient Details Name: Aaron Houston MRN: [id number] DOB: [birth year] Today's Date: 10/27/2017    History of Present Illness Pt is a 71 y/o male s/p L4-S1 PLIF. PMH includes skin cancer, CAD s/p cardiac cath, HTN, and Lumbar surgery.    Clinical Impression   Pt reports he was independent with ADL PTA. Currently pt supervision with ADL and functional mobility. All back, safety, and ADL education completed with pt. Pt planning to d/c home with 24/7 supervision from family. No further acute OT needs identified; signing off at this time. Please re-consult if needs change. Thank you for this referral.    Follow Up Recommendations  No OT follow up;Supervision/Assistance - 24 hour(initially)    Equipment Recommendations  3 in 1 bedside commode    Recommendations for Other Services       Precautions / Restrictions Precautions Precautions: Back Precaution Booklet Issued: No Precaution Comments: Pt able to recall 2/3 back precautions, reviewed all with pt. Required Braces or Orthoses: Spinal Brace Spinal Brace: Lumbar corset;Applied in sitting position Restrictions Weight Bearing Restrictions: No      Mobility Bed Mobility        General bed mobility comments: Pt OOB in chair upon arrival.  Transfers Overall transfer level: Needs assistance Equipment used: Rolling walker (2 wheeled) Transfers: Sit to/from Stand Sit to Stand: Supervision         General transfer comment: good hand placement and technique. supervision for safety    Balance Overall balance assessment: Needs assistance Sitting-balance support: Feet supported;No upper extremity supported Sitting balance-Leahy Scale: Good     Standing balance support: Single extremity supported;During functional activity Standing balance-Leahy Scale: Fair Standing balance comment: Stood to void without UE support on walker                           ADL either  performed or assessed with clinical judgement   ADL Overall ADL's : Needs assistance/impaired Eating/Feeding: Set up;Sitting   Grooming: Supervision/safety;Standing Grooming Details (indicate cue type and reason): Educated on use of 2 cups for oral care. Upper Body Bathing: Set up;Sitting   Lower Body Bathing: Supervison/ safety;Sit to/from stand   Upper Body Dressing : Set up;Sitting   Lower Body Dressing: Supervision/safety;Sit to/from stand Lower Body Dressing Details (indicate cue type and reason): Pt able to cross foot over opposite knee, educated on compensatory strategies for LB ADL. Toilet Transfer: Supervision/safety;Ambulation;BSC;RW Toilet Transfer Details (indicate cue type and reason): Simulated by sit to stand from chair, educated on use of 3 in 1 over toilet   Toileting - Clothing Manipulation Details (indicate cue type and reason): Educated pt on proper technique for peri care without twisting and use of wet wipes Tub/ Shower Transfer: Supervision/safety;Tub transfer;Ambulation Tub/Shower Transfer Details (indicate cue type and reason): simulated tub transfer in room with supervision, recommend supervision initially upon return home--pt verbalized understanding Functional mobility during ADLs: Supervision/safety;Rolling walker General ADL Comments: Educated pt on maintaining back precautions during functional activities, keeping frequently used items at counter top height, and frequent mobility thorughout the day upon return home.     Vision         Perception     Praxis      Pertinent Vitals/Pain Pain Assessment: 0-10 Pain Score: 8  Faces Pain Scale: Hurts little more Pain Location: back Pain Descriptors / Indicators: Aching;Sore Pain Intervention(s): Monitored during session     Hand Dominance Right   Extremity/Trunk Assessment Upper  Extremity Assessment Upper Extremity Assessment: Overall WFL for tasks assessed   Lower Extremity Assessment Lower  Extremity Assessment: Defer to PT evaluation   Cervical / Trunk Assessment Cervical / Trunk Assessment: Other exceptions Cervical / Trunk Exceptions: s/p PLIF    Communication Communication Communication: No difficulties   Cognition Arousal/Alertness: Awake/alert Behavior During Therapy: WFL for tasks assessed/performed Overall Cognitive Status: Within Functional Limits for tasks assessed                                     General Comments       Exercises     Shoulder Instructions      Home Living Family/patient expects to be discharged to:: Private residence Living Arrangements: Spouse/significant other Available Help at Discharge: Family;Available 24 hours/day Type of Home: House Home Access: Stairs to enter [employer] of Steps: 5 Entrance Stairs-Rails: Right;Left Home Layout: One level     Bathroom Shower/Tub: Corporate investment banker: Standard     Home Equipment: None          Prior Functioning/Environment Level of Independence: Independent                 OT Problem List:        OT Treatment/Interventions:      OT Goals(Current goals can be found in the care plan section) Acute Rehab OT Goals Patient Stated Goal: to feel better  OT Goal Formulation: All assessment and education complete, DC therapy  OT Frequency:     Barriers to D/C:            Co-evaluation              AM-PAC PT "6 Clicks" Daily Activity     Outcome Measure Help from another person eating meals?: None Help from another person taking care of personal grooming?: A Little Help from another person toileting, which includes using toliet, bedpan, or urinal?: A Little Help from another person bathing (including washing, rinsing, drying)?: A Little Help from another person to put on and taking off regular upper body clothing?: None Help from another person to put on and taking off regular lower body clothing?: A Little 6  Click Score: 20   End of Session Equipment Utilized During Treatment: Back brace;Rolling walker Nurse Communication: Mobility status;Other (comment)(equipment and f/u needs)  Activity Tolerance: Patient tolerated treatment well Patient left: in chair;with call bell/phone within reach  OT Visit Diagnosis: Unsteadiness on feet (R26.81)                Time: 4888-9169 OT Time Calculation (min): 12 min Charges:  OT General Charges $OT Visit: 1 Visit OT Evaluation $OT Eval Moderate Complexity: 1 Mod G-Codes:     Leontyne Manville A. Ulice Brilliant, M.S., OTR/L Pager: [place] 10/27/2017, 9:42 AM

## 2017-10-27 NOTE — Progress Notes (Signed)
Pt doing well. Pt given D/C instructions with Rx's, verbal understanding was provided. Pt's incision is clean and dry with no sign of infection. Pt's IV was removed prior to D/C. Pt D/C'd home via wheelchair @ 1300 per MD order. Pt is stable @ D/C and has no other needs at this time. Holli Humbles, RN

## 2017-10-27 NOTE — Discharge Instructions (Signed)

## 2017-10-27 NOTE — Progress Notes (Signed)
Pt received RW and 3-n-1 from [facility] prior to D/C. Mella Inclan, RN 

## 2017-11-12 DIAGNOSIS — R6889 Other general symptoms and signs: Secondary | ICD-10-CM | POA: Diagnosis not present

## 2017-11-12 DIAGNOSIS — N390 Urinary tract infection, site not specified: Secondary | ICD-10-CM | POA: Diagnosis not present

## 2017-12-01 DIAGNOSIS — M48062 Spinal stenosis, lumbar region with neurogenic claudication: Secondary | ICD-10-CM | POA: Diagnosis not present

## 2017-12-13 DIAGNOSIS — M109 Gout, unspecified: Secondary | ICD-10-CM | POA: Diagnosis not present

## 2017-12-13 DIAGNOSIS — R1111 Vomiting without nausea: Secondary | ICD-10-CM | POA: Diagnosis not present

## 2017-12-13 DIAGNOSIS — R112 Nausea with vomiting, unspecified: Secondary | ICD-10-CM | POA: Diagnosis not present

## 2017-12-13 DIAGNOSIS — I129 Hypertensive chronic kidney disease with stage 1 through stage 4 chronic kidney disease, or unspecified chronic kidney disease: Secondary | ICD-10-CM | POA: Diagnosis not present

## 2017-12-13 DIAGNOSIS — M1990 Unspecified osteoarthritis, unspecified site: Secondary | ICD-10-CM | POA: Diagnosis not present

## 2017-12-13 DIAGNOSIS — K5289 Other specified noninfective gastroenteritis and colitis: Secondary | ICD-10-CM | POA: Diagnosis not present

## 2017-12-13 DIAGNOSIS — R590 Localized enlarged lymph nodes: Secondary | ICD-10-CM | POA: Diagnosis not present

## 2017-12-13 DIAGNOSIS — N189 Chronic kidney disease, unspecified: Secondary | ICD-10-CM | POA: Diagnosis not present

## 2017-12-13 DIAGNOSIS — E860 Dehydration: Secondary | ICD-10-CM | POA: Diagnosis not present

## 2017-12-13 DIAGNOSIS — Z79899 Other long term (current) drug therapy: Secondary | ICD-10-CM | POA: Diagnosis not present

## 2017-12-13 DIAGNOSIS — N179 Acute kidney failure, unspecified: Secondary | ICD-10-CM | POA: Diagnosis not present

## 2017-12-16 DIAGNOSIS — E860 Dehydration: Secondary | ICD-10-CM | POA: Diagnosis not present

## 2017-12-16 DIAGNOSIS — R112 Nausea with vomiting, unspecified: Secondary | ICD-10-CM | POA: Diagnosis not present

## 2017-12-16 DIAGNOSIS — N179 Acute kidney failure, unspecified: Secondary | ICD-10-CM | POA: Diagnosis not present

## 2017-12-22 ENCOUNTER — Other Ambulatory Visit: Payer: Self-pay | Admitting: *Deleted

## 2017-12-22 DIAGNOSIS — N289 Disorder of kidney and ureter, unspecified: Secondary | ICD-10-CM | POA: Diagnosis not present

## 2017-12-22 DIAGNOSIS — Z6827 Body mass index (BMI) 27.0-27.9, adult: Secondary | ICD-10-CM | POA: Diagnosis not present

## 2017-12-22 DIAGNOSIS — Z79899 Other long term (current) drug therapy: Secondary | ICD-10-CM | POA: Diagnosis not present

## 2017-12-22 DIAGNOSIS — R110 Nausea: Secondary | ICD-10-CM | POA: Diagnosis not present

## 2017-12-22 DIAGNOSIS — Z1331 Encounter for screening for depression: Secondary | ICD-10-CM | POA: Diagnosis not present

## 2017-12-22 DIAGNOSIS — M109 Gout, unspecified: Secondary | ICD-10-CM | POA: Diagnosis not present

## 2017-12-22 DIAGNOSIS — E869 Volume depletion, unspecified: Secondary | ICD-10-CM | POA: Diagnosis not present

## 2017-12-22 DIAGNOSIS — R590 Localized enlarged lymph nodes: Secondary | ICD-10-CM | POA: Diagnosis not present

## 2017-12-22 DIAGNOSIS — F411 Generalized anxiety disorder: Secondary | ICD-10-CM | POA: Diagnosis not present

## 2017-12-22 NOTE — Patient Outreach (Signed)
[name]) Care Management  12/22/2017  Aaron Houston 07-Jul-1946 [id number]  Referral via [employer]: member discharged from inpatient admission from [facility] on 12/16/2017.   "TOC will be completed by primary care provider office who will refer to [name] care management if needed."  Plan: Send to care management assistant for case closure.  Sherrin Daisy, RN BSN [place] Management Coordinator [facility] Care Management  [phone]

## 2017-12-29 DIAGNOSIS — M48062 Spinal stenosis, lumbar region with neurogenic claudication: Secondary | ICD-10-CM | POA: Diagnosis not present

## 2018-01-04 DIAGNOSIS — K529 Noninfective gastroenteritis and colitis, unspecified: Secondary | ICD-10-CM | POA: Diagnosis not present

## 2018-01-04 DIAGNOSIS — K219 Gastro-esophageal reflux disease without esophagitis: Secondary | ICD-10-CM | POA: Diagnosis not present

## 2018-01-04 DIAGNOSIS — R112 Nausea with vomiting, unspecified: Secondary | ICD-10-CM | POA: Diagnosis not present

## 2018-01-04 DIAGNOSIS — R590 Localized enlarged lymph nodes: Secondary | ICD-10-CM | POA: Diagnosis not present

## 2018-02-02 DIAGNOSIS — M48062 Spinal stenosis, lumbar region with neurogenic claudication: Secondary | ICD-10-CM | POA: Diagnosis not present

## 2018-02-02 DIAGNOSIS — Z6828 Body mass index (BMI) 28.0-28.9, adult: Secondary | ICD-10-CM | POA: Diagnosis not present

## 2018-02-02 DIAGNOSIS — I10 Essential (primary) hypertension: Secondary | ICD-10-CM | POA: Diagnosis not present

## 2018-03-11 DIAGNOSIS — M48062 Spinal stenosis, lumbar region with neurogenic claudication: Secondary | ICD-10-CM | POA: Diagnosis not present

## 2018-04-13 DIAGNOSIS — Z6827 Body mass index (BMI) 27.0-27.9, adult: Secondary | ICD-10-CM | POA: Diagnosis not present

## 2018-04-13 DIAGNOSIS — M48062 Spinal stenosis, lumbar region with neurogenic claudication: Secondary | ICD-10-CM | POA: Diagnosis not present

## 2018-04-13 DIAGNOSIS — I10 Essential (primary) hypertension: Secondary | ICD-10-CM | POA: Diagnosis not present

## 2018-06-21 DIAGNOSIS — Z125 Encounter for screening for malignant neoplasm of prostate: Secondary | ICD-10-CM | POA: Diagnosis not present

## 2018-06-21 DIAGNOSIS — Z0000 Encounter for general adult medical examination without abnormal findings: Secondary | ICD-10-CM | POA: Diagnosis not present

## 2018-06-21 DIAGNOSIS — E785 Hyperlipidemia, unspecified: Secondary | ICD-10-CM | POA: Diagnosis not present

## 2018-06-21 DIAGNOSIS — Z9181 History of falling: Secondary | ICD-10-CM | POA: Diagnosis not present

## 2018-06-21 DIAGNOSIS — M159 Polyosteoarthritis, unspecified: Secondary | ICD-10-CM | POA: Diagnosis not present

## 2018-06-21 DIAGNOSIS — Z1211 Encounter for screening for malignant neoplasm of colon: Secondary | ICD-10-CM | POA: Diagnosis not present

## 2018-06-21 DIAGNOSIS — Z139 Encounter for screening, unspecified: Secondary | ICD-10-CM | POA: Diagnosis not present

## 2018-06-21 DIAGNOSIS — Z1339 Encounter for screening examination for other mental health and behavioral disorders: Secondary | ICD-10-CM | POA: Diagnosis not present

## 2018-06-21 DIAGNOSIS — Z79899 Other long term (current) drug therapy: Secondary | ICD-10-CM | POA: Diagnosis not present

## 2018-06-21 DIAGNOSIS — Z6826 Body mass index (BMI) 26.0-26.9, adult: Secondary | ICD-10-CM | POA: Diagnosis not present

## 2018-06-23 ENCOUNTER — Encounter: Payer: Self-pay | Admitting: Gastroenterology

## 2018-08-16 ENCOUNTER — Encounter: Payer: PPO | Admitting: Gastroenterology

## 2018-09-08 ENCOUNTER — Encounter: Payer: PPO | Admitting: Gastroenterology

## 2018-10-03 DIAGNOSIS — E663 Overweight: Secondary | ICD-10-CM | POA: Diagnosis not present

## 2018-10-03 DIAGNOSIS — N183 Chronic kidney disease, stage 3 (moderate): Secondary | ICD-10-CM | POA: Diagnosis not present

## 2018-10-03 DIAGNOSIS — K219 Gastro-esophageal reflux disease without esophagitis: Secondary | ICD-10-CM | POA: Diagnosis not present

## 2018-10-03 DIAGNOSIS — Z79899 Other long term (current) drug therapy: Secondary | ICD-10-CM | POA: Diagnosis not present

## 2018-10-03 DIAGNOSIS — M159 Polyosteoarthritis, unspecified: Secondary | ICD-10-CM | POA: Diagnosis not present

## 2018-10-03 DIAGNOSIS — F411 Generalized anxiety disorder: Secondary | ICD-10-CM | POA: Diagnosis not present

## 2018-10-03 DIAGNOSIS — I10 Essential (primary) hypertension: Secondary | ICD-10-CM | POA: Diagnosis not present

## 2018-10-03 DIAGNOSIS — Z6826 Body mass index (BMI) 26.0-26.9, adult: Secondary | ICD-10-CM | POA: Diagnosis not present

## 2018-10-03 DIAGNOSIS — Z23 Encounter for immunization: Secondary | ICD-10-CM | POA: Diagnosis not present

## 2018-10-03 DIAGNOSIS — J45909 Unspecified asthma, uncomplicated: Secondary | ICD-10-CM | POA: Diagnosis not present

## 2018-10-03 DIAGNOSIS — E785 Hyperlipidemia, unspecified: Secondary | ICD-10-CM | POA: Diagnosis not present

## 2019-01-03 DIAGNOSIS — N183 Chronic kidney disease, stage 3 (moderate): Secondary | ICD-10-CM | POA: Diagnosis not present

## 2019-01-03 DIAGNOSIS — I10 Essential (primary) hypertension: Secondary | ICD-10-CM | POA: Diagnosis not present

## 2019-01-03 DIAGNOSIS — M159 Polyosteoarthritis, unspecified: Secondary | ICD-10-CM | POA: Diagnosis not present

## 2019-01-03 DIAGNOSIS — Z139 Encounter for screening, unspecified: Secondary | ICD-10-CM | POA: Diagnosis not present

## 2019-01-03 DIAGNOSIS — E785 Hyperlipidemia, unspecified: Secondary | ICD-10-CM | POA: Diagnosis not present

## 2019-01-03 DIAGNOSIS — Z1331 Encounter for screening for depression: Secondary | ICD-10-CM | POA: Diagnosis not present

## 2019-01-03 DIAGNOSIS — F411 Generalized anxiety disorder: Secondary | ICD-10-CM | POA: Diagnosis not present

## 2019-01-03 DIAGNOSIS — Z9181 History of falling: Secondary | ICD-10-CM | POA: Diagnosis not present

## 2019-01-03 DIAGNOSIS — J45909 Unspecified asthma, uncomplicated: Secondary | ICD-10-CM | POA: Diagnosis not present

## 2019-01-03 DIAGNOSIS — Z6826 Body mass index (BMI) 26.0-26.9, adult: Secondary | ICD-10-CM | POA: Diagnosis not present

## 2019-01-03 DIAGNOSIS — K219 Gastro-esophageal reflux disease without esophagitis: Secondary | ICD-10-CM | POA: Diagnosis not present

## 2019-01-03 DIAGNOSIS — Z79899 Other long term (current) drug therapy: Secondary | ICD-10-CM | POA: Diagnosis not present

## 2019-04-04 DIAGNOSIS — F411 Generalized anxiety disorder: Secondary | ICD-10-CM | POA: Diagnosis not present

## 2019-04-04 DIAGNOSIS — Z79899 Other long term (current) drug therapy: Secondary | ICD-10-CM | POA: Diagnosis not present

## 2019-04-04 DIAGNOSIS — E785 Hyperlipidemia, unspecified: Secondary | ICD-10-CM | POA: Diagnosis not present

## 2019-04-04 DIAGNOSIS — J45909 Unspecified asthma, uncomplicated: Secondary | ICD-10-CM | POA: Diagnosis not present

## 2019-04-04 DIAGNOSIS — I10 Essential (primary) hypertension: Secondary | ICD-10-CM | POA: Diagnosis not present

## 2019-04-04 DIAGNOSIS — R42 Dizziness and giddiness: Secondary | ICD-10-CM | POA: Diagnosis not present

## 2019-04-04 DIAGNOSIS — N183 Chronic kidney disease, stage 3 (moderate): Secondary | ICD-10-CM | POA: Diagnosis not present

## 2019-04-04 DIAGNOSIS — K219 Gastro-esophageal reflux disease without esophagitis: Secondary | ICD-10-CM | POA: Diagnosis not present

## 2019-05-30 DIAGNOSIS — S61211A Laceration without foreign body of left index finger without damage to nail, initial encounter: Secondary | ICD-10-CM | POA: Diagnosis not present

## 2019-06-01 DIAGNOSIS — S61219A Laceration without foreign body of unspecified finger without damage to nail, initial encounter: Secondary | ICD-10-CM | POA: Diagnosis not present

## 2019-06-12 DIAGNOSIS — S61219A Laceration without foreign body of unspecified finger without damage to nail, initial encounter: Secondary | ICD-10-CM | POA: Diagnosis not present

## 2019-06-28 DIAGNOSIS — Z1331 Encounter for screening for depression: Secondary | ICD-10-CM | POA: Diagnosis not present

## 2019-06-28 DIAGNOSIS — Z125 Encounter for screening for malignant neoplasm of prostate: Secondary | ICD-10-CM | POA: Diagnosis not present

## 2019-06-28 DIAGNOSIS — R079 Chest pain, unspecified: Secondary | ICD-10-CM | POA: Diagnosis not present

## 2019-06-28 DIAGNOSIS — Z0000 Encounter for general adult medical examination without abnormal findings: Secondary | ICD-10-CM | POA: Diagnosis not present

## 2019-06-28 DIAGNOSIS — Z9181 History of falling: Secondary | ICD-10-CM | POA: Diagnosis not present

## 2019-06-28 DIAGNOSIS — E785 Hyperlipidemia, unspecified: Secondary | ICD-10-CM | POA: Diagnosis not present

## 2019-06-28 DIAGNOSIS — I739 Peripheral vascular disease, unspecified: Secondary | ICD-10-CM | POA: Diagnosis not present

## 2019-06-28 DIAGNOSIS — I10 Essential (primary) hypertension: Secondary | ICD-10-CM | POA: Diagnosis not present

## 2019-06-28 DIAGNOSIS — Z23 Encounter for immunization: Secondary | ICD-10-CM | POA: Diagnosis not present

## 2019-06-28 DIAGNOSIS — K219 Gastro-esophageal reflux disease without esophagitis: Secondary | ICD-10-CM | POA: Diagnosis not present

## 2019-06-28 DIAGNOSIS — I2510 Atherosclerotic heart disease of native coronary artery without angina pectoris: Secondary | ICD-10-CM | POA: Diagnosis not present

## 2019-06-28 DIAGNOSIS — M159 Polyosteoarthritis, unspecified: Secondary | ICD-10-CM | POA: Diagnosis not present

## 2019-07-25 ENCOUNTER — Ambulatory Visit: Payer: PPO | Admitting: Cardiology

## 2019-08-16 DIAGNOSIS — R0602 Shortness of breath: Secondary | ICD-10-CM | POA: Diagnosis not present

## 2019-08-16 DIAGNOSIS — E785 Hyperlipidemia, unspecified: Secondary | ICD-10-CM | POA: Diagnosis not present

## 2019-08-16 DIAGNOSIS — I10 Essential (primary) hypertension: Secondary | ICD-10-CM | POA: Diagnosis not present

## 2019-08-16 DIAGNOSIS — I2510 Atherosclerotic heart disease of native coronary artery without angina pectoris: Secondary | ICD-10-CM | POA: Diagnosis not present

## 2019-08-16 DIAGNOSIS — H538 Other visual disturbances: Secondary | ICD-10-CM | POA: Diagnosis not present

## 2019-08-16 DIAGNOSIS — R0789 Other chest pain: Secondary | ICD-10-CM | POA: Diagnosis not present

## 2019-08-16 DIAGNOSIS — Z955 Presence of coronary angioplasty implant and graft: Secondary | ICD-10-CM | POA: Diagnosis not present

## 2019-08-28 DIAGNOSIS — I2510 Atherosclerotic heart disease of native coronary artery without angina pectoris: Secondary | ICD-10-CM | POA: Diagnosis not present

## 2019-08-28 DIAGNOSIS — R0789 Other chest pain: Secondary | ICD-10-CM | POA: Diagnosis not present

## 2019-09-01 DIAGNOSIS — H538 Other visual disturbances: Secondary | ICD-10-CM | POA: Diagnosis not present

## 2019-09-01 DIAGNOSIS — I6523 Occlusion and stenosis of bilateral carotid arteries: Secondary | ICD-10-CM | POA: Diagnosis not present

## 2019-10-04 DIAGNOSIS — I10 Essential (primary) hypertension: Secondary | ICD-10-CM | POA: Diagnosis not present

## 2019-10-04 DIAGNOSIS — E785 Hyperlipidemia, unspecified: Secondary | ICD-10-CM | POA: Diagnosis not present

## 2019-10-04 DIAGNOSIS — K219 Gastro-esophageal reflux disease without esophagitis: Secondary | ICD-10-CM | POA: Diagnosis not present

## 2019-10-04 DIAGNOSIS — Z6826 Body mass index (BMI) 26.0-26.9, adult: Secondary | ICD-10-CM | POA: Diagnosis not present

## 2019-10-04 DIAGNOSIS — J45909 Unspecified asthma, uncomplicated: Secondary | ICD-10-CM | POA: Diagnosis not present

## 2019-10-04 DIAGNOSIS — Z79899 Other long term (current) drug therapy: Secondary | ICD-10-CM | POA: Diagnosis not present

## 2019-10-04 DIAGNOSIS — Z23 Encounter for immunization: Secondary | ICD-10-CM | POA: Diagnosis not present

## 2019-10-04 DIAGNOSIS — M159 Polyosteoarthritis, unspecified: Secondary | ICD-10-CM | POA: Diagnosis not present

## 2019-10-04 DIAGNOSIS — F411 Generalized anxiety disorder: Secondary | ICD-10-CM | POA: Diagnosis not present

## 2019-11-19 ENCOUNTER — Ambulatory Visit: Payer: Medicare Other | Attending: Internal Medicine

## 2019-11-19 DIAGNOSIS — Z23 Encounter for immunization: Secondary | ICD-10-CM

## 2019-11-19 NOTE — Progress Notes (Signed)
   Covid-19 Vaccination Clinic  Name:  Aaron Houston    MRN: [id number] DOB: [birth year]  11/19/2019  [name] was observed post Covid-19 immunization for 15 minutes without incidence. He was provided with Vaccine Information Sheet and instruction to access the V-Safe system.   [name] was instructed to call 911 with any severe reactions post vaccine: Marland Kitchen Difficulty breathing  . Swelling of your face and throat  . A fast heartbeat  . A bad rash all over your body  . Dizziness and weakness    Immunizations Administered    Name Date Dose VIS Date Route   Pfizer COVID-19 Vaccine 11/19/2019  1:06 PM 0.3 mL 10/20/2019 Intramuscular   Manufacturer: [place]   Lot: [id number]   [place]: [id number]

## 2019-12-08 ENCOUNTER — Ambulatory Visit: Payer: PPO

## 2019-12-09 ENCOUNTER — Ambulatory Visit: Payer: PPO

## 2019-12-10 ENCOUNTER — Ambulatory Visit: Payer: PPO | Attending: Internal Medicine

## 2019-12-10 DIAGNOSIS — Z23 Encounter for immunization: Secondary | ICD-10-CM | POA: Insufficient documentation

## 2019-12-10 NOTE — Progress Notes (Signed)
   Covid-19 Vaccination Clinic  Name:  Aaron Houston    MRN: [id number] DOB: 1946-01-13  12/10/2019  Mr. Aaron Houston was observed post Covid-19 immunization for 15 minutes without incidence. He was provided with Vaccine Information Sheet and instruction to access the V-Safe system.   Mr. Aaron Houston was instructed to call 911 with any severe reactions post vaccine: Marland Kitchen Difficulty breathing  . Swelling of your face and throat  . A fast heartbeat  . A bad rash all over your body  . Dizziness and weakness    Immunizations Administered    Name Date Dose VIS Date Route   Pfizer COVID-19 Vaccine 12/10/2019 10:01 AM 0.3 mL 10/20/2019 Intramuscular   Manufacturer: [place]   Lot: [id number]   [place]: [id number]

## 2019-12-13 DIAGNOSIS — E785 Hyperlipidemia, unspecified: Secondary | ICD-10-CM | POA: Diagnosis not present

## 2019-12-13 DIAGNOSIS — I2510 Atherosclerotic heart disease of native coronary artery without angina pectoris: Secondary | ICD-10-CM | POA: Diagnosis not present

## 2019-12-13 DIAGNOSIS — I10 Essential (primary) hypertension: Secondary | ICD-10-CM | POA: Diagnosis not present

## 2019-12-13 DIAGNOSIS — Z955 Presence of coronary angioplasty implant and graft: Secondary | ICD-10-CM | POA: Diagnosis not present

## 2020-01-04 DIAGNOSIS — K219 Gastro-esophageal reflux disease without esophagitis: Secondary | ICD-10-CM | POA: Diagnosis not present

## 2020-01-04 DIAGNOSIS — Z79899 Other long term (current) drug therapy: Secondary | ICD-10-CM | POA: Diagnosis not present

## 2020-01-04 DIAGNOSIS — J45909 Unspecified asthma, uncomplicated: Secondary | ICD-10-CM | POA: Diagnosis not present

## 2020-01-04 DIAGNOSIS — F411 Generalized anxiety disorder: Secondary | ICD-10-CM | POA: Diagnosis not present

## 2020-01-04 DIAGNOSIS — E785 Hyperlipidemia, unspecified: Secondary | ICD-10-CM | POA: Diagnosis not present

## 2020-01-04 DIAGNOSIS — Z6826 Body mass index (BMI) 26.0-26.9, adult: Secondary | ICD-10-CM | POA: Diagnosis not present

## 2020-01-04 DIAGNOSIS — I10 Essential (primary) hypertension: Secondary | ICD-10-CM | POA: Diagnosis not present

## 2020-01-04 DIAGNOSIS — M159 Polyosteoarthritis, unspecified: Secondary | ICD-10-CM | POA: Diagnosis not present

## 2020-03-20 DIAGNOSIS — J209 Acute bronchitis, unspecified: Secondary | ICD-10-CM | POA: Diagnosis not present

## 2020-03-20 DIAGNOSIS — J0100 Acute maxillary sinusitis, unspecified: Secondary | ICD-10-CM | POA: Diagnosis not present

## 2020-03-30 DIAGNOSIS — S61412A Laceration without foreign body of left hand, initial encounter: Secondary | ICD-10-CM | POA: Diagnosis not present

## 2020-03-30 DIAGNOSIS — S61012A Laceration without foreign body of left thumb without damage to nail, initial encounter: Secondary | ICD-10-CM | POA: Diagnosis not present

## 2020-03-30 DIAGNOSIS — S50812A Abrasion of left forearm, initial encounter: Secondary | ICD-10-CM | POA: Diagnosis not present

## 2020-03-30 DIAGNOSIS — Z23 Encounter for immunization: Secondary | ICD-10-CM | POA: Diagnosis not present

## 2020-03-30 DIAGNOSIS — S51812A Laceration without foreign body of left forearm, initial encounter: Secondary | ICD-10-CM | POA: Diagnosis not present

## 2020-03-30 DIAGNOSIS — S6992XA Unspecified injury of left wrist, hand and finger(s), initial encounter: Secondary | ICD-10-CM | POA: Diagnosis not present

## 2020-04-22 DIAGNOSIS — F411 Generalized anxiety disorder: Secondary | ICD-10-CM | POA: Diagnosis not present

## 2020-04-22 DIAGNOSIS — M7022 Olecranon bursitis, left elbow: Secondary | ICD-10-CM | POA: Diagnosis not present

## 2020-04-22 DIAGNOSIS — J45909 Unspecified asthma, uncomplicated: Secondary | ICD-10-CM | POA: Diagnosis not present

## 2020-04-22 DIAGNOSIS — Z79899 Other long term (current) drug therapy: Secondary | ICD-10-CM | POA: Diagnosis not present

## 2020-04-22 DIAGNOSIS — F5104 Psychophysiologic insomnia: Secondary | ICD-10-CM | POA: Diagnosis not present

## 2020-04-22 DIAGNOSIS — E785 Hyperlipidemia, unspecified: Secondary | ICD-10-CM | POA: Diagnosis not present

## 2020-04-22 DIAGNOSIS — Z6826 Body mass index (BMI) 26.0-26.9, adult: Secondary | ICD-10-CM | POA: Diagnosis not present

## 2020-04-22 DIAGNOSIS — I739 Peripheral vascular disease, unspecified: Secondary | ICD-10-CM | POA: Diagnosis not present

## 2020-04-22 DIAGNOSIS — Z139 Encounter for screening, unspecified: Secondary | ICD-10-CM | POA: Diagnosis not present

## 2020-04-22 DIAGNOSIS — K219 Gastro-esophageal reflux disease without esophagitis: Secondary | ICD-10-CM | POA: Diagnosis not present

## 2020-04-22 DIAGNOSIS — M159 Polyosteoarthritis, unspecified: Secondary | ICD-10-CM | POA: Diagnosis not present

## 2020-04-22 DIAGNOSIS — I10 Essential (primary) hypertension: Secondary | ICD-10-CM | POA: Diagnosis not present

## 2020-07-12 DIAGNOSIS — M79604 Pain in right leg: Secondary | ICD-10-CM | POA: Diagnosis not present

## 2020-07-12 DIAGNOSIS — I10 Essential (primary) hypertension: Secondary | ICD-10-CM | POA: Diagnosis not present

## 2020-07-12 DIAGNOSIS — I2510 Atherosclerotic heart disease of native coronary artery without angina pectoris: Secondary | ICD-10-CM | POA: Diagnosis not present

## 2020-07-12 DIAGNOSIS — M79605 Pain in left leg: Secondary | ICD-10-CM | POA: Diagnosis not present

## 2020-07-12 DIAGNOSIS — E785 Hyperlipidemia, unspecified: Secondary | ICD-10-CM | POA: Diagnosis not present

## 2020-07-12 DIAGNOSIS — Z955 Presence of coronary angioplasty implant and graft: Secondary | ICD-10-CM | POA: Diagnosis not present

## 2020-08-14 DIAGNOSIS — I2510 Atherosclerotic heart disease of native coronary artery without angina pectoris: Secondary | ICD-10-CM | POA: Diagnosis not present

## 2020-08-14 DIAGNOSIS — M79605 Pain in left leg: Secondary | ICD-10-CM | POA: Diagnosis not present

## 2020-08-14 DIAGNOSIS — M79604 Pain in right leg: Secondary | ICD-10-CM | POA: Diagnosis not present

## 2020-08-14 DIAGNOSIS — I10 Essential (primary) hypertension: Secondary | ICD-10-CM | POA: Diagnosis not present

## 2020-08-16 DIAGNOSIS — M19041 Primary osteoarthritis, right hand: Secondary | ICD-10-CM | POA: Diagnosis not present

## 2020-08-16 DIAGNOSIS — M79641 Pain in right hand: Secondary | ICD-10-CM | POA: Diagnosis not present

## 2020-08-22 DIAGNOSIS — Z23 Encounter for immunization: Secondary | ICD-10-CM | POA: Diagnosis not present

## 2020-08-22 DIAGNOSIS — Z79899 Other long term (current) drug therapy: Secondary | ICD-10-CM | POA: Diagnosis not present

## 2020-08-22 DIAGNOSIS — J45909 Unspecified asthma, uncomplicated: Secondary | ICD-10-CM | POA: Diagnosis not present

## 2020-08-22 DIAGNOSIS — Z9181 History of falling: Secondary | ICD-10-CM | POA: Diagnosis not present

## 2020-08-22 DIAGNOSIS — F411 Generalized anxiety disorder: Secondary | ICD-10-CM | POA: Diagnosis not present

## 2020-08-22 DIAGNOSIS — I739 Peripheral vascular disease, unspecified: Secondary | ICD-10-CM | POA: Diagnosis not present

## 2020-08-22 DIAGNOSIS — K219 Gastro-esophageal reflux disease without esophagitis: Secondary | ICD-10-CM | POA: Diagnosis not present

## 2020-08-22 DIAGNOSIS — Z1331 Encounter for screening for depression: Secondary | ICD-10-CM | POA: Diagnosis not present

## 2020-08-22 DIAGNOSIS — I2510 Atherosclerotic heart disease of native coronary artery without angina pectoris: Secondary | ICD-10-CM | POA: Diagnosis not present

## 2020-08-22 DIAGNOSIS — I10 Essential (primary) hypertension: Secondary | ICD-10-CM | POA: Diagnosis not present

## 2020-08-22 DIAGNOSIS — E785 Hyperlipidemia, unspecified: Secondary | ICD-10-CM | POA: Diagnosis not present

## 2020-08-22 DIAGNOSIS — F5104 Psychophysiologic insomnia: Secondary | ICD-10-CM | POA: Diagnosis not present

## 2020-10-07 DIAGNOSIS — M1711 Unilateral primary osteoarthritis, right knee: Secondary | ICD-10-CM | POA: Diagnosis not present

## 2020-10-16 DIAGNOSIS — N179 Acute kidney failure, unspecified: Secondary | ICD-10-CM | POA: Diagnosis not present

## 2020-10-16 DIAGNOSIS — K760 Fatty (change of) liver, not elsewhere classified: Secondary | ICD-10-CM | POA: Diagnosis not present

## 2020-10-16 DIAGNOSIS — Z955 Presence of coronary angioplasty implant and graft: Secondary | ICD-10-CM | POA: Diagnosis not present

## 2020-10-16 DIAGNOSIS — M109 Gout, unspecified: Secondary | ICD-10-CM | POA: Diagnosis not present

## 2020-10-16 DIAGNOSIS — I2510 Atherosclerotic heart disease of native coronary artery without angina pectoris: Secondary | ICD-10-CM | POA: Diagnosis not present

## 2020-10-16 DIAGNOSIS — I361 Nonrheumatic tricuspid (valve) insufficiency: Secondary | ICD-10-CM | POA: Diagnosis not present

## 2020-10-16 DIAGNOSIS — I252 Old myocardial infarction: Secondary | ICD-10-CM

## 2020-10-16 DIAGNOSIS — I493 Ventricular premature depolarization: Secondary | ICD-10-CM | POA: Diagnosis not present

## 2020-10-16 DIAGNOSIS — E785 Hyperlipidemia, unspecified: Secondary | ICD-10-CM | POA: Diagnosis not present

## 2020-10-16 DIAGNOSIS — I214 Non-ST elevation (NSTEMI) myocardial infarction: Secondary | ICD-10-CM | POA: Diagnosis not present

## 2020-10-16 DIAGNOSIS — Z79899 Other long term (current) drug therapy: Secondary | ICD-10-CM | POA: Diagnosis not present

## 2020-10-16 DIAGNOSIS — R008 Other abnormalities of heart beat: Secondary | ICD-10-CM | POA: Diagnosis not present

## 2020-10-16 DIAGNOSIS — R748 Abnormal levels of other serum enzymes: Secondary | ICD-10-CM | POA: Diagnosis not present

## 2020-10-16 DIAGNOSIS — R112 Nausea with vomiting, unspecified: Secondary | ICD-10-CM | POA: Diagnosis not present

## 2020-10-16 DIAGNOSIS — I2119 ST elevation (STEMI) myocardial infarction involving other coronary artery of inferior wall: Secondary | ICD-10-CM | POA: Diagnosis not present

## 2020-10-16 DIAGNOSIS — F419 Anxiety disorder, unspecified: Secondary | ICD-10-CM | POA: Diagnosis not present

## 2020-10-16 DIAGNOSIS — I25119 Atherosclerotic heart disease of native coronary artery with unspecified angina pectoris: Secondary | ICD-10-CM | POA: Diagnosis not present

## 2020-10-16 DIAGNOSIS — R9431 Abnormal electrocardiogram [ECG] [EKG]: Secondary | ICD-10-CM | POA: Diagnosis not present

## 2020-10-16 DIAGNOSIS — I10 Essential (primary) hypertension: Secondary | ICD-10-CM | POA: Diagnosis not present

## 2020-10-16 DIAGNOSIS — R079 Chest pain, unspecified: Secondary | ICD-10-CM | POA: Diagnosis not present

## 2020-10-16 DIAGNOSIS — F32A Depression, unspecified: Secondary | ICD-10-CM | POA: Diagnosis not present

## 2020-10-16 DIAGNOSIS — R001 Bradycardia, unspecified: Secondary | ICD-10-CM | POA: Diagnosis not present

## 2020-10-16 DIAGNOSIS — I129 Hypertensive chronic kidney disease with stage 1 through stage 4 chronic kidney disease, or unspecified chronic kidney disease: Secondary | ICD-10-CM | POA: Diagnosis not present

## 2020-10-16 DIAGNOSIS — R0789 Other chest pain: Secondary | ICD-10-CM | POA: Diagnosis not present

## 2020-10-16 DIAGNOSIS — Z87442 Personal history of urinary calculi: Secondary | ICD-10-CM | POA: Diagnosis not present

## 2020-10-16 DIAGNOSIS — K219 Gastro-esophageal reflux disease without esophagitis: Secondary | ICD-10-CM | POA: Diagnosis not present

## 2020-10-16 DIAGNOSIS — I480 Paroxysmal atrial fibrillation: Secondary | ICD-10-CM | POA: Diagnosis not present

## 2020-10-16 DIAGNOSIS — I1310 Hypertensive heart and chronic kidney disease without heart failure, with stage 1 through stage 4 chronic kidney disease, or unspecified chronic kidney disease: Secondary | ICD-10-CM | POA: Diagnosis not present

## 2020-10-16 DIAGNOSIS — N183 Chronic kidney disease, stage 3 unspecified: Secondary | ICD-10-CM | POA: Diagnosis not present

## 2020-10-16 DIAGNOSIS — N189 Chronic kidney disease, unspecified: Secondary | ICD-10-CM | POA: Diagnosis not present

## 2020-10-16 HISTORY — DX: Old myocardial infarction: I25.2

## 2020-10-17 DIAGNOSIS — I10 Essential (primary) hypertension: Secondary | ICD-10-CM | POA: Diagnosis not present

## 2020-10-17 DIAGNOSIS — I361 Nonrheumatic tricuspid (valve) insufficiency: Secondary | ICD-10-CM | POA: Diagnosis not present

## 2020-10-17 DIAGNOSIS — R001 Bradycardia, unspecified: Secondary | ICD-10-CM | POA: Diagnosis not present

## 2020-10-17 DIAGNOSIS — M109 Gout, unspecified: Secondary | ICD-10-CM | POA: Diagnosis not present

## 2020-10-17 DIAGNOSIS — K760 Fatty (change of) liver, not elsewhere classified: Secondary | ICD-10-CM | POA: Diagnosis not present

## 2020-10-17 DIAGNOSIS — I2510 Atherosclerotic heart disease of native coronary artery without angina pectoris: Secondary | ICD-10-CM | POA: Diagnosis not present

## 2020-10-17 DIAGNOSIS — N179 Acute kidney failure, unspecified: Secondary | ICD-10-CM | POA: Diagnosis not present

## 2020-10-17 DIAGNOSIS — I214 Non-ST elevation (NSTEMI) myocardial infarction: Secondary | ICD-10-CM | POA: Diagnosis not present

## 2020-10-17 DIAGNOSIS — E785 Hyperlipidemia, unspecified: Secondary | ICD-10-CM | POA: Diagnosis not present

## 2020-10-17 DIAGNOSIS — F419 Anxiety disorder, unspecified: Secondary | ICD-10-CM | POA: Diagnosis not present

## 2020-10-18 DIAGNOSIS — I493 Ventricular premature depolarization: Secondary | ICD-10-CM | POA: Diagnosis not present

## 2020-10-18 DIAGNOSIS — N179 Acute kidney failure, unspecified: Secondary | ICD-10-CM | POA: Diagnosis not present

## 2020-10-18 DIAGNOSIS — I214 Non-ST elevation (NSTEMI) myocardial infarction: Secondary | ICD-10-CM | POA: Diagnosis not present

## 2020-10-18 DIAGNOSIS — N189 Chronic kidney disease, unspecified: Secondary | ICD-10-CM | POA: Diagnosis not present

## 2020-10-18 DIAGNOSIS — M109 Gout, unspecified: Secondary | ICD-10-CM | POA: Diagnosis not present

## 2020-10-18 DIAGNOSIS — I1310 Hypertensive heart and chronic kidney disease without heart failure, with stage 1 through stage 4 chronic kidney disease, or unspecified chronic kidney disease: Secondary | ICD-10-CM | POA: Diagnosis not present

## 2020-10-18 DIAGNOSIS — R0789 Other chest pain: Secondary | ICD-10-CM | POA: Diagnosis not present

## 2020-10-18 DIAGNOSIS — M79661 Pain in right lower leg: Secondary | ICD-10-CM | POA: Diagnosis not present

## 2020-10-18 DIAGNOSIS — I480 Paroxysmal atrial fibrillation: Secondary | ICD-10-CM | POA: Diagnosis not present

## 2020-10-18 DIAGNOSIS — M25571 Pain in right ankle and joints of right foot: Secondary | ICD-10-CM | POA: Diagnosis not present

## 2020-10-18 DIAGNOSIS — I10 Essential (primary) hypertension: Secondary | ICD-10-CM | POA: Diagnosis not present

## 2020-10-18 DIAGNOSIS — Z955 Presence of coronary angioplasty implant and graft: Secondary | ICD-10-CM | POA: Diagnosis not present

## 2020-10-18 DIAGNOSIS — R008 Other abnormalities of heart beat: Secondary | ICD-10-CM | POA: Diagnosis not present

## 2020-10-18 DIAGNOSIS — R9431 Abnormal electrocardiogram [ECG] [EKG]: Secondary | ICD-10-CM | POA: Diagnosis not present

## 2020-10-18 DIAGNOSIS — R509 Fever, unspecified: Secondary | ICD-10-CM | POA: Diagnosis not present

## 2020-10-18 DIAGNOSIS — R001 Bradycardia, unspecified: Secondary | ICD-10-CM | POA: Diagnosis not present

## 2020-10-18 DIAGNOSIS — E785 Hyperlipidemia, unspecified: Secondary | ICD-10-CM | POA: Diagnosis not present

## 2020-10-18 DIAGNOSIS — I25119 Atherosclerotic heart disease of native coronary artery with unspecified angina pectoris: Secondary | ICD-10-CM | POA: Diagnosis not present

## 2020-10-18 DIAGNOSIS — I2119 ST elevation (STEMI) myocardial infarction involving other coronary artery of inferior wall: Secondary | ICD-10-CM | POA: Diagnosis not present

## 2020-10-18 DIAGNOSIS — I2510 Atherosclerotic heart disease of native coronary artery without angina pectoris: Secondary | ICD-10-CM | POA: Diagnosis not present

## 2020-10-19 DIAGNOSIS — R509 Fever, unspecified: Secondary | ICD-10-CM | POA: Diagnosis not present

## 2020-10-19 DIAGNOSIS — I214 Non-ST elevation (NSTEMI) myocardial infarction: Secondary | ICD-10-CM | POA: Diagnosis not present

## 2020-10-19 DIAGNOSIS — I10 Essential (primary) hypertension: Secondary | ICD-10-CM | POA: Diagnosis not present

## 2020-10-19 DIAGNOSIS — R001 Bradycardia, unspecified: Secondary | ICD-10-CM | POA: Diagnosis not present

## 2020-10-19 DIAGNOSIS — I2510 Atherosclerotic heart disease of native coronary artery without angina pectoris: Secondary | ICD-10-CM | POA: Diagnosis not present

## 2020-10-19 DIAGNOSIS — N179 Acute kidney failure, unspecified: Secondary | ICD-10-CM | POA: Diagnosis not present

## 2020-10-19 DIAGNOSIS — E785 Hyperlipidemia, unspecified: Secondary | ICD-10-CM | POA: Diagnosis not present

## 2020-10-19 DIAGNOSIS — R9431 Abnormal electrocardiogram [ECG] [EKG]: Secondary | ICD-10-CM | POA: Diagnosis not present

## 2020-10-19 DIAGNOSIS — R0789 Other chest pain: Secondary | ICD-10-CM | POA: Diagnosis not present

## 2020-10-20 DIAGNOSIS — I2510 Atherosclerotic heart disease of native coronary artery without angina pectoris: Secondary | ICD-10-CM | POA: Diagnosis not present

## 2020-10-20 DIAGNOSIS — R9431 Abnormal electrocardiogram [ECG] [EKG]: Secondary | ICD-10-CM | POA: Diagnosis not present

## 2020-10-20 DIAGNOSIS — I10 Essential (primary) hypertension: Secondary | ICD-10-CM | POA: Diagnosis not present

## 2020-10-20 DIAGNOSIS — R001 Bradycardia, unspecified: Secondary | ICD-10-CM | POA: Diagnosis not present

## 2020-10-20 DIAGNOSIS — I214 Non-ST elevation (NSTEMI) myocardial infarction: Secondary | ICD-10-CM | POA: Diagnosis not present

## 2020-10-20 DIAGNOSIS — R509 Fever, unspecified: Secondary | ICD-10-CM | POA: Diagnosis not present

## 2020-10-20 DIAGNOSIS — R0789 Other chest pain: Secondary | ICD-10-CM | POA: Diagnosis not present

## 2020-10-20 DIAGNOSIS — E785 Hyperlipidemia, unspecified: Secondary | ICD-10-CM | POA: Diagnosis not present

## 2020-10-20 DIAGNOSIS — N179 Acute kidney failure, unspecified: Secondary | ICD-10-CM | POA: Diagnosis not present

## 2020-10-21 DIAGNOSIS — I10 Essential (primary) hypertension: Secondary | ICD-10-CM | POA: Diagnosis not present

## 2020-10-21 DIAGNOSIS — I2119 ST elevation (STEMI) myocardial infarction involving other coronary artery of inferior wall: Secondary | ICD-10-CM | POA: Diagnosis not present

## 2020-10-21 DIAGNOSIS — N179 Acute kidney failure, unspecified: Secondary | ICD-10-CM | POA: Diagnosis not present

## 2020-10-21 DIAGNOSIS — M25571 Pain in right ankle and joints of right foot: Secondary | ICD-10-CM | POA: Diagnosis not present

## 2020-10-21 DIAGNOSIS — Z955 Presence of coronary angioplasty implant and graft: Secondary | ICD-10-CM | POA: Diagnosis not present

## 2020-10-21 DIAGNOSIS — E785 Hyperlipidemia, unspecified: Secondary | ICD-10-CM | POA: Diagnosis not present

## 2020-10-21 DIAGNOSIS — M79661 Pain in right lower leg: Secondary | ICD-10-CM | POA: Diagnosis not present

## 2020-10-21 DIAGNOSIS — I2510 Atherosclerotic heart disease of native coronary artery without angina pectoris: Secondary | ICD-10-CM | POA: Diagnosis not present

## 2020-10-22 DIAGNOSIS — I214 Non-ST elevation (NSTEMI) myocardial infarction: Secondary | ICD-10-CM | POA: Diagnosis not present

## 2020-10-22 DIAGNOSIS — R001 Bradycardia, unspecified: Secondary | ICD-10-CM | POA: Diagnosis not present

## 2020-10-22 DIAGNOSIS — I10 Essential (primary) hypertension: Secondary | ICD-10-CM | POA: Diagnosis not present

## 2020-10-22 DIAGNOSIS — E785 Hyperlipidemia, unspecified: Secondary | ICD-10-CM | POA: Diagnosis not present

## 2020-10-22 DIAGNOSIS — N179 Acute kidney failure, unspecified: Secondary | ICD-10-CM | POA: Diagnosis not present

## 2020-10-22 DIAGNOSIS — I2510 Atherosclerotic heart disease of native coronary artery without angina pectoris: Secondary | ICD-10-CM | POA: Diagnosis not present

## 2020-10-22 DIAGNOSIS — R509 Fever, unspecified: Secondary | ICD-10-CM | POA: Diagnosis not present

## 2020-10-23 DIAGNOSIS — N179 Acute kidney failure, unspecified: Secondary | ICD-10-CM | POA: Diagnosis not present

## 2020-10-23 DIAGNOSIS — I214 Non-ST elevation (NSTEMI) myocardial infarction: Secondary | ICD-10-CM | POA: Diagnosis not present

## 2020-10-23 DIAGNOSIS — I480 Paroxysmal atrial fibrillation: Secondary | ICD-10-CM | POA: Diagnosis not present

## 2020-10-23 DIAGNOSIS — R9431 Abnormal electrocardiogram [ECG] [EKG]: Secondary | ICD-10-CM | POA: Diagnosis not present

## 2020-10-23 DIAGNOSIS — N189 Chronic kidney disease, unspecified: Secondary | ICD-10-CM | POA: Diagnosis not present

## 2020-10-28 DIAGNOSIS — I1310 Hypertensive heart and chronic kidney disease without heart failure, with stage 1 through stage 4 chronic kidney disease, or unspecified chronic kidney disease: Secondary | ICD-10-CM | POA: Diagnosis not present

## 2020-10-28 DIAGNOSIS — R0602 Shortness of breath: Secondary | ICD-10-CM | POA: Diagnosis not present

## 2020-10-28 DIAGNOSIS — I214 Non-ST elevation (NSTEMI) myocardial infarction: Secondary | ICD-10-CM | POA: Diagnosis not present

## 2020-10-28 DIAGNOSIS — Z7982 Long term (current) use of aspirin: Secondary | ICD-10-CM | POA: Diagnosis not present

## 2020-10-28 DIAGNOSIS — Z7901 Long term (current) use of anticoagulants: Secondary | ICD-10-CM | POA: Diagnosis not present

## 2020-10-28 DIAGNOSIS — I400 Infective myocarditis: Secondary | ICD-10-CM | POA: Diagnosis not present

## 2020-10-28 DIAGNOSIS — D638 Anemia in other chronic diseases classified elsewhere: Secondary | ICD-10-CM | POA: Diagnosis not present

## 2020-10-28 DIAGNOSIS — Z955 Presence of coronary angioplasty implant and graft: Secondary | ICD-10-CM | POA: Diagnosis not present

## 2020-10-28 DIAGNOSIS — I2510 Atherosclerotic heart disease of native coronary artery without angina pectoris: Secondary | ICD-10-CM | POA: Diagnosis not present

## 2020-10-28 DIAGNOSIS — M79661 Pain in right lower leg: Secondary | ICD-10-CM | POA: Diagnosis not present

## 2020-10-28 DIAGNOSIS — I10 Essential (primary) hypertension: Secondary | ICD-10-CM | POA: Diagnosis not present

## 2020-10-28 DIAGNOSIS — N179 Acute kidney failure, unspecified: Secondary | ICD-10-CM | POA: Diagnosis not present

## 2020-10-28 DIAGNOSIS — D75839 Thrombocytosis, unspecified: Secondary | ICD-10-CM | POA: Diagnosis not present

## 2020-10-28 DIAGNOSIS — I491 Atrial premature depolarization: Secondary | ICD-10-CM | POA: Diagnosis not present

## 2020-10-28 DIAGNOSIS — N189 Chronic kidney disease, unspecified: Secondary | ICD-10-CM | POA: Diagnosis not present

## 2020-10-28 DIAGNOSIS — I082 Rheumatic disorders of both aortic and tricuspid valves: Secondary | ICD-10-CM | POA: Diagnosis not present

## 2020-10-28 DIAGNOSIS — E785 Hyperlipidemia, unspecified: Secondary | ICD-10-CM | POA: Diagnosis not present

## 2020-10-28 DIAGNOSIS — M109 Gout, unspecified: Secondary | ICD-10-CM | POA: Diagnosis not present

## 2020-10-28 DIAGNOSIS — Z79899 Other long term (current) drug therapy: Secondary | ICD-10-CM | POA: Diagnosis not present

## 2020-10-28 DIAGNOSIS — R0600 Dyspnea, unspecified: Secondary | ICD-10-CM | POA: Diagnosis not present

## 2020-10-28 DIAGNOSIS — M7989 Other specified soft tissue disorders: Secondary | ICD-10-CM | POA: Diagnosis not present

## 2020-10-28 DIAGNOSIS — I493 Ventricular premature depolarization: Secondary | ICD-10-CM | POA: Diagnosis not present

## 2020-10-28 DIAGNOSIS — I471 Supraventricular tachycardia: Secondary | ICD-10-CM | POA: Diagnosis not present

## 2020-10-28 DIAGNOSIS — D72821 Monocytosis (symptomatic): Secondary | ICD-10-CM | POA: Diagnosis not present

## 2020-10-28 DIAGNOSIS — I4891 Unspecified atrial fibrillation: Secondary | ICD-10-CM | POA: Diagnosis not present

## 2020-10-28 DIAGNOSIS — Z7902 Long term (current) use of antithrombotics/antiplatelets: Secondary | ICD-10-CM | POA: Diagnosis not present

## 2020-10-28 DIAGNOSIS — I480 Paroxysmal atrial fibrillation: Secondary | ICD-10-CM | POA: Diagnosis not present

## 2020-10-28 DIAGNOSIS — R531 Weakness: Secondary | ICD-10-CM | POA: Diagnosis not present

## 2020-10-28 DIAGNOSIS — I4892 Unspecified atrial flutter: Secondary | ICD-10-CM | POA: Diagnosis not present

## 2020-10-28 DIAGNOSIS — I252 Old myocardial infarction: Secondary | ICD-10-CM | POA: Diagnosis not present

## 2020-10-28 DIAGNOSIS — Z20822 Contact with and (suspected) exposure to covid-19: Secondary | ICD-10-CM | POA: Diagnosis not present

## 2020-10-28 DIAGNOSIS — R9431 Abnormal electrocardiogram [ECG] [EKG]: Secondary | ICD-10-CM | POA: Diagnosis not present

## 2020-10-28 DIAGNOSIS — R7401 Elevation of levels of liver transaminase levels: Secondary | ICD-10-CM | POA: Diagnosis not present

## 2020-10-29 DIAGNOSIS — R7401 Elevation of levels of liver transaminase levels: Secondary | ICD-10-CM | POA: Diagnosis not present

## 2020-10-29 DIAGNOSIS — I4891 Unspecified atrial fibrillation: Secondary | ICD-10-CM | POA: Diagnosis not present

## 2020-10-29 DIAGNOSIS — D72821 Monocytosis (symptomatic): Secondary | ICD-10-CM | POA: Diagnosis not present

## 2020-10-29 DIAGNOSIS — N189 Chronic kidney disease, unspecified: Secondary | ICD-10-CM | POA: Diagnosis not present

## 2020-10-29 DIAGNOSIS — M109 Gout, unspecified: Secondary | ICD-10-CM | POA: Diagnosis not present

## 2020-10-29 DIAGNOSIS — N179 Acute kidney failure, unspecified: Secondary | ICD-10-CM | POA: Diagnosis not present

## 2020-11-05 DIAGNOSIS — Z6824 Body mass index (BMI) 24.0-24.9, adult: Secondary | ICD-10-CM | POA: Diagnosis not present

## 2020-11-05 DIAGNOSIS — I4891 Unspecified atrial fibrillation: Secondary | ICD-10-CM | POA: Diagnosis not present

## 2020-11-05 DIAGNOSIS — R7989 Other specified abnormal findings of blood chemistry: Secondary | ICD-10-CM | POA: Diagnosis not present

## 2020-11-05 DIAGNOSIS — I2510 Atherosclerotic heart disease of native coronary artery without angina pectoris: Secondary | ICD-10-CM | POA: Diagnosis not present

## 2020-11-05 DIAGNOSIS — I252 Old myocardial infarction: Secondary | ICD-10-CM | POA: Diagnosis not present

## 2020-11-05 DIAGNOSIS — I10 Essential (primary) hypertension: Secondary | ICD-10-CM | POA: Diagnosis not present

## 2020-11-05 DIAGNOSIS — Z09 Encounter for follow-up examination after completed treatment for conditions other than malignant neoplasm: Secondary | ICD-10-CM | POA: Diagnosis not present

## 2020-11-07 DIAGNOSIS — I2510 Atherosclerotic heart disease of native coronary artery without angina pectoris: Secondary | ICD-10-CM | POA: Diagnosis not present

## 2020-11-07 DIAGNOSIS — Z7902 Long term (current) use of antithrombotics/antiplatelets: Secondary | ICD-10-CM | POA: Diagnosis not present

## 2020-11-07 DIAGNOSIS — Z48812 Encounter for surgical aftercare following surgery on the circulatory system: Secondary | ICD-10-CM | POA: Diagnosis not present

## 2020-11-07 DIAGNOSIS — N189 Chronic kidney disease, unspecified: Secondary | ICD-10-CM | POA: Diagnosis not present

## 2020-11-07 DIAGNOSIS — D631 Anemia in chronic kidney disease: Secondary | ICD-10-CM | POA: Diagnosis not present

## 2020-11-07 DIAGNOSIS — Z955 Presence of coronary angioplasty implant and graft: Secondary | ICD-10-CM | POA: Diagnosis not present

## 2020-11-07 DIAGNOSIS — Z7901 Long term (current) use of anticoagulants: Secondary | ICD-10-CM | POA: Diagnosis not present

## 2020-11-07 DIAGNOSIS — I214 Non-ST elevation (NSTEMI) myocardial infarction: Secondary | ICD-10-CM | POA: Diagnosis not present

## 2020-11-07 DIAGNOSIS — Z7982 Long term (current) use of aspirin: Secondary | ICD-10-CM | POA: Diagnosis not present

## 2020-11-07 DIAGNOSIS — M109 Gout, unspecified: Secondary | ICD-10-CM | POA: Diagnosis not present

## 2020-11-07 DIAGNOSIS — E785 Hyperlipidemia, unspecified: Secondary | ICD-10-CM | POA: Diagnosis not present

## 2020-11-07 DIAGNOSIS — F5104 Psychophysiologic insomnia: Secondary | ICD-10-CM | POA: Diagnosis not present

## 2020-11-07 DIAGNOSIS — I129 Hypertensive chronic kidney disease with stage 1 through stage 4 chronic kidney disease, or unspecified chronic kidney disease: Secondary | ICD-10-CM | POA: Diagnosis not present

## 2020-11-07 DIAGNOSIS — H538 Other visual disturbances: Secondary | ICD-10-CM | POA: Diagnosis not present

## 2020-11-07 DIAGNOSIS — I4891 Unspecified atrial fibrillation: Secondary | ICD-10-CM | POA: Diagnosis not present

## 2020-11-14 DIAGNOSIS — I214 Non-ST elevation (NSTEMI) myocardial infarction: Secondary | ICD-10-CM | POA: Diagnosis not present

## 2020-11-14 DIAGNOSIS — Z7901 Long term (current) use of anticoagulants: Secondary | ICD-10-CM | POA: Diagnosis not present

## 2020-11-14 DIAGNOSIS — D631 Anemia in chronic kidney disease: Secondary | ICD-10-CM | POA: Diagnosis not present

## 2020-11-14 DIAGNOSIS — I129 Hypertensive chronic kidney disease with stage 1 through stage 4 chronic kidney disease, or unspecified chronic kidney disease: Secondary | ICD-10-CM | POA: Diagnosis not present

## 2020-11-14 DIAGNOSIS — Z955 Presence of coronary angioplasty implant and graft: Secondary | ICD-10-CM | POA: Diagnosis not present

## 2020-11-14 DIAGNOSIS — Z7982 Long term (current) use of aspirin: Secondary | ICD-10-CM | POA: Diagnosis not present

## 2020-11-14 DIAGNOSIS — E785 Hyperlipidemia, unspecified: Secondary | ICD-10-CM | POA: Diagnosis not present

## 2020-11-14 DIAGNOSIS — Z48812 Encounter for surgical aftercare following surgery on the circulatory system: Secondary | ICD-10-CM | POA: Diagnosis not present

## 2020-11-14 DIAGNOSIS — H538 Other visual disturbances: Secondary | ICD-10-CM | POA: Diagnosis not present

## 2020-11-14 DIAGNOSIS — M109 Gout, unspecified: Secondary | ICD-10-CM | POA: Diagnosis not present

## 2020-11-14 DIAGNOSIS — I2510 Atherosclerotic heart disease of native coronary artery without angina pectoris: Secondary | ICD-10-CM | POA: Diagnosis not present

## 2020-11-14 DIAGNOSIS — I4891 Unspecified atrial fibrillation: Secondary | ICD-10-CM | POA: Diagnosis not present

## 2020-11-14 DIAGNOSIS — F5104 Psychophysiologic insomnia: Secondary | ICD-10-CM | POA: Diagnosis not present

## 2020-11-14 DIAGNOSIS — N189 Chronic kidney disease, unspecified: Secondary | ICD-10-CM | POA: Diagnosis not present

## 2020-11-14 DIAGNOSIS — Z7902 Long term (current) use of antithrombotics/antiplatelets: Secondary | ICD-10-CM | POA: Diagnosis not present

## 2020-11-18 DIAGNOSIS — I739 Peripheral vascular disease, unspecified: Secondary | ICD-10-CM | POA: Diagnosis not present

## 2020-11-18 DIAGNOSIS — M159 Polyosteoarthritis, unspecified: Secondary | ICD-10-CM | POA: Diagnosis not present

## 2020-11-18 DIAGNOSIS — M109 Gout, unspecified: Secondary | ICD-10-CM | POA: Diagnosis not present

## 2020-11-18 DIAGNOSIS — I10 Essential (primary) hypertension: Secondary | ICD-10-CM | POA: Diagnosis not present

## 2020-11-18 DIAGNOSIS — F411 Generalized anxiety disorder: Secondary | ICD-10-CM | POA: Diagnosis not present

## 2020-11-18 DIAGNOSIS — K219 Gastro-esophageal reflux disease without esophagitis: Secondary | ICD-10-CM | POA: Diagnosis not present

## 2020-11-18 DIAGNOSIS — I2510 Atherosclerotic heart disease of native coronary artery without angina pectoris: Secondary | ICD-10-CM | POA: Diagnosis not present

## 2020-11-18 DIAGNOSIS — F5104 Psychophysiologic insomnia: Secondary | ICD-10-CM | POA: Diagnosis not present

## 2020-11-18 DIAGNOSIS — J45909 Unspecified asthma, uncomplicated: Secondary | ICD-10-CM | POA: Diagnosis not present

## 2020-11-18 DIAGNOSIS — Z6824 Body mass index (BMI) 24.0-24.9, adult: Secondary | ICD-10-CM | POA: Diagnosis not present

## 2020-11-18 DIAGNOSIS — E785 Hyperlipidemia, unspecified: Secondary | ICD-10-CM | POA: Diagnosis not present

## 2020-11-19 DIAGNOSIS — D649 Anemia, unspecified: Secondary | ICD-10-CM | POA: Diagnosis not present

## 2020-11-19 DIAGNOSIS — Z79899 Other long term (current) drug therapy: Secondary | ICD-10-CM | POA: Diagnosis not present

## 2020-11-19 DIAGNOSIS — Z6824 Body mass index (BMI) 24.0-24.9, adult: Secondary | ICD-10-CM | POA: Diagnosis not present

## 2020-11-27 DIAGNOSIS — Z955 Presence of coronary angioplasty implant and graft: Secondary | ICD-10-CM | POA: Diagnosis not present

## 2020-11-27 DIAGNOSIS — I2510 Atherosclerotic heart disease of native coronary artery without angina pectoris: Secondary | ICD-10-CM | POA: Diagnosis not present

## 2020-11-27 DIAGNOSIS — E785 Hyperlipidemia, unspecified: Secondary | ICD-10-CM | POA: Diagnosis not present

## 2020-11-27 DIAGNOSIS — M79604 Pain in right leg: Secondary | ICD-10-CM | POA: Diagnosis not present

## 2020-11-27 DIAGNOSIS — I10 Essential (primary) hypertension: Secondary | ICD-10-CM | POA: Diagnosis not present

## 2020-11-27 DIAGNOSIS — I252 Old myocardial infarction: Secondary | ICD-10-CM | POA: Diagnosis not present

## 2020-12-11 DIAGNOSIS — M130 Polyarthritis, unspecified: Secondary | ICD-10-CM | POA: Diagnosis not present

## 2020-12-11 DIAGNOSIS — I252 Old myocardial infarction: Secondary | ICD-10-CM | POA: Diagnosis not present

## 2020-12-11 DIAGNOSIS — M109 Gout, unspecified: Secondary | ICD-10-CM | POA: Diagnosis not present

## 2020-12-11 DIAGNOSIS — I129 Hypertensive chronic kidney disease with stage 1 through stage 4 chronic kidney disease, or unspecified chronic kidney disease: Secondary | ICD-10-CM | POA: Diagnosis not present

## 2020-12-11 DIAGNOSIS — I2510 Atherosclerotic heart disease of native coronary artery without angina pectoris: Secondary | ICD-10-CM | POA: Diagnosis not present

## 2020-12-11 DIAGNOSIS — Z7902 Long term (current) use of antithrombotics/antiplatelets: Secondary | ICD-10-CM | POA: Diagnosis not present

## 2020-12-11 DIAGNOSIS — B9689 Other specified bacterial agents as the cause of diseases classified elsewhere: Secondary | ICD-10-CM | POA: Diagnosis not present

## 2020-12-11 DIAGNOSIS — I214 Non-ST elevation (NSTEMI) myocardial infarction: Secondary | ICD-10-CM | POA: Diagnosis not present

## 2020-12-11 DIAGNOSIS — Z79899 Other long term (current) drug therapy: Secondary | ICD-10-CM | POA: Diagnosis not present

## 2020-12-11 DIAGNOSIS — M19071 Primary osteoarthritis, right ankle and foot: Secondary | ICD-10-CM | POA: Diagnosis not present

## 2020-12-11 DIAGNOSIS — N179 Acute kidney failure, unspecified: Secondary | ICD-10-CM | POA: Diagnosis not present

## 2020-12-11 DIAGNOSIS — Z20822 Contact with and (suspected) exposure to covid-19: Secondary | ICD-10-CM | POA: Diagnosis not present

## 2020-12-11 DIAGNOSIS — M79604 Pain in right leg: Secondary | ICD-10-CM | POA: Diagnosis not present

## 2020-12-11 DIAGNOSIS — R509 Fever, unspecified: Secondary | ICD-10-CM | POA: Diagnosis not present

## 2020-12-11 DIAGNOSIS — N183 Chronic kidney disease, stage 3 unspecified: Secondary | ICD-10-CM | POA: Diagnosis not present

## 2020-12-11 DIAGNOSIS — D72821 Monocytosis (symptomatic): Secondary | ICD-10-CM | POA: Diagnosis not present

## 2020-12-11 DIAGNOSIS — I4891 Unspecified atrial fibrillation: Secondary | ICD-10-CM | POA: Diagnosis not present

## 2020-12-11 DIAGNOSIS — Z955 Presence of coronary angioplasty implant and graft: Secondary | ICD-10-CM | POA: Diagnosis not present

## 2020-12-11 DIAGNOSIS — M25571 Pain in right ankle and joints of right foot: Secondary | ICD-10-CM | POA: Diagnosis not present

## 2020-12-11 DIAGNOSIS — Z7901 Long term (current) use of anticoagulants: Secondary | ICD-10-CM | POA: Diagnosis not present

## 2020-12-11 DIAGNOSIS — N281 Cyst of kidney, acquired: Secondary | ICD-10-CM | POA: Diagnosis not present

## 2020-12-11 DIAGNOSIS — F419 Anxiety disorder, unspecified: Secondary | ICD-10-CM | POA: Diagnosis not present

## 2020-12-11 DIAGNOSIS — N390 Urinary tract infection, site not specified: Secondary | ICD-10-CM | POA: Diagnosis not present

## 2020-12-12 DIAGNOSIS — D72821 Monocytosis (symptomatic): Secondary | ICD-10-CM | POA: Diagnosis not present

## 2020-12-12 DIAGNOSIS — M109 Gout, unspecified: Secondary | ICD-10-CM | POA: Diagnosis not present

## 2020-12-12 DIAGNOSIS — R509 Fever, unspecified: Secondary | ICD-10-CM | POA: Diagnosis not present

## 2020-12-12 DIAGNOSIS — N179 Acute kidney failure, unspecified: Secondary | ICD-10-CM | POA: Diagnosis not present

## 2020-12-12 DIAGNOSIS — N183 Chronic kidney disease, stage 3 unspecified: Secondary | ICD-10-CM | POA: Diagnosis not present

## 2020-12-12 DIAGNOSIS — M1A9XX0 Chronic gout, unspecified, without tophus (tophi): Secondary | ICD-10-CM | POA: Diagnosis not present

## 2020-12-13 DIAGNOSIS — M1A9XX0 Chronic gout, unspecified, without tophus (tophi): Secondary | ICD-10-CM | POA: Diagnosis not present

## 2020-12-13 DIAGNOSIS — N183 Chronic kidney disease, stage 3 unspecified: Secondary | ICD-10-CM | POA: Diagnosis not present

## 2020-12-13 DIAGNOSIS — N179 Acute kidney failure, unspecified: Secondary | ICD-10-CM | POA: Diagnosis not present

## 2020-12-13 DIAGNOSIS — N390 Urinary tract infection, site not specified: Secondary | ICD-10-CM | POA: Diagnosis not present

## 2020-12-13 DIAGNOSIS — M79604 Pain in right leg: Secondary | ICD-10-CM | POA: Diagnosis not present

## 2020-12-17 DIAGNOSIS — Z6823 Body mass index (BMI) 23.0-23.9, adult: Secondary | ICD-10-CM | POA: Diagnosis not present

## 2020-12-17 DIAGNOSIS — Z79899 Other long term (current) drug therapy: Secondary | ICD-10-CM | POA: Diagnosis not present

## 2020-12-17 DIAGNOSIS — M1712 Unilateral primary osteoarthritis, left knee: Secondary | ICD-10-CM | POA: Diagnosis not present

## 2020-12-17 DIAGNOSIS — N179 Acute kidney failure, unspecified: Secondary | ICD-10-CM | POA: Diagnosis not present

## 2020-12-17 DIAGNOSIS — M109 Gout, unspecified: Secondary | ICD-10-CM | POA: Diagnosis not present

## 2020-12-30 DIAGNOSIS — I2510 Atherosclerotic heart disease of native coronary artery without angina pectoris: Secondary | ICD-10-CM | POA: Diagnosis not present

## 2020-12-30 DIAGNOSIS — I252 Old myocardial infarction: Secondary | ICD-10-CM | POA: Diagnosis not present

## 2020-12-30 DIAGNOSIS — Z79899 Other long term (current) drug therapy: Secondary | ICD-10-CM | POA: Diagnosis not present

## 2020-12-30 DIAGNOSIS — I10 Essential (primary) hypertension: Secondary | ICD-10-CM | POA: Diagnosis not present

## 2020-12-30 DIAGNOSIS — M109 Gout, unspecified: Secondary | ICD-10-CM | POA: Diagnosis not present

## 2020-12-30 DIAGNOSIS — Z7952 Long term (current) use of systemic steroids: Secondary | ICD-10-CM | POA: Diagnosis not present

## 2021-01-08 DIAGNOSIS — E785 Hyperlipidemia, unspecified: Secondary | ICD-10-CM | POA: Diagnosis not present

## 2021-01-08 DIAGNOSIS — I10 Essential (primary) hypertension: Secondary | ICD-10-CM | POA: Diagnosis not present

## 2021-01-08 DIAGNOSIS — Z955 Presence of coronary angioplasty implant and graft: Secondary | ICD-10-CM | POA: Diagnosis not present

## 2021-01-08 DIAGNOSIS — I252 Old myocardial infarction: Secondary | ICD-10-CM | POA: Diagnosis not present

## 2021-01-08 DIAGNOSIS — I2510 Atherosclerotic heart disease of native coronary artery without angina pectoris: Secondary | ICD-10-CM | POA: Diagnosis not present

## 2021-01-15 DIAGNOSIS — Z6823 Body mass index (BMI) 23.0-23.9, adult: Secondary | ICD-10-CM | POA: Diagnosis not present

## 2021-01-15 DIAGNOSIS — I2510 Atherosclerotic heart disease of native coronary artery without angina pectoris: Secondary | ICD-10-CM | POA: Diagnosis not present

## 2021-01-15 DIAGNOSIS — I10 Essential (primary) hypertension: Secondary | ICD-10-CM | POA: Diagnosis not present

## 2021-01-15 DIAGNOSIS — Z79899 Other long term (current) drug therapy: Secondary | ICD-10-CM | POA: Diagnosis not present

## 2021-01-27 DIAGNOSIS — M109 Gout, unspecified: Secondary | ICD-10-CM | POA: Diagnosis not present

## 2021-01-27 DIAGNOSIS — Z885 Allergy status to narcotic agent status: Secondary | ICD-10-CM | POA: Diagnosis not present

## 2021-01-27 DIAGNOSIS — Z79899 Other long term (current) drug therapy: Secondary | ICD-10-CM | POA: Diagnosis not present

## 2021-01-27 DIAGNOSIS — Z5181 Encounter for therapeutic drug level monitoring: Secondary | ICD-10-CM | POA: Diagnosis not present

## 2021-01-27 DIAGNOSIS — Z7952 Long term (current) use of systemic steroids: Secondary | ICD-10-CM | POA: Diagnosis not present

## 2021-01-29 DIAGNOSIS — E785 Hyperlipidemia, unspecified: Secondary | ICD-10-CM | POA: Diagnosis not present

## 2021-01-29 DIAGNOSIS — Z0000 Encounter for general adult medical examination without abnormal findings: Secondary | ICD-10-CM | POA: Diagnosis not present

## 2021-01-29 DIAGNOSIS — Z9181 History of falling: Secondary | ICD-10-CM | POA: Diagnosis not present

## 2021-01-29 DIAGNOSIS — Z1331 Encounter for screening for depression: Secondary | ICD-10-CM | POA: Diagnosis not present

## 2021-01-30 DIAGNOSIS — I2510 Atherosclerotic heart disease of native coronary artery without angina pectoris: Secondary | ICD-10-CM | POA: Diagnosis not present

## 2021-01-30 DIAGNOSIS — I252 Old myocardial infarction: Secondary | ICD-10-CM | POA: Diagnosis not present

## 2021-02-18 DIAGNOSIS — I4891 Unspecified atrial fibrillation: Secondary | ICD-10-CM | POA: Diagnosis not present

## 2021-02-18 DIAGNOSIS — D649 Anemia, unspecified: Secondary | ICD-10-CM | POA: Diagnosis not present

## 2021-02-18 DIAGNOSIS — I2510 Atherosclerotic heart disease of native coronary artery without angina pectoris: Secondary | ICD-10-CM | POA: Diagnosis not present

## 2021-02-18 DIAGNOSIS — R7989 Other specified abnormal findings of blood chemistry: Secondary | ICD-10-CM | POA: Diagnosis not present

## 2021-02-18 DIAGNOSIS — I10 Essential (primary) hypertension: Secondary | ICD-10-CM | POA: Diagnosis not present

## 2021-02-18 DIAGNOSIS — N183 Chronic kidney disease, stage 3 unspecified: Secondary | ICD-10-CM | POA: Diagnosis not present

## 2021-02-18 DIAGNOSIS — E785 Hyperlipidemia, unspecified: Secondary | ICD-10-CM | POA: Diagnosis not present

## 2021-02-24 DIAGNOSIS — I2510 Atherosclerotic heart disease of native coronary artery without angina pectoris: Secondary | ICD-10-CM | POA: Diagnosis not present

## 2021-02-24 DIAGNOSIS — I252 Old myocardial infarction: Secondary | ICD-10-CM | POA: Diagnosis not present

## 2021-02-24 DIAGNOSIS — Z7952 Long term (current) use of systemic steroids: Secondary | ICD-10-CM | POA: Diagnosis not present

## 2021-02-24 DIAGNOSIS — M109 Gout, unspecified: Secondary | ICD-10-CM | POA: Diagnosis not present

## 2021-02-24 DIAGNOSIS — I10 Essential (primary) hypertension: Secondary | ICD-10-CM | POA: Diagnosis not present

## 2021-02-24 DIAGNOSIS — Z79899 Other long term (current) drug therapy: Secondary | ICD-10-CM | POA: Diagnosis not present

## 2021-03-31 DIAGNOSIS — Z885 Allergy status to narcotic agent status: Secondary | ICD-10-CM | POA: Diagnosis not present

## 2021-03-31 DIAGNOSIS — Z7901 Long term (current) use of anticoagulants: Secondary | ICD-10-CM | POA: Diagnosis not present

## 2021-03-31 DIAGNOSIS — M1009 Idiopathic gout, multiple sites: Secondary | ICD-10-CM | POA: Diagnosis not present

## 2021-03-31 DIAGNOSIS — M109 Gout, unspecified: Secondary | ICD-10-CM | POA: Diagnosis not present

## 2021-03-31 DIAGNOSIS — Z981 Arthrodesis status: Secondary | ICD-10-CM | POA: Diagnosis not present

## 2021-03-31 DIAGNOSIS — Z955 Presence of coronary angioplasty implant and graft: Secondary | ICD-10-CM | POA: Diagnosis not present

## 2021-03-31 DIAGNOSIS — I272 Pulmonary hypertension, unspecified: Secondary | ICD-10-CM | POA: Diagnosis not present

## 2021-03-31 DIAGNOSIS — R627 Adult failure to thrive: Secondary | ICD-10-CM | POA: Diagnosis not present

## 2021-03-31 DIAGNOSIS — M1A09X1 Idiopathic chronic gout, multiple sites, with tophus (tophi): Secondary | ICD-10-CM | POA: Diagnosis not present

## 2021-03-31 DIAGNOSIS — R519 Headache, unspecified: Secondary | ICD-10-CM | POA: Diagnosis not present

## 2021-03-31 DIAGNOSIS — N390 Urinary tract infection, site not specified: Secondary | ICD-10-CM | POA: Diagnosis not present

## 2021-03-31 DIAGNOSIS — E785 Hyperlipidemia, unspecified: Secondary | ICD-10-CM | POA: Diagnosis not present

## 2021-03-31 DIAGNOSIS — U071 COVID-19: Secondary | ICD-10-CM | POA: Diagnosis not present

## 2021-03-31 DIAGNOSIS — I2510 Atherosclerotic heart disease of native coronary artery without angina pectoris: Secondary | ICD-10-CM | POA: Diagnosis not present

## 2021-03-31 DIAGNOSIS — I252 Old myocardial infarction: Secondary | ICD-10-CM | POA: Diagnosis not present

## 2021-03-31 DIAGNOSIS — N183 Chronic kidney disease, stage 3 unspecified: Secondary | ICD-10-CM | POA: Diagnosis not present

## 2021-03-31 DIAGNOSIS — M6281 Muscle weakness (generalized): Secondary | ICD-10-CM | POA: Diagnosis not present

## 2021-03-31 DIAGNOSIS — M25572 Pain in left ankle and joints of left foot: Secondary | ICD-10-CM | POA: Diagnosis not present

## 2021-03-31 DIAGNOSIS — M10072 Idiopathic gout, left ankle and foot: Secondary | ICD-10-CM | POA: Diagnosis not present

## 2021-03-31 DIAGNOSIS — Z7952 Long term (current) use of systemic steroids: Secondary | ICD-10-CM | POA: Diagnosis not present

## 2021-03-31 DIAGNOSIS — R5381 Other malaise: Secondary | ICD-10-CM | POA: Diagnosis not present

## 2021-03-31 DIAGNOSIS — D649 Anemia, unspecified: Secondary | ICD-10-CM | POA: Diagnosis not present

## 2021-03-31 DIAGNOSIS — Z6826 Body mass index (BMI) 26.0-26.9, adult: Secondary | ICD-10-CM | POA: Diagnosis not present

## 2021-03-31 DIAGNOSIS — I4891 Unspecified atrial fibrillation: Secondary | ICD-10-CM | POA: Diagnosis not present

## 2021-03-31 DIAGNOSIS — R8271 Bacteriuria: Secondary | ICD-10-CM | POA: Diagnosis not present

## 2021-03-31 DIAGNOSIS — M79672 Pain in left foot: Secondary | ICD-10-CM | POA: Diagnosis not present

## 2021-03-31 DIAGNOSIS — I129 Hypertensive chronic kidney disease with stage 1 through stage 4 chronic kidney disease, or unspecified chronic kidney disease: Secondary | ICD-10-CM | POA: Diagnosis not present

## 2021-03-31 DIAGNOSIS — M25672 Stiffness of left ankle, not elsewhere classified: Secondary | ICD-10-CM | POA: Diagnosis not present

## 2021-04-01 DIAGNOSIS — I2510 Atherosclerotic heart disease of native coronary artery without angina pectoris: Secondary | ICD-10-CM | POA: Diagnosis not present

## 2021-04-01 DIAGNOSIS — R627 Adult failure to thrive: Secondary | ICD-10-CM | POA: Diagnosis not present

## 2021-04-01 DIAGNOSIS — I4891 Unspecified atrial fibrillation: Secondary | ICD-10-CM | POA: Diagnosis not present

## 2021-04-01 DIAGNOSIS — U071 COVID-19: Secondary | ICD-10-CM | POA: Diagnosis not present

## 2021-04-01 DIAGNOSIS — M10072 Idiopathic gout, left ankle and foot: Secondary | ICD-10-CM | POA: Diagnosis not present

## 2021-04-01 DIAGNOSIS — Z7901 Long term (current) use of anticoagulants: Secondary | ICD-10-CM | POA: Diagnosis not present

## 2021-04-01 DIAGNOSIS — N183 Chronic kidney disease, stage 3 unspecified: Secondary | ICD-10-CM | POA: Diagnosis not present

## 2021-04-01 DIAGNOSIS — R519 Headache, unspecified: Secondary | ICD-10-CM | POA: Diagnosis not present

## 2021-04-02 DIAGNOSIS — Z7901 Long term (current) use of anticoagulants: Secondary | ICD-10-CM | POA: Diagnosis not present

## 2021-04-02 DIAGNOSIS — R627 Adult failure to thrive: Secondary | ICD-10-CM | POA: Diagnosis not present

## 2021-04-02 DIAGNOSIS — M6281 Muscle weakness (generalized): Secondary | ICD-10-CM | POA: Diagnosis not present

## 2021-04-02 DIAGNOSIS — I2510 Atherosclerotic heart disease of native coronary artery without angina pectoris: Secondary | ICD-10-CM | POA: Diagnosis not present

## 2021-04-02 DIAGNOSIS — U071 COVID-19: Secondary | ICD-10-CM | POA: Diagnosis not present

## 2021-04-02 DIAGNOSIS — M10072 Idiopathic gout, left ankle and foot: Secondary | ICD-10-CM | POA: Diagnosis not present

## 2021-04-02 DIAGNOSIS — R519 Headache, unspecified: Secondary | ICD-10-CM | POA: Diagnosis not present

## 2021-04-02 DIAGNOSIS — N183 Chronic kidney disease, stage 3 unspecified: Secondary | ICD-10-CM | POA: Diagnosis not present

## 2021-04-02 DIAGNOSIS — I4891 Unspecified atrial fibrillation: Secondary | ICD-10-CM | POA: Diagnosis not present

## 2021-04-02 DIAGNOSIS — R8271 Bacteriuria: Secondary | ICD-10-CM | POA: Diagnosis not present

## 2021-04-03 DIAGNOSIS — R8271 Bacteriuria: Secondary | ICD-10-CM | POA: Diagnosis not present

## 2021-04-03 DIAGNOSIS — D649 Anemia, unspecified: Secondary | ICD-10-CM | POA: Diagnosis not present

## 2021-04-03 DIAGNOSIS — R519 Headache, unspecified: Secondary | ICD-10-CM | POA: Diagnosis not present

## 2021-04-03 DIAGNOSIS — R627 Adult failure to thrive: Secondary | ICD-10-CM | POA: Diagnosis not present

## 2021-04-03 DIAGNOSIS — I4891 Unspecified atrial fibrillation: Secondary | ICD-10-CM | POA: Diagnosis not present

## 2021-04-03 DIAGNOSIS — Z7952 Long term (current) use of systemic steroids: Secondary | ICD-10-CM | POA: Diagnosis not present

## 2021-04-03 DIAGNOSIS — I2510 Atherosclerotic heart disease of native coronary artery without angina pectoris: Secondary | ICD-10-CM | POA: Diagnosis not present

## 2021-04-03 DIAGNOSIS — M1A09X1 Idiopathic chronic gout, multiple sites, with tophus (tophi): Secondary | ICD-10-CM | POA: Diagnosis not present

## 2021-04-03 DIAGNOSIS — M25672 Stiffness of left ankle, not elsewhere classified: Secondary | ICD-10-CM | POA: Diagnosis not present

## 2021-04-03 DIAGNOSIS — U071 COVID-19: Secondary | ICD-10-CM | POA: Diagnosis not present

## 2021-04-03 DIAGNOSIS — R5381 Other malaise: Secondary | ICD-10-CM | POA: Diagnosis not present

## 2021-04-03 DIAGNOSIS — N183 Chronic kidney disease, stage 3 unspecified: Secondary | ICD-10-CM | POA: Diagnosis not present

## 2021-04-03 DIAGNOSIS — M109 Gout, unspecified: Secondary | ICD-10-CM | POA: Diagnosis not present

## 2021-04-04 DIAGNOSIS — R8271 Bacteriuria: Secondary | ICD-10-CM | POA: Diagnosis not present

## 2021-04-04 DIAGNOSIS — Z7952 Long term (current) use of systemic steroids: Secondary | ICD-10-CM | POA: Diagnosis not present

## 2021-04-04 DIAGNOSIS — I2510 Atherosclerotic heart disease of native coronary artery without angina pectoris: Secondary | ICD-10-CM | POA: Diagnosis not present

## 2021-04-04 DIAGNOSIS — R519 Headache, unspecified: Secondary | ICD-10-CM | POA: Diagnosis not present

## 2021-04-04 DIAGNOSIS — N183 Chronic kidney disease, stage 3 unspecified: Secondary | ICD-10-CM | POA: Diagnosis not present

## 2021-04-04 DIAGNOSIS — M25672 Stiffness of left ankle, not elsewhere classified: Secondary | ICD-10-CM | POA: Diagnosis not present

## 2021-04-04 DIAGNOSIS — D649 Anemia, unspecified: Secondary | ICD-10-CM | POA: Diagnosis not present

## 2021-04-04 DIAGNOSIS — R5381 Other malaise: Secondary | ICD-10-CM | POA: Diagnosis not present

## 2021-04-04 DIAGNOSIS — I4891 Unspecified atrial fibrillation: Secondary | ICD-10-CM | POA: Diagnosis not present

## 2021-04-04 DIAGNOSIS — M1A09X1 Idiopathic chronic gout, multiple sites, with tophus (tophi): Secondary | ICD-10-CM | POA: Diagnosis not present

## 2021-04-04 DIAGNOSIS — M109 Gout, unspecified: Secondary | ICD-10-CM | POA: Diagnosis not present

## 2021-04-04 DIAGNOSIS — R627 Adult failure to thrive: Secondary | ICD-10-CM | POA: Diagnosis not present

## 2021-04-04 DIAGNOSIS — U071 COVID-19: Secondary | ICD-10-CM | POA: Diagnosis not present

## 2021-04-05 DIAGNOSIS — N183 Chronic kidney disease, stage 3 unspecified: Secondary | ICD-10-CM | POA: Diagnosis not present

## 2021-04-05 DIAGNOSIS — R8271 Bacteriuria: Secondary | ICD-10-CM | POA: Diagnosis not present

## 2021-04-05 DIAGNOSIS — R519 Headache, unspecified: Secondary | ICD-10-CM | POA: Diagnosis not present

## 2021-04-05 DIAGNOSIS — D649 Anemia, unspecified: Secondary | ICD-10-CM | POA: Diagnosis not present

## 2021-04-05 DIAGNOSIS — M109 Gout, unspecified: Secondary | ICD-10-CM | POA: Diagnosis not present

## 2021-04-05 DIAGNOSIS — I4891 Unspecified atrial fibrillation: Secondary | ICD-10-CM | POA: Diagnosis not present

## 2021-04-05 DIAGNOSIS — I2510 Atherosclerotic heart disease of native coronary artery without angina pectoris: Secondary | ICD-10-CM | POA: Diagnosis not present

## 2021-04-05 DIAGNOSIS — U071 COVID-19: Secondary | ICD-10-CM | POA: Diagnosis not present

## 2021-04-05 DIAGNOSIS — R627 Adult failure to thrive: Secondary | ICD-10-CM | POA: Diagnosis not present

## 2021-04-06 DIAGNOSIS — R519 Headache, unspecified: Secondary | ICD-10-CM | POA: Diagnosis not present

## 2021-04-06 DIAGNOSIS — R627 Adult failure to thrive: Secondary | ICD-10-CM | POA: Diagnosis not present

## 2021-04-06 DIAGNOSIS — I2510 Atherosclerotic heart disease of native coronary artery without angina pectoris: Secondary | ICD-10-CM | POA: Diagnosis not present

## 2021-04-06 DIAGNOSIS — N183 Chronic kidney disease, stage 3 unspecified: Secondary | ICD-10-CM | POA: Diagnosis not present

## 2021-04-06 DIAGNOSIS — I4891 Unspecified atrial fibrillation: Secondary | ICD-10-CM | POA: Diagnosis not present

## 2021-04-06 DIAGNOSIS — M109 Gout, unspecified: Secondary | ICD-10-CM | POA: Diagnosis not present

## 2021-04-06 DIAGNOSIS — D649 Anemia, unspecified: Secondary | ICD-10-CM | POA: Diagnosis not present

## 2021-04-06 DIAGNOSIS — U071 COVID-19: Secondary | ICD-10-CM | POA: Diagnosis not present

## 2021-04-06 DIAGNOSIS — R8271 Bacteriuria: Secondary | ICD-10-CM | POA: Diagnosis not present

## 2021-04-07 DIAGNOSIS — N183 Chronic kidney disease, stage 3 unspecified: Secondary | ICD-10-CM | POA: Diagnosis not present

## 2021-04-07 DIAGNOSIS — R8271 Bacteriuria: Secondary | ICD-10-CM | POA: Diagnosis not present

## 2021-04-07 DIAGNOSIS — D649 Anemia, unspecified: Secondary | ICD-10-CM | POA: Diagnosis not present

## 2021-04-07 DIAGNOSIS — I4891 Unspecified atrial fibrillation: Secondary | ICD-10-CM | POA: Diagnosis not present

## 2021-04-07 DIAGNOSIS — I2510 Atherosclerotic heart disease of native coronary artery without angina pectoris: Secondary | ICD-10-CM | POA: Diagnosis not present

## 2021-04-07 DIAGNOSIS — R519 Headache, unspecified: Secondary | ICD-10-CM | POA: Diagnosis not present

## 2021-04-07 DIAGNOSIS — R627 Adult failure to thrive: Secondary | ICD-10-CM | POA: Diagnosis not present

## 2021-04-07 DIAGNOSIS — U071 COVID-19: Secondary | ICD-10-CM | POA: Diagnosis not present

## 2021-04-07 DIAGNOSIS — M109 Gout, unspecified: Secondary | ICD-10-CM | POA: Diagnosis not present

## 2021-04-09 DIAGNOSIS — G8929 Other chronic pain: Secondary | ICD-10-CM | POA: Diagnosis not present

## 2021-04-09 DIAGNOSIS — Z6825 Body mass index (BMI) 25.0-25.9, adult: Secondary | ICD-10-CM | POA: Diagnosis not present

## 2021-04-09 DIAGNOSIS — R519 Headache, unspecified: Secondary | ICD-10-CM | POA: Diagnosis not present

## 2021-04-10 ENCOUNTER — Encounter: Payer: Self-pay | Admitting: *Deleted

## 2021-04-14 ENCOUNTER — Ambulatory Visit: Payer: PPO | Admitting: Diagnostic Neuroimaging

## 2021-04-14 ENCOUNTER — Encounter: Payer: Self-pay | Admitting: Diagnostic Neuroimaging

## 2021-04-14 VITALS — BP 114/76 | HR 83 | Ht 71.0 in | Wt 170.0 lb

## 2021-04-14 DIAGNOSIS — M316 Other giant cell arteritis: Secondary | ICD-10-CM | POA: Diagnosis not present

## 2021-04-14 DIAGNOSIS — R519 Headache, unspecified: Secondary | ICD-10-CM | POA: Diagnosis not present
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
## 2021-04-14 NOTE — Patient Instructions (Addendum)
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 Rogers Blocker / Dr. Carlene Coria) - refer for temporal artery biopsy   BLACK OUT / SYNCOPE (04/14/21) - recommend urgent cardiology follow up; no signs for seizure

## 2021-04-15 ENCOUNTER — Telehealth: Payer: Self-pay | Admitting: Diagnostic Neuroimaging

## 2021-04-15 ENCOUNTER — Telehealth: Payer: Self-pay | Admitting: *Deleted

## 2021-04-15 DIAGNOSIS — I4510 Unspecified right bundle-branch block: Secondary | ICD-10-CM | POA: Diagnosis not present

## 2021-04-15 DIAGNOSIS — N390 Urinary tract infection, site not specified: Secondary | ICD-10-CM | POA: Diagnosis not present

## 2021-04-15 DIAGNOSIS — R531 Weakness: Secondary | ICD-10-CM | POA: Diagnosis not present

## 2021-04-15 DIAGNOSIS — R0602 Shortness of breath: Secondary | ICD-10-CM | POA: Diagnosis not present

## 2021-04-15 LAB — HEMOGLOBIN A1C
Est. average glucose Bld gHb Est-mCnc: 128 mg/dL
Hgb A1c MFr Bld: 6.1 % — ABNORMAL HIGH (ref 4.8–5.6)

## 2021-04-15 LAB — TSH: TSH: 1.54 u[IU]/mL (ref 0.450–4.500)

## 2021-04-15 LAB — ALDOLASE: Aldolase: 4.1 U/L (ref 3.3–10.3)

## 2021-04-15 LAB — HIGH SENSITIVITY CRP: CRP, High Sensitivity: 40.95 mg/L — ABNORMAL HIGH (ref 0.00–3.00)

## 2021-04-15 LAB — CK: Total CK: 43 U/L (ref 41–331)

## 2021-04-15 LAB — VITAMIN B12: Vitamin B-12: 454 pg/mL (ref 232–1245)

## 2021-04-15 LAB — SEDIMENTATION RATE: Sed Rate: 37 mm/hr — ABNORMAL HIGH (ref 0–30)

## 2021-04-15 NOTE — Telephone Encounter (Signed)
Called son, Rocky Crafts who stated father was diagnosed with UTI, given IV antibiotics and put on antibiotics. He is on way home from hospital. I informed him Dr Leta Baptist sent message to rheumatology this morning to address CRP lab and consider increasing prednisone. I advised son I'll call rheumatology, then call him back. [name] verbalized understanding, appreciation. [facility], spoke with Delilah who will send message with high priority to RN requesting she address Dr Gladstone Lighter message with MD.

## 2021-04-15 NOTE — Telephone Encounter (Signed)
Called patient, male answered the phone. She stated he is at [facility] right now.

## 2021-04-15 NOTE — Telephone Encounter (Signed)
Pt's son called wanting to know the update on the decision on increasing his steroids.  Please advise.

## 2021-04-16 NOTE — Telephone Encounter (Signed)
Called son, Wylan LVM advising I have not heard back from rheumatology, asked if he or his father have. Advised him I noted patient has FU with rheumatology on 04/21/21.   Advised if I don't hear from them by this afternoon I'll call again.  Requested a call back.

## 2021-04-16 NOTE — Telephone Encounter (Signed)
I spoke with Dr. Carlene Coria.  Patient was in emergency room and diagnosed with UTI.  Patient is starting to feel better.  Discussed diagnostic challenges for patient as he has been on prednisone for a while and temporal artery biopsy may be negative.  They will monitor patient for now and may consider increasing prednisone. -VRP

## 2021-04-16 NOTE — Telephone Encounter (Signed)
[facility]) Returned phone call regarding CRP on 6/6. My direct number is [phone]

## 2021-04-16 NOTE — Telephone Encounter (Signed)
De Burrs who stated MD's message stated she doesn't adjust prednisone for elevated CRP; patient is in ED , will wait for ED work up.  I informed Helene Kelp of son's information re: ED work up for UTI. Advised her of R Penumalli's  diagnosis of suspected temporal arteritis. Patient has FU with Dr Carlene Coria on 04/21/21; Helene Kelp will give information to MD and let us know her reply.

## 2021-04-21 ENCOUNTER — Encounter: Payer: Self-pay | Admitting: Surgery

## 2021-04-21 ENCOUNTER — Ambulatory Visit: Payer: PPO | Admitting: Surgery

## 2021-04-21 ENCOUNTER — Other Ambulatory Visit: Payer: Self-pay

## 2021-04-21 VITALS — BP 110/71 | HR 65 | Temp 97.7°F | Resp 20 | Ht 71.0 in | Wt 170.0 lb

## 2021-04-21 DIAGNOSIS — R208 Other disturbances of skin sensation: Secondary | ICD-10-CM | POA: Diagnosis not present

## 2021-04-21 DIAGNOSIS — M316 Other giant cell arteritis: Secondary | ICD-10-CM | POA: Diagnosis not present

## 2021-04-21 DIAGNOSIS — R519 Headache, unspecified: Secondary | ICD-10-CM | POA: Diagnosis not present

## 2021-04-21 DIAGNOSIS — Z79899 Other long term (current) drug therapy: Secondary | ICD-10-CM | POA: Diagnosis not present

## 2021-04-21 DIAGNOSIS — M109 Gout, unspecified: Secondary | ICD-10-CM | POA: Diagnosis not present
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
## 2021-04-23 ENCOUNTER — Encounter (HOSPITAL_COMMUNITY): Payer: Self-pay | Admitting: Surgery

## 2021-04-23 ENCOUNTER — Telehealth: Payer: Self-pay

## 2021-04-23 NOTE — Anesthesia Preprocedure Evaluation (Addendum)
Anesthesia Evaluation  Patient identified by MRN, date of birth, ID band Patient awake    Reviewed: Allergy & Precautions, NPO status , Patient's Chart, lab work & pertinent test results, reviewed documented beta blocker date and time   History of Anesthesia Complications Negative for: history of anesthetic complications  Airway Mallampati: III  TM Distance: >3 FB Neck ROM: Full    Dental  (+) Dental Advisory Given, Edentulous Upper   Pulmonary neg pulmonary ROS,    Pulmonary exam normal        Cardiovascular hypertension, Pt. on home beta blockers and Pt. on medications pulmonary hypertension+ CAD, + Cardiac Stents, + Peripheral Vascular Disease and +CHF  + dysrhythmias Atrial Fibrillation  Rhythm:Regular Rate:Bradycardia   '22 TTE - Global hypokinesis--more pronounced in the inferior wall.  LV ejection fraction = 45-50%.  Trace AI, mild MR and TR.  Estimated right ventricular systolic pressure is 45 mmHg.  Mild pulmonary hypertension.      Neuro/Psych  Headaches, PSYCHIATRIC DISORDERS Anxiety    GI/Hepatic Neg liver ROS, hiatal hernia, GERD  Controlled and Medicated,  Endo/Other  negative endocrine ROS  Renal/GU CRFRenal disease     Musculoskeletal  (+) Arthritis ,   Abdominal   Peds  Hematology  On eliquis (last Monday) and plavix (today)     Anesthesia Other Findings Covid+ 03/31/21 See PAT note   Reproductive/Obstetrics                           Anesthesia Physical Anesthesia Plan  ASA: 3  Anesthesia Plan: MAC   Post-op Pain Management:    Induction: Intravenous  PONV Risk Score and Plan: 1 and Propofol infusion and Treatment may vary due to age or medical condition  Airway Management Planned: Natural Airway and Simple Face Mask  Additional Equipment: None  Intra-op Plan:   Post-operative Plan:   Informed Consent: I have reviewed the patients History and  Physical, chart, labs and discussed the procedure including the risks, benefits and alternatives for the proposed anesthesia with the patient or authorized representative who has indicated his/her understanding and acceptance.       Plan Discussed with: CRNA and Anesthesiologist  Anesthesia Plan Comments:       Anesthesia Quick Evaluation

## 2021-04-23 NOTE — Progress Notes (Signed)
DUE TO COVID-19 ONLY ONE VISITOR IS ALLOWED TO COME WITH YOU AND STAY IN THE WAITING ROOM ONLY DURING PRE OP AND PROCEDURE DAY OF SURGERY.   PCP - Dr Jenean Lindau Cardiologist - Dr Abran Richard, [place] Neurology - Dr Andrey Spearman Rheumatology - Dr Christinia Gully, [facility] CE  Chest x-ray - 04/15/21 CE EKG - [facility] 04/15/21 - tracing requested Stress Test - 02/2017 ECHO - 01/30/21 CE  Cardiac Cath - 10/18/20  ICD Pacemaker/Loop - n/a  Sleep Study -  n/a CPAP - none  Blood Thinner Instructions:  Last dose of eliquis was on 04/21/21.   Continue plavix per MD's orders.    Anesthesia review: Yes  STOP now taking any Aspirin (unless otherwise instructed by your surgeon), Aleve, Naproxen, Ibuprofen, Motrin, Advil, Goody's, BC's, all herbal medications, fish oil, and all vitamins.   Coronavirus Screening Covid test - ambulatory surgery  Left a detailed message on Son- [name]'s  [phone]) machine with instructions for DOS.  Patient nor patient's wife were available.  In Epic, it states that son Rocky Crafts handles all the patient's medical information.
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
## 2021-04-23 NOTE — Telephone Encounter (Signed)
Fax received from Dr. Sudie Grumbling office advising pt is at low cardiac risk for planned Left temporal artery biopsy with Dr. Trula Slade on 04/24/21.

## 2021-04-24 ENCOUNTER — Ambulatory Visit (HOSPITAL_COMMUNITY): Payer: PPO | Admitting: Vascular Surgery

## 2021-04-24 ENCOUNTER — Encounter (HOSPITAL_COMMUNITY)
Admission: RE | Disposition: A | Discharge status: Home / Self Care | Payer: Self-pay | Source: Home / Self Care | Attending: Surgery

## 2021-04-24 ENCOUNTER — Encounter (HOSPITAL_COMMUNITY): Payer: Self-pay | Admitting: Surgery

## 2021-04-24 ENCOUNTER — Ambulatory Visit (HOSPITAL_COMMUNITY)
Admission: RE | Admit: 2021-04-24 | Discharge: 2021-04-24 | Disposition: A | Discharge status: Home / Self Care | Payer: PPO | Attending: Surgery | Admitting: Surgery

## 2021-04-24 DIAGNOSIS — I129 Hypertensive chronic kidney disease with stage 1 through stage 4 chronic kidney disease, or unspecified chronic kidney disease: Secondary | ICD-10-CM | POA: Diagnosis not present

## 2021-04-24 DIAGNOSIS — Z8616 Personal history of COVID-19: Secondary | ICD-10-CM | POA: Insufficient documentation

## 2021-04-24 DIAGNOSIS — I739 Peripheral vascular disease, unspecified: Secondary | ICD-10-CM | POA: Insufficient documentation

## 2021-04-24 DIAGNOSIS — Z955 Presence of coronary angioplasty implant and graft: Secondary | ICD-10-CM | POA: Diagnosis not present

## 2021-04-24 DIAGNOSIS — Z7901 Long term (current) use of anticoagulants: Secondary | ICD-10-CM | POA: Diagnosis not present

## 2021-04-24 DIAGNOSIS — I272 Pulmonary hypertension, unspecified: Secondary | ICD-10-CM | POA: Insufficient documentation

## 2021-04-24 DIAGNOSIS — E785 Hyperlipidemia, unspecified: Secondary | ICD-10-CM | POA: Insufficient documentation

## 2021-04-24 DIAGNOSIS — M316 Other giant cell arteritis: Secondary | ICD-10-CM | POA: Diagnosis not present

## 2021-04-24 DIAGNOSIS — I252 Old myocardial infarction: Secondary | ICD-10-CM | POA: Diagnosis not present

## 2021-04-24 DIAGNOSIS — R519 Headache, unspecified: Secondary | ICD-10-CM | POA: Insufficient documentation

## 2021-04-24 DIAGNOSIS — F411 Generalized anxiety disorder: Secondary | ICD-10-CM | POA: Diagnosis not present

## 2021-04-24 DIAGNOSIS — M48061 Spinal stenosis, lumbar region without neurogenic claudication: Secondary | ICD-10-CM | POA: Diagnosis not present

## 2021-04-24 DIAGNOSIS — Z79899 Other long term (current) drug therapy: Secondary | ICD-10-CM | POA: Insufficient documentation

## 2021-04-24 DIAGNOSIS — I2510 Atherosclerotic heart disease of native coronary artery without angina pectoris: Secondary | ICD-10-CM | POA: Diagnosis not present

## 2021-04-24 DIAGNOSIS — I4891 Unspecified atrial fibrillation: Secondary | ICD-10-CM | POA: Diagnosis not present

## 2021-04-24 DIAGNOSIS — Z7952 Long term (current) use of systemic steroids: Secondary | ICD-10-CM | POA: Diagnosis not present

## 2021-04-24 DIAGNOSIS — N189 Chronic kidney disease, unspecified: Secondary | ICD-10-CM | POA: Insufficient documentation

## 2021-04-24 HISTORY — DX: Chronic kidney disease, unspecified: N18.9

## 2021-04-24 HISTORY — PX: ARTERY BIOPSY: SHX891

## 2021-04-24 LAB — POCT I-STAT, CHEM 8
BUN: 36 mg/dL — ABNORMAL HIGH (ref 8–23)
Calcium, Ion: 1.11 mmol/L — ABNORMAL LOW (ref 1.15–1.40)
Chloride: 102 mmol/L (ref 98–111)
Creatinine, Ser: 1.8 mg/dL — ABNORMAL HIGH (ref 0.61–1.24)
Glucose, Bld: 125 mg/dL — ABNORMAL HIGH (ref 70–99)
HCT: 31 % — ABNORMAL LOW (ref 39.0–52.0)
Hemoglobin: 10.5 g/dL — ABNORMAL LOW (ref 13.0–17.0)
Potassium: 4 mmol/L (ref 3.5–5.1)
Sodium: 136 mmol/L (ref 135–145)
TCO2: 24 mmol/L (ref 22–32)

## 2021-04-24 SURGERY — BIOPSY TEMPORAL ARTERY
Anesthesia: Monitor Anesthesia Care | Laterality: Left

## 2021-04-24 MED ORDER — PROPOFOL 10 MG/ML IV BOLUS
INTRAVENOUS | Status: AC
  Filled 2021-04-24: qty 20

## 2021-04-24 MED ORDER — FENTANYL CITRATE (PF) 250 MCG/5ML IJ SOLN
INTRAMUSCULAR | Status: DC | PRN
  Administered 2021-04-24: 50 ug via INTRAVENOUS

## 2021-04-24 MED ORDER — FENTANYL CITRATE (PF) 100 MCG/2ML IJ SOLN: 25.0000 ug | INTRAMUSCULAR | Status: DC | PRN

## 2021-04-24 MED ORDER — LACTATED RINGERS IV SOLN: INTRAVENOUS | Status: DC

## 2021-04-24 MED ORDER — 0.9 % SODIUM CHLORIDE (POUR BTL) OPTIME
TOPICAL | Status: DC | PRN
  Administered 2021-04-24: 1000 mL

## 2021-04-24 MED ORDER — ONDANSETRON HCL 4 MG/2ML IJ SOLN: 4.0000 mg | Freq: Once | INTRAMUSCULAR | Status: DC | PRN

## 2021-04-24 MED ORDER — PROPOFOL 500 MG/50ML IV EMUL
INTRAVENOUS | Status: DC | PRN
  Administered 2021-04-24: 100 ug/kg/min via INTRAVENOUS

## 2021-04-24 MED ORDER — EPHEDRINE SULFATE-NACL 50-0.9 MG/10ML-% IV SOSY
PREFILLED_SYRINGE | INTRAVENOUS | Status: DC | PRN
  Administered 2021-04-24: 10 mg via INTRAVENOUS

## 2021-04-24 MED ORDER — CHLORHEXIDINE GLUCONATE 4 % EX LIQD: 60.0000 mL | Freq: Once | CUTANEOUS | Status: DC

## 2021-04-24 MED ORDER — CHLORHEXIDINE GLUCONATE 0.12 % MT SOLN: 15.0000 mL | Freq: Once | OROMUCOSAL | Status: DC

## 2021-04-24 MED ORDER — SODIUM CHLORIDE 0.9 % IV SOLN: INTRAVENOUS | Status: DC

## 2021-04-24 MED ORDER — ONDANSETRON HCL 4 MG/2ML IJ SOLN
INTRAMUSCULAR | Status: AC
  Filled 2021-04-24: qty 2

## 2021-04-24 MED ORDER — LIDOCAINE HCL (PF) 1 % IJ SOLN
INTRAMUSCULAR | Status: AC
  Filled 2021-04-24: qty 30

## 2021-04-24 MED ORDER — PHENYLEPHRINE 40 MCG/ML (10ML) SYRINGE FOR IV PUSH (FOR BLOOD PRESSURE SUPPORT)
PREFILLED_SYRINGE | INTRAVENOUS | Status: DC | PRN
  Administered 2021-04-24: 80 ug via INTRAVENOUS

## 2021-04-24 MED ORDER — ORAL CARE MOUTH RINSE: 15.0000 mL | Freq: Once | OROMUCOSAL | Status: DC

## 2021-04-24 MED ORDER — CHLORHEXIDINE GLUCONATE 0.12 % MT SOLN
OROMUCOSAL | Status: AC
  Filled 2021-04-24: qty 15

## 2021-04-24 MED ORDER — ONDANSETRON HCL 4 MG/2ML IJ SOLN
INTRAMUSCULAR | Status: DC | PRN
  Administered 2021-04-24: 4 mg via INTRAVENOUS

## 2021-04-24 MED ORDER — FENTANYL CITRATE (PF) 250 MCG/5ML IJ SOLN
INTRAMUSCULAR | Status: AC
  Filled 2021-04-24: qty 5

## 2021-04-24 MED ORDER — LIDOCAINE HCL (PF) 1 % IJ SOLN
INTRAMUSCULAR | Status: DC | PRN
  Administered 2021-04-24: 2 mL

## 2021-04-24 MED ORDER — CEFAZOLIN SODIUM-DEXTROSE 2-4 GM/100ML-% IV SOLN
2.0000 g | INTRAVENOUS | Status: AC
  Administered 2021-04-24: 2 g via INTRAVENOUS
  Filled 2021-04-24: qty 100

## 2021-04-24 SURGICAL SUPPLY — 42 items
CANISTER SUCT 3000ML PPV (MISCELLANEOUS) ×3 IMPLANT
CLIP VESOCCLUDE SM WIDE 6/CT (CLIP) ×3 IMPLANT
CNTNR URN SCR LID CUP LEK RST (MISCELLANEOUS) ×1 IMPLANT
CONT SPEC 4OZ STRL OR WHT (MISCELLANEOUS) ×2
COTTONBALL LRG STERILE PKG (GAUZE/BANDAGES/DRESSINGS) ×3 IMPLANT
COVER SURGICAL LIGHT HANDLE (MISCELLANEOUS) ×3 IMPLANT
COVER WAND RF STERILE (DRAPES) ×3 IMPLANT
DECANTER SPIKE VIAL GLASS SM (MISCELLANEOUS) ×6 IMPLANT
DERMABOND ADVANCED (GAUZE/BANDAGES/DRESSINGS) ×2
DERMABOND ADVANCED .7 DNX12 (GAUZE/BANDAGES/DRESSINGS) ×1 IMPLANT
DRAPE BRACHIAL (DRAPES) IMPLANT
DRAPE HALF SHEET 40X57 (DRAPES) IMPLANT
DRAPE OPHTHALMIC 77X100 STRL (CUSTOM PROCEDURE TRAY) ×3 IMPLANT
DRAPE SURG 17X23 STRL (DRAPES) ×6 IMPLANT
ELECT REM PT RETURN 9FT ADLT (ELECTROSURGICAL) ×3
ELECTRODE REM PT RTRN 9FT ADLT (ELECTROSURGICAL) ×1 IMPLANT
GLOVE SURG POLYISO LF SZ7.5 (GLOVE) ×3 IMPLANT
GLOVE SURG UNDER POLY LF SZ6.5 (GLOVE) ×3 IMPLANT
GLOVE SURG UNDER POLY LF SZ7.5 (GLOVE) ×3 IMPLANT
GOWN STRL REUS W/ TWL LRG LVL3 (GOWN DISPOSABLE) ×2 IMPLANT
GOWN STRL REUS W/ TWL XL LVL3 (GOWN DISPOSABLE) ×1 IMPLANT
GOWN STRL REUS W/TWL LRG LVL3 (GOWN DISPOSABLE) ×4
GOWN STRL REUS W/TWL XL LVL3 (GOWN DISPOSABLE) ×2
KIT BASIN OR (CUSTOM PROCEDURE TRAY) ×3 IMPLANT
KIT TURNOVER KIT B (KITS) ×3 IMPLANT
LOOP VESSEL MINI RED (MISCELLANEOUS) ×3 IMPLANT
NEEDLE HYPO 25GX1X1/2 BEV (NEEDLE) ×3 IMPLANT
NS IRRIG 1000ML POUR BTL (IV SOLUTION) ×3 IMPLANT
PACK GENERAL/GYN (CUSTOM PROCEDURE TRAY) ×3 IMPLANT
PAD ARMBOARD 7.5X6 YLW CONV (MISCELLANEOUS) ×6 IMPLANT
SPONGE LAP 4X18 RFD (DISPOSABLE) ×3 IMPLANT
SUCTION FRAZIER HANDLE 10FR (MISCELLANEOUS) ×2
SUCTION TUBE FRAZIER 10FR DISP (MISCELLANEOUS) ×1 IMPLANT
SUT PROLENE 6 0 BV (SUTURE) IMPLANT
SUT SILK 3 0 (SUTURE) ×2
SUT SILK 3-0 18XBRD TIE 12 (SUTURE) ×1 IMPLANT
SUT VIC AB 3-0 SH 27 (SUTURE) ×2
SUT VIC AB 3-0 SH 27X BRD (SUTURE) ×1 IMPLANT
SUT VICRYL 4-0 PS2 18IN ABS (SUTURE) ×3 IMPLANT
SYR CONTROL 10ML LL (SYRINGE) ×3 IMPLANT
TOWEL GREEN STERILE (TOWEL DISPOSABLE) ×3 IMPLANT
WATER STERILE IRR 1000ML POUR (IV SOLUTION) ×3 IMPLANT

## 2021-04-24 NOTE — Addendum Note (Signed)
Addendum  created 04/24/21 1316 by Myna Bright, CRNA   Intraprocedure Event edited

## 2021-04-24 NOTE — Interval H&P Note (Signed)
History and Physical Interval Note:  04/24/2021 7:55 AM  Aaron Houston  has presented today for surgery, with the diagnosis of TEMPORAL ARTERITIS.  The various methods of treatment have been discussed with the patient and family. After consideration of risks, benefits and other options for treatment, the patient has consented to  Procedure(s): LEFT TEMPORAL ARTERY BIOPSY (Left) as a surgical intervention.  The patient's history has been reviewed, patient examined, no change in status, stable for surgery.  I have reviewed the patient's chart and labs.  Questions were answered to the patient's satisfaction.     Annamarie Major

## 2021-04-24 NOTE — Anesthesia Postprocedure Evaluation (Addendum)
Anesthesia Post Note  Patient: Aaron Houston  Procedure(s) Performed: LEFT TEMPORAL ARTERY BIOPSY (Left)     Patient location during evaluation: PACU Anesthesia Type: MAC Level of consciousness: awake and alert Pain management: pain level controlled Vital Signs Assessment: post-procedure vital signs reviewed and stable Respiratory status: spontaneous breathing, nonlabored ventilation and respiratory function stable Cardiovascular status: stable, blood pressure returned to baseline and bradycardic Anesthetic complications: no   No notable events documented.  Last Vitals:  Vitals:   04/24/21 0910 04/24/21 0925  BP: 113/76 133/79  Pulse: (!) 48 (!) 50  Resp: (!) 24 12  Temp:    SpO2:  97%    Last Pain:  Vitals:   04/24/21 0925  TempSrc:   PainSc: 0-No pain                 Audry Pili

## 2021-04-24 NOTE — Transfer of Care (Addendum)
Immediate Anesthesia Transfer of Care Note  Patient: Aaron Houston   Procedure(s) Performed: LEFT TEMPORAL ARTERY BIOPSY (Left)  Patient Location: PACU  Anesthesia Type:MAC  Level of Consciousness: awake, alert , oriented and patient cooperative  Airway & Oxygen Therapy: Patient Spontanous Breathing and Patient connected to face mask oxygen  Post-op Assessment: Report given to RN, Post -op Vital signs reviewed and stable and Patient moving all extremities  Post vital signs: Reviewed and stable  Last Vitals:  Vitals Value Taken Time  BP 113/76 04/24/21 0909  Temp    Pulse 48 04/24/21 0910  Resp 19 04/24/21 0910  SpO2 100 % 04/24/21 0910  Vitals shown include unvalidated device data.  Last Pain:  Vitals:   04/24/21 3524  TempSrc:   PainSc: 0-No pain      Patients Stated Pain Goal: 2 (81/85/90 9311)  Complications: No notable events documented.

## 2021-04-24 NOTE — Op Note (Signed)
    Patient name: Aaron Houston MRN: [id number] DOB: March 01, 1946 Sex: male  04/24/2021 Pre-operative Diagnosis: Possible temporal arteriti Post-operative diagnosis:  Same Surgeon:  Annamarie Major Assistants: None Procedure:   Left temporal artery biopsy Anesthesia: MAC Blood Loss: Minimal Specimens: Approximately 4 cm of temporal artery were sent to pathology   Indications: This is a 75 year old gentleman with recurrent headaches and concern for temporal arteritis.  He comes in today for temporal artery biopsy.  Procedure:  The patient was identified in the holding area and taken to [place] 12  The patient was then placed supine on the table. MAC anesthesia was administered.  The patient was prepped and draped in the usual sterile fashion.  A time out was called and antibiotics were administered.  Hand-held Doppler was used to evaluate the location of the left temporal artery.  Incision was made with a 15 blade.  Cautery was used divide the subcutaneous tissue.  I then identified the temporal artery.  This was dissected out throughout the length of the incision and ligated proximally and distally with silk ties.  It measured approximately 4 cm in length.  There was no gross inflammatory reaction around the artery.  The wound was then irrigated.  Hemostasis was achieved.  The deep tissue was reapproximated with 3-0 Vicryl and the skin was closed with 4-0 Vicryl followed by Dermabond.   Disposition: To PACU stable.   Theotis Burrow, M.D., [facility] V[facility] and Vein Specialists of [place] Office: [phone] Pager:  [phone]

## 2021-04-24 NOTE — Discharge Instructions (Signed)
Office will call for follow up  Call MD with bleeding or swelling from the incision

## 2021-04-25 ENCOUNTER — Encounter (HOSPITAL_COMMUNITY): Payer: Self-pay | Admitting: Surgery

## 2021-04-25 LAB — SURGICAL PATHOLOGY

## 2021-04-26 DIAGNOSIS — I4891 Unspecified atrial fibrillation: Secondary | ICD-10-CM | POA: Diagnosis not present

## 2021-04-26 DIAGNOSIS — H905 Unspecified sensorineural hearing loss: Secondary | ICD-10-CM | POA: Diagnosis not present

## 2021-04-26 DIAGNOSIS — Z781 Physical restraint status: Secondary | ICD-10-CM | POA: Diagnosis not present

## 2021-04-26 DIAGNOSIS — M542 Cervicalgia: Secondary | ICD-10-CM | POA: Diagnosis not present

## 2021-04-26 DIAGNOSIS — I252 Old myocardial infarction: Secondary | ICD-10-CM | POA: Diagnosis not present

## 2021-04-26 DIAGNOSIS — R2689 Other abnormalities of gait and mobility: Secondary | ICD-10-CM | POA: Diagnosis not present

## 2021-04-26 DIAGNOSIS — Z7901 Long term (current) use of anticoagulants: Secondary | ICD-10-CM | POA: Diagnosis not present

## 2021-04-26 DIAGNOSIS — M25562 Pain in left knee: Secondary | ICD-10-CM | POA: Diagnosis not present

## 2021-04-26 DIAGNOSIS — N189 Chronic kidney disease, unspecified: Secondary | ICD-10-CM | POA: Diagnosis not present

## 2021-04-26 DIAGNOSIS — R519 Headache, unspecified: Secondary | ICD-10-CM | POA: Diagnosis not present

## 2021-04-26 DIAGNOSIS — R0602 Shortness of breath: Secondary | ICD-10-CM | POA: Diagnosis not present

## 2021-04-26 DIAGNOSIS — R1110 Vomiting, unspecified: Secondary | ICD-10-CM | POA: Diagnosis not present

## 2021-04-26 DIAGNOSIS — R410 Disorientation, unspecified: Secondary | ICD-10-CM | POA: Diagnosis not present

## 2021-04-26 DIAGNOSIS — G932 Benign intracranial hypertension: Secondary | ICD-10-CM | POA: Diagnosis not present

## 2021-04-26 DIAGNOSIS — Z9181 History of falling: Secondary | ICD-10-CM | POA: Diagnosis not present

## 2021-04-26 DIAGNOSIS — R112 Nausea with vomiting, unspecified: Secondary | ICD-10-CM | POA: Diagnosis not present

## 2021-04-26 DIAGNOSIS — Z978 Presence of other specified devices: Secondary | ICD-10-CM | POA: Diagnosis not present

## 2021-04-26 DIAGNOSIS — B451 Cerebral cryptococcosis: Secondary | ICD-10-CM | POA: Diagnosis not present

## 2021-04-26 DIAGNOSIS — N390 Urinary tract infection, site not specified: Secondary | ICD-10-CM | POA: Diagnosis not present

## 2021-04-26 DIAGNOSIS — Z743 Need for continuous supervision: Secondary | ICD-10-CM | POA: Diagnosis not present

## 2021-04-26 DIAGNOSIS — Z4659 Encounter for fitting and adjustment of other gastrointestinal appliance and device: Secondary | ICD-10-CM | POA: Diagnosis not present

## 2021-04-26 DIAGNOSIS — R55 Syncope and collapse: Secondary | ICD-10-CM | POA: Diagnosis not present

## 2021-04-26 DIAGNOSIS — M6281 Muscle weakness (generalized): Secondary | ICD-10-CM | POA: Diagnosis not present

## 2021-04-26 DIAGNOSIS — G44059 Short lasting unilateral neuralgiform headache with conjunctival injection and tearing (SUNCT), not intractable: Secondary | ICD-10-CM | POA: Diagnosis not present

## 2021-04-26 DIAGNOSIS — Z79899 Other long term (current) drug therapy: Secondary | ICD-10-CM | POA: Diagnosis not present

## 2021-04-26 DIAGNOSIS — M1712 Unilateral primary osteoarthritis, left knee: Secondary | ICD-10-CM | POA: Diagnosis not present

## 2021-04-26 DIAGNOSIS — Z5181 Encounter for therapeutic drug level monitoring: Secondary | ICD-10-CM | POA: Diagnosis not present

## 2021-04-26 DIAGNOSIS — H903 Sensorineural hearing loss, bilateral: Secondary | ICD-10-CM | POA: Diagnosis not present

## 2021-04-26 DIAGNOSIS — J690 Pneumonitis due to inhalation of food and vomit: Secondary | ICD-10-CM | POA: Diagnosis not present

## 2021-04-26 DIAGNOSIS — E43 Unspecified severe protein-calorie malnutrition: Secondary | ICD-10-CM | POA: Diagnosis not present

## 2021-04-26 DIAGNOSIS — E46 Unspecified protein-calorie malnutrition: Secondary | ICD-10-CM | POA: Diagnosis not present

## 2021-04-26 DIAGNOSIS — M810 Age-related osteoporosis without current pathological fracture: Secondary | ICD-10-CM | POA: Diagnosis not present

## 2021-04-26 DIAGNOSIS — M109 Gout, unspecified: Secondary | ICD-10-CM | POA: Diagnosis not present

## 2021-04-26 DIAGNOSIS — M10062 Idiopathic gout, left knee: Secondary | ICD-10-CM | POA: Diagnosis not present

## 2021-04-26 DIAGNOSIS — Z1632 Resistance to antifungal drug(s): Secondary | ICD-10-CM | POA: Diagnosis not present

## 2021-04-26 DIAGNOSIS — E872 Acidosis: Secondary | ICD-10-CM | POA: Diagnosis not present

## 2021-04-26 DIAGNOSIS — H538 Other visual disturbances: Secondary | ICD-10-CM | POA: Diagnosis not present

## 2021-04-26 DIAGNOSIS — T380X5A Adverse effect of glucocorticoids and synthetic analogues, initial encounter: Secondary | ICD-10-CM | POA: Diagnosis not present

## 2021-04-26 DIAGNOSIS — Z8616 Personal history of COVID-19: Secondary | ICD-10-CM | POA: Diagnosis not present

## 2021-04-26 DIAGNOSIS — E876 Hypokalemia: Secondary | ICD-10-CM | POA: Diagnosis not present

## 2021-04-26 DIAGNOSIS — R41841 Cognitive communication deficit: Secondary | ICD-10-CM | POA: Diagnosis not present

## 2021-04-26 DIAGNOSIS — K5900 Constipation, unspecified: Secondary | ICD-10-CM | POA: Diagnosis not present

## 2021-04-26 DIAGNOSIS — M549 Dorsalgia, unspecified: Secondary | ICD-10-CM | POA: Diagnosis not present

## 2021-04-26 DIAGNOSIS — G928 Other toxic encephalopathy: Secondary | ICD-10-CM | POA: Diagnosis not present

## 2021-04-26 DIAGNOSIS — G0490 Encephalitis and encephalomyelitis, unspecified: Secondary | ICD-10-CM | POA: Diagnosis not present

## 2021-04-26 DIAGNOSIS — I4581 Long QT syndrome: Secondary | ICD-10-CM | POA: Diagnosis not present

## 2021-04-26 DIAGNOSIS — B457 Disseminated cryptococcosis: Secondary | ICD-10-CM | POA: Diagnosis not present

## 2021-04-26 DIAGNOSIS — I129 Hypertensive chronic kidney disease with stage 1 through stage 4 chronic kidney disease, or unspecified chronic kidney disease: Secondary | ICD-10-CM | POA: Diagnosis not present

## 2021-04-26 DIAGNOSIS — I2510 Atherosclerotic heart disease of native coronary artery without angina pectoris: Secondary | ICD-10-CM | POA: Diagnosis not present

## 2021-04-26 DIAGNOSIS — D849 Immunodeficiency, unspecified: Secondary | ICD-10-CM | POA: Diagnosis not present

## 2021-04-26 DIAGNOSIS — G4452 New daily persistent headache (NDPH): Secondary | ICD-10-CM | POA: Diagnosis not present

## 2021-04-26 DIAGNOSIS — B459 Cryptococcosis, unspecified: Secondary | ICD-10-CM | POA: Diagnosis not present

## 2021-04-26 DIAGNOSIS — I491 Atrial premature depolarization: Secondary | ICD-10-CM | POA: Diagnosis not present

## 2021-04-26 DIAGNOSIS — Z789 Other specified health status: Secondary | ICD-10-CM | POA: Diagnosis not present

## 2021-04-26 DIAGNOSIS — E42 Marasmic kwashiorkor: Secondary | ICD-10-CM | POA: Diagnosis not present

## 2021-04-26 DIAGNOSIS — G4700 Insomnia, unspecified: Secondary | ICD-10-CM | POA: Diagnosis not present

## 2021-04-26 DIAGNOSIS — N1832 Chronic kidney disease, stage 3b: Secondary | ICD-10-CM | POA: Diagnosis not present

## 2021-04-26 DIAGNOSIS — I10 Essential (primary) hypertension: Secondary | ICD-10-CM | POA: Diagnosis not present

## 2021-04-26 DIAGNOSIS — R1310 Dysphagia, unspecified: Secondary | ICD-10-CM | POA: Diagnosis not present

## 2021-04-26 DIAGNOSIS — R627 Adult failure to thrive: Secondary | ICD-10-CM | POA: Diagnosis not present

## 2021-04-26 DIAGNOSIS — G9341 Metabolic encephalopathy: Secondary | ICD-10-CM | POA: Diagnosis not present

## 2021-04-26 DIAGNOSIS — R7881 Bacteremia: Secondary | ICD-10-CM | POA: Diagnosis not present

## 2021-04-26 DIAGNOSIS — B952 Enterococcus as the cause of diseases classified elsewhere: Secondary | ICD-10-CM | POA: Diagnosis not present

## 2021-04-26 DIAGNOSIS — R9431 Abnormal electrocardiogram [ECG] [EKG]: Secondary | ICD-10-CM | POA: Diagnosis not present

## 2021-04-26 DIAGNOSIS — I480 Paroxysmal atrial fibrillation: Secondary | ICD-10-CM | POA: Diagnosis not present

## 2021-04-26 DIAGNOSIS — B9689 Other specified bacterial agents as the cause of diseases classified elsewhere: Secondary | ICD-10-CM | POA: Diagnosis not present

## 2021-04-26 DIAGNOSIS — D509 Iron deficiency anemia, unspecified: Secondary | ICD-10-CM | POA: Diagnosis not present

## 2021-04-26 DIAGNOSIS — B49 Unspecified mycosis: Secondary | ICD-10-CM | POA: Diagnosis not present

## 2021-04-26 DIAGNOSIS — N400 Enlarged prostate without lower urinary tract symptoms: Secondary | ICD-10-CM | POA: Diagnosis not present

## 2021-04-26 DIAGNOSIS — R110 Nausea: Secondary | ICD-10-CM | POA: Diagnosis not present

## 2021-04-26 DIAGNOSIS — M10061 Idiopathic gout, right knee: Secondary | ICD-10-CM | POA: Diagnosis not present

## 2021-04-26 DIAGNOSIS — R441 Visual hallucinations: Secondary | ICD-10-CM | POA: Diagnosis not present

## 2021-04-26 DIAGNOSIS — G9340 Encephalopathy, unspecified: Secondary | ICD-10-CM | POA: Diagnosis not present

## 2021-04-26 DIAGNOSIS — T426X5A Adverse effect of other antiepileptic and sedative-hypnotic drugs, initial encounter: Secondary | ICD-10-CM | POA: Diagnosis not present

## 2021-04-26 DIAGNOSIS — B961 Klebsiella pneumoniae [K. pneumoniae] as the cause of diseases classified elsewhere: Secondary | ICD-10-CM | POA: Diagnosis not present

## 2021-04-26 DIAGNOSIS — R42 Dizziness and giddiness: Secondary | ICD-10-CM | POA: Diagnosis not present

## 2021-04-26 DIAGNOSIS — G43909 Migraine, unspecified, not intractable, without status migrainosus: Secondary | ICD-10-CM | POA: Diagnosis not present

## 2021-04-26 DIAGNOSIS — N179 Acute kidney failure, unspecified: Secondary | ICD-10-CM | POA: Diagnosis not present

## 2021-04-26 DIAGNOSIS — L270 Generalized skin eruption due to drugs and medicaments taken internally: Secondary | ICD-10-CM | POA: Diagnosis not present

## 2021-04-26 DIAGNOSIS — Z7952 Long term (current) use of systemic steroids: Secondary | ICD-10-CM | POA: Diagnosis not present

## 2021-04-26 DIAGNOSIS — R000 Tachycardia, unspecified: Secondary | ICD-10-CM | POA: Diagnosis not present

## 2021-04-26 DIAGNOSIS — E785 Hyperlipidemia, unspecified: Secondary | ICD-10-CM | POA: Diagnosis not present

## 2021-04-26 DIAGNOSIS — R1031 Right lower quadrant pain: Secondary | ICD-10-CM | POA: Diagnosis not present

## 2021-04-27 DIAGNOSIS — R519 Headache, unspecified: Secondary | ICD-10-CM | POA: Diagnosis not present

## 2021-04-27 DIAGNOSIS — I491 Atrial premature depolarization: Secondary | ICD-10-CM | POA: Diagnosis not present

## 2021-04-27 DIAGNOSIS — I4891 Unspecified atrial fibrillation: Secondary | ICD-10-CM | POA: Diagnosis not present

## 2021-04-27 DIAGNOSIS — R42 Dizziness and giddiness: Secondary | ICD-10-CM | POA: Diagnosis not present

## 2021-04-27 DIAGNOSIS — R000 Tachycardia, unspecified: Secondary | ICD-10-CM | POA: Diagnosis not present

## 2021-04-27 DIAGNOSIS — R9431 Abnormal electrocardiogram [ECG] [EKG]: Secondary | ICD-10-CM | POA: Diagnosis not present

## 2021-04-27 DIAGNOSIS — R55 Syncope and collapse: Secondary | ICD-10-CM | POA: Diagnosis not present

## 2021-04-27 DIAGNOSIS — M542 Cervicalgia: Secondary | ICD-10-CM | POA: Diagnosis not present

## 2021-04-27 DIAGNOSIS — R112 Nausea with vomiting, unspecified: Secondary | ICD-10-CM | POA: Diagnosis not present

## 2021-04-27 DIAGNOSIS — E785 Hyperlipidemia, unspecified: Secondary | ICD-10-CM | POA: Diagnosis not present

## 2021-04-27 DIAGNOSIS — I10 Essential (primary) hypertension: Secondary | ICD-10-CM | POA: Diagnosis not present

## 2021-04-28 ENCOUNTER — Telehealth: Payer: Self-pay | Admitting: *Deleted

## 2021-04-28 DIAGNOSIS — I4891 Unspecified atrial fibrillation: Secondary | ICD-10-CM | POA: Diagnosis not present

## 2021-04-28 DIAGNOSIS — R519 Headache, unspecified: Secondary | ICD-10-CM | POA: Diagnosis not present

## 2021-04-28 DIAGNOSIS — R42 Dizziness and giddiness: Secondary | ICD-10-CM | POA: Diagnosis not present

## 2021-04-28 DIAGNOSIS — R0602 Shortness of breath: Secondary | ICD-10-CM | POA: Diagnosis not present

## 2021-04-28 DIAGNOSIS — I252 Old myocardial infarction: Secondary | ICD-10-CM | POA: Diagnosis not present

## 2021-04-28 DIAGNOSIS — Z9181 History of falling: Secondary | ICD-10-CM | POA: Diagnosis not present

## 2021-04-28 DIAGNOSIS — R55 Syncope and collapse: Secondary | ICD-10-CM | POA: Diagnosis not present

## 2021-04-28 DIAGNOSIS — E785 Hyperlipidemia, unspecified: Secondary | ICD-10-CM | POA: Diagnosis not present

## 2021-04-28 DIAGNOSIS — I10 Essential (primary) hypertension: Secondary | ICD-10-CM | POA: Diagnosis not present

## 2021-04-28 DIAGNOSIS — R112 Nausea with vomiting, unspecified: Secondary | ICD-10-CM | POA: Diagnosis not present

## 2021-04-28 NOTE — Telephone Encounter (Signed)
Per Dr Leta Baptist biopsy results negative for inflammation. Report faxed to Dr Tamera Punt, rheumatology, [facility].

## 2021-04-28 NOTE — Telephone Encounter (Signed)
Temporal artery biopsy results on MD desk for review.

## 2021-04-29 DIAGNOSIS — R519 Headache, unspecified: Secondary | ICD-10-CM | POA: Diagnosis not present

## 2021-04-29 DIAGNOSIS — R55 Syncope and collapse: Secondary | ICD-10-CM | POA: Diagnosis not present

## 2021-04-30 DIAGNOSIS — H538 Other visual disturbances: Secondary | ICD-10-CM | POA: Diagnosis not present

## 2021-04-30 DIAGNOSIS — N179 Acute kidney failure, unspecified: Secondary | ICD-10-CM | POA: Diagnosis not present

## 2021-04-30 DIAGNOSIS — M109 Gout, unspecified: Secondary | ICD-10-CM | POA: Diagnosis not present

## 2021-04-30 DIAGNOSIS — R410 Disorientation, unspecified: Secondary | ICD-10-CM | POA: Diagnosis not present

## 2021-04-30 DIAGNOSIS — R112 Nausea with vomiting, unspecified: Secondary | ICD-10-CM | POA: Diagnosis not present

## 2021-04-30 DIAGNOSIS — I2510 Atherosclerotic heart disease of native coronary artery without angina pectoris: Secondary | ICD-10-CM | POA: Diagnosis not present

## 2021-04-30 DIAGNOSIS — I4891 Unspecified atrial fibrillation: Secondary | ICD-10-CM | POA: Diagnosis not present

## 2021-04-30 DIAGNOSIS — R55 Syncope and collapse: Secondary | ICD-10-CM | POA: Diagnosis not present

## 2021-04-30 DIAGNOSIS — Z7901 Long term (current) use of anticoagulants: Secondary | ICD-10-CM | POA: Diagnosis not present

## 2021-04-30 DIAGNOSIS — R110 Nausea: Secondary | ICD-10-CM | POA: Diagnosis not present

## 2021-04-30 DIAGNOSIS — N189 Chronic kidney disease, unspecified: Secondary | ICD-10-CM | POA: Diagnosis not present

## 2021-04-30 DIAGNOSIS — R519 Headache, unspecified: Secondary | ICD-10-CM | POA: Diagnosis not present

## 2021-04-30 DIAGNOSIS — M549 Dorsalgia, unspecified: Secondary | ICD-10-CM | POA: Diagnosis not present

## 2021-04-30 DIAGNOSIS — R1310 Dysphagia, unspecified: Secondary | ICD-10-CM | POA: Diagnosis not present

## 2021-05-01 DIAGNOSIS — M109 Gout, unspecified: Secondary | ICD-10-CM | POA: Diagnosis not present

## 2021-05-01 DIAGNOSIS — R112 Nausea with vomiting, unspecified: Secondary | ICD-10-CM | POA: Diagnosis not present

## 2021-05-01 DIAGNOSIS — R110 Nausea: Secondary | ICD-10-CM | POA: Diagnosis not present

## 2021-05-01 DIAGNOSIS — N179 Acute kidney failure, unspecified: Secondary | ICD-10-CM | POA: Diagnosis not present

## 2021-05-01 DIAGNOSIS — R55 Syncope and collapse: Secondary | ICD-10-CM | POA: Diagnosis not present

## 2021-05-01 DIAGNOSIS — M549 Dorsalgia, unspecified: Secondary | ICD-10-CM | POA: Diagnosis not present

## 2021-05-01 DIAGNOSIS — H538 Other visual disturbances: Secondary | ICD-10-CM | POA: Diagnosis not present

## 2021-05-01 DIAGNOSIS — R1310 Dysphagia, unspecified: Secondary | ICD-10-CM | POA: Diagnosis not present

## 2021-05-01 DIAGNOSIS — R410 Disorientation, unspecified: Secondary | ICD-10-CM | POA: Diagnosis not present

## 2021-05-01 DIAGNOSIS — R519 Headache, unspecified: Secondary | ICD-10-CM | POA: Diagnosis not present

## 2021-05-01 DIAGNOSIS — N189 Chronic kidney disease, unspecified: Secondary | ICD-10-CM | POA: Diagnosis not present

## 2021-05-02 DIAGNOSIS — M549 Dorsalgia, unspecified: Secondary | ICD-10-CM | POA: Diagnosis not present

## 2021-05-02 DIAGNOSIS — H538 Other visual disturbances: Secondary | ICD-10-CM | POA: Diagnosis not present

## 2021-05-02 DIAGNOSIS — N179 Acute kidney failure, unspecified: Secondary | ICD-10-CM | POA: Diagnosis not present

## 2021-05-02 DIAGNOSIS — R55 Syncope and collapse: Secondary | ICD-10-CM | POA: Diagnosis not present

## 2021-05-02 DIAGNOSIS — N189 Chronic kidney disease, unspecified: Secondary | ICD-10-CM | POA: Diagnosis not present

## 2021-05-02 DIAGNOSIS — R1310 Dysphagia, unspecified: Secondary | ICD-10-CM | POA: Diagnosis not present

## 2021-05-02 DIAGNOSIS — M109 Gout, unspecified: Secondary | ICD-10-CM | POA: Diagnosis not present

## 2021-05-02 DIAGNOSIS — R519 Headache, unspecified: Secondary | ICD-10-CM | POA: Diagnosis not present

## 2021-05-02 DIAGNOSIS — R410 Disorientation, unspecified: Secondary | ICD-10-CM | POA: Diagnosis not present

## 2021-05-02 DIAGNOSIS — R110 Nausea: Secondary | ICD-10-CM | POA: Diagnosis not present

## 2021-05-02 DIAGNOSIS — R112 Nausea with vomiting, unspecified: Secondary | ICD-10-CM | POA: Diagnosis not present

## 2021-05-03 DIAGNOSIS — R1310 Dysphagia, unspecified: Secondary | ICD-10-CM | POA: Diagnosis not present

## 2021-05-03 DIAGNOSIS — R55 Syncope and collapse: Secondary | ICD-10-CM | POA: Diagnosis not present

## 2021-05-03 DIAGNOSIS — M109 Gout, unspecified: Secondary | ICD-10-CM | POA: Diagnosis not present

## 2021-05-03 DIAGNOSIS — R410 Disorientation, unspecified: Secondary | ICD-10-CM | POA: Diagnosis not present

## 2021-05-03 DIAGNOSIS — R112 Nausea with vomiting, unspecified: Secondary | ICD-10-CM | POA: Diagnosis not present

## 2021-05-03 DIAGNOSIS — H538 Other visual disturbances: Secondary | ICD-10-CM | POA: Diagnosis not present

## 2021-05-03 DIAGNOSIS — N179 Acute kidney failure, unspecified: Secondary | ICD-10-CM | POA: Diagnosis not present

## 2021-05-03 DIAGNOSIS — R519 Headache, unspecified: Secondary | ICD-10-CM | POA: Diagnosis not present

## 2021-05-03 DIAGNOSIS — M549 Dorsalgia, unspecified: Secondary | ICD-10-CM | POA: Diagnosis not present

## 2021-05-03 DIAGNOSIS — R110 Nausea: Secondary | ICD-10-CM | POA: Diagnosis not present

## 2021-05-03 DIAGNOSIS — N189 Chronic kidney disease, unspecified: Secondary | ICD-10-CM | POA: Diagnosis not present

## 2021-05-04 DIAGNOSIS — B451 Cerebral cryptococcosis: Secondary | ICD-10-CM | POA: Diagnosis not present

## 2021-05-04 DIAGNOSIS — R55 Syncope and collapse: Secondary | ICD-10-CM | POA: Diagnosis not present

## 2021-05-04 DIAGNOSIS — R1310 Dysphagia, unspecified: Secondary | ICD-10-CM | POA: Diagnosis not present

## 2021-05-05 DIAGNOSIS — R519 Headache, unspecified: Secondary | ICD-10-CM | POA: Diagnosis not present

## 2021-05-05 DIAGNOSIS — R55 Syncope and collapse: Secondary | ICD-10-CM | POA: Diagnosis not present

## 2021-05-05 DIAGNOSIS — R1310 Dysphagia, unspecified: Secondary | ICD-10-CM | POA: Diagnosis not present

## 2021-05-05 DIAGNOSIS — G932 Benign intracranial hypertension: Secondary | ICD-10-CM | POA: Diagnosis not present

## 2021-05-05 DIAGNOSIS — B451 Cerebral cryptococcosis: Secondary | ICD-10-CM | POA: Diagnosis not present

## 2021-05-06 DIAGNOSIS — I252 Old myocardial infarction: Secondary | ICD-10-CM | POA: Diagnosis not present

## 2021-05-06 DIAGNOSIS — G928 Other toxic encephalopathy: Secondary | ICD-10-CM | POA: Diagnosis not present

## 2021-05-06 DIAGNOSIS — G932 Benign intracranial hypertension: Secondary | ICD-10-CM | POA: Diagnosis not present

## 2021-05-06 DIAGNOSIS — I4891 Unspecified atrial fibrillation: Secondary | ICD-10-CM | POA: Diagnosis not present

## 2021-05-06 DIAGNOSIS — R55 Syncope and collapse: Secondary | ICD-10-CM | POA: Diagnosis not present

## 2021-05-06 DIAGNOSIS — I2510 Atherosclerotic heart disease of native coronary artery without angina pectoris: Secondary | ICD-10-CM | POA: Diagnosis not present

## 2021-05-06 DIAGNOSIS — R7881 Bacteremia: Secondary | ICD-10-CM | POA: Diagnosis not present

## 2021-05-06 DIAGNOSIS — B961 Klebsiella pneumoniae [K. pneumoniae] as the cause of diseases classified elsewhere: Secondary | ICD-10-CM | POA: Diagnosis not present

## 2021-05-06 DIAGNOSIS — E785 Hyperlipidemia, unspecified: Secondary | ICD-10-CM | POA: Diagnosis not present

## 2021-05-06 DIAGNOSIS — I10 Essential (primary) hypertension: Secondary | ICD-10-CM | POA: Diagnosis not present

## 2021-05-06 DIAGNOSIS — R1310 Dysphagia, unspecified: Secondary | ICD-10-CM | POA: Diagnosis not present

## 2021-05-06 DIAGNOSIS — B451 Cerebral cryptococcosis: Secondary | ICD-10-CM | POA: Diagnosis not present

## 2021-05-07 DIAGNOSIS — B961 Klebsiella pneumoniae [K. pneumoniae] as the cause of diseases classified elsewhere: Secondary | ICD-10-CM | POA: Diagnosis not present

## 2021-05-07 DIAGNOSIS — G932 Benign intracranial hypertension: Secondary | ICD-10-CM | POA: Diagnosis not present

## 2021-05-07 DIAGNOSIS — I10 Essential (primary) hypertension: Secondary | ICD-10-CM | POA: Diagnosis not present

## 2021-05-07 DIAGNOSIS — B459 Cryptococcosis, unspecified: Secondary | ICD-10-CM | POA: Diagnosis not present

## 2021-05-07 DIAGNOSIS — I2510 Atherosclerotic heart disease of native coronary artery without angina pectoris: Secondary | ICD-10-CM | POA: Diagnosis not present

## 2021-05-07 DIAGNOSIS — I4891 Unspecified atrial fibrillation: Secondary | ICD-10-CM | POA: Diagnosis not present

## 2021-05-07 DIAGNOSIS — R7881 Bacteremia: Secondary | ICD-10-CM | POA: Diagnosis not present

## 2021-05-07 DIAGNOSIS — B451 Cerebral cryptococcosis: Secondary | ICD-10-CM | POA: Diagnosis not present

## 2021-05-07 DIAGNOSIS — Z4659 Encounter for fitting and adjustment of other gastrointestinal appliance and device: Secondary | ICD-10-CM | POA: Diagnosis not present

## 2021-05-07 DIAGNOSIS — G928 Other toxic encephalopathy: Secondary | ICD-10-CM | POA: Diagnosis not present

## 2021-05-07 DIAGNOSIS — R1310 Dysphagia, unspecified: Secondary | ICD-10-CM | POA: Diagnosis not present

## 2021-05-07 DIAGNOSIS — E785 Hyperlipidemia, unspecified: Secondary | ICD-10-CM | POA: Diagnosis not present

## 2021-05-07 DIAGNOSIS — I252 Old myocardial infarction: Secondary | ICD-10-CM | POA: Diagnosis not present

## 2021-05-07 DIAGNOSIS — R55 Syncope and collapse: Secondary | ICD-10-CM | POA: Diagnosis not present

## 2021-05-08 DIAGNOSIS — G932 Benign intracranial hypertension: Secondary | ICD-10-CM | POA: Diagnosis not present

## 2021-05-08 DIAGNOSIS — B961 Klebsiella pneumoniae [K. pneumoniae] as the cause of diseases classified elsewhere: Secondary | ICD-10-CM | POA: Diagnosis not present

## 2021-05-08 DIAGNOSIS — R1310 Dysphagia, unspecified: Secondary | ICD-10-CM | POA: Diagnosis not present

## 2021-05-08 DIAGNOSIS — B459 Cryptococcosis, unspecified: Secondary | ICD-10-CM | POA: Diagnosis not present

## 2021-05-08 DIAGNOSIS — I2510 Atherosclerotic heart disease of native coronary artery without angina pectoris: Secondary | ICD-10-CM | POA: Diagnosis not present

## 2021-05-08 DIAGNOSIS — B451 Cerebral cryptococcosis: Secondary | ICD-10-CM | POA: Diagnosis not present

## 2021-05-08 DIAGNOSIS — I4891 Unspecified atrial fibrillation: Secondary | ICD-10-CM | POA: Diagnosis not present

## 2021-05-08 DIAGNOSIS — I10 Essential (primary) hypertension: Secondary | ICD-10-CM | POA: Diagnosis not present

## 2021-05-08 DIAGNOSIS — E876 Hypokalemia: Secondary | ICD-10-CM | POA: Diagnosis not present

## 2021-05-08 DIAGNOSIS — N390 Urinary tract infection, site not specified: Secondary | ICD-10-CM | POA: Diagnosis not present

## 2021-05-08 DIAGNOSIS — R7881 Bacteremia: Secondary | ICD-10-CM | POA: Diagnosis not present

## 2021-05-08 DIAGNOSIS — B952 Enterococcus as the cause of diseases classified elsewhere: Secondary | ICD-10-CM | POA: Diagnosis not present

## 2021-05-08 DIAGNOSIS — G928 Other toxic encephalopathy: Secondary | ICD-10-CM | POA: Diagnosis not present

## 2021-05-08 DIAGNOSIS — E785 Hyperlipidemia, unspecified: Secondary | ICD-10-CM | POA: Diagnosis not present

## 2021-05-08 DIAGNOSIS — R55 Syncope and collapse: Secondary | ICD-10-CM | POA: Diagnosis not present

## 2021-06-05 DIAGNOSIS — E46 Unspecified protein-calorie malnutrition: Secondary | ICD-10-CM | POA: Diagnosis not present

## 2021-06-05 DIAGNOSIS — R110 Nausea: Secondary | ICD-10-CM | POA: Diagnosis not present

## 2021-06-05 DIAGNOSIS — R262 Difficulty in walking, not elsewhere classified: Secondary | ICD-10-CM | POA: Diagnosis not present

## 2021-06-05 DIAGNOSIS — H903 Sensorineural hearing loss, bilateral: Secondary | ICD-10-CM | POA: Diagnosis not present

## 2021-06-05 DIAGNOSIS — M10062 Idiopathic gout, left knee: Secondary | ICD-10-CM | POA: Diagnosis not present

## 2021-06-05 DIAGNOSIS — G039 Meningitis, unspecified: Secondary | ICD-10-CM | POA: Diagnosis not present

## 2021-06-05 DIAGNOSIS — M25552 Pain in left hip: Secondary | ICD-10-CM | POA: Diagnosis not present

## 2021-06-05 DIAGNOSIS — I480 Paroxysmal atrial fibrillation: Secondary | ICD-10-CM | POA: Diagnosis not present

## 2021-06-05 DIAGNOSIS — D509 Iron deficiency anemia, unspecified: Secondary | ICD-10-CM | POA: Diagnosis not present

## 2021-06-05 DIAGNOSIS — M6281 Muscle weakness (generalized): Secondary | ICD-10-CM | POA: Diagnosis not present

## 2021-06-05 DIAGNOSIS — D649 Anemia, unspecified: Secondary | ICD-10-CM | POA: Diagnosis not present

## 2021-06-05 DIAGNOSIS — I4891 Unspecified atrial fibrillation: Secondary | ICD-10-CM | POA: Diagnosis not present

## 2021-06-05 DIAGNOSIS — I4581 Long QT syndrome: Secondary | ICD-10-CM | POA: Diagnosis not present

## 2021-06-05 DIAGNOSIS — M10061 Idiopathic gout, right knee: Secondary | ICD-10-CM | POA: Diagnosis not present

## 2021-06-05 DIAGNOSIS — E785 Hyperlipidemia, unspecified: Secondary | ICD-10-CM | POA: Diagnosis not present

## 2021-06-05 DIAGNOSIS — R55 Syncope and collapse: Secondary | ICD-10-CM | POA: Diagnosis not present

## 2021-06-05 DIAGNOSIS — R7881 Bacteremia: Secondary | ICD-10-CM | POA: Diagnosis not present

## 2021-06-05 DIAGNOSIS — G928 Other toxic encephalopathy: Secondary | ICD-10-CM | POA: Diagnosis not present

## 2021-06-05 DIAGNOSIS — I2510 Atherosclerotic heart disease of native coronary artery without angina pectoris: Secondary | ICD-10-CM | POA: Diagnosis not present

## 2021-06-05 DIAGNOSIS — G932 Benign intracranial hypertension: Secondary | ICD-10-CM | POA: Diagnosis not present

## 2021-06-05 DIAGNOSIS — G44059 Short lasting unilateral neuralgiform headache with conjunctival injection and tearing (SUNCT), not intractable: Secondary | ICD-10-CM | POA: Diagnosis not present

## 2021-06-05 DIAGNOSIS — M810 Age-related osteoporosis without current pathological fracture: Secondary | ICD-10-CM | POA: Diagnosis not present

## 2021-06-05 DIAGNOSIS — R41841 Cognitive communication deficit: Secondary | ICD-10-CM | POA: Diagnosis not present

## 2021-06-05 DIAGNOSIS — B952 Enterococcus as the cause of diseases classified elsewhere: Secondary | ICD-10-CM | POA: Diagnosis not present

## 2021-06-05 DIAGNOSIS — R410 Disorientation, unspecified: Secondary | ICD-10-CM | POA: Diagnosis not present

## 2021-06-05 DIAGNOSIS — M109 Gout, unspecified: Secondary | ICD-10-CM | POA: Diagnosis not present

## 2021-06-05 DIAGNOSIS — N189 Chronic kidney disease, unspecified: Secondary | ICD-10-CM | POA: Diagnosis not present

## 2021-06-05 DIAGNOSIS — G038 Meningitis due to other specified causes: Secondary | ICD-10-CM | POA: Diagnosis not present

## 2021-06-05 DIAGNOSIS — R2689 Other abnormalities of gait and mobility: Secondary | ICD-10-CM | POA: Diagnosis not present

## 2021-06-05 DIAGNOSIS — N400 Enlarged prostate without lower urinary tract symptoms: Secondary | ICD-10-CM | POA: Diagnosis not present

## 2021-06-05 DIAGNOSIS — N1831 Chronic kidney disease, stage 3a: Secondary | ICD-10-CM | POA: Diagnosis not present

## 2021-06-05 DIAGNOSIS — Z743 Need for continuous supervision: Secondary | ICD-10-CM | POA: Diagnosis not present

## 2021-06-05 DIAGNOSIS — M25522 Pain in left elbow: Secondary | ICD-10-CM | POA: Diagnosis not present

## 2021-06-05 DIAGNOSIS — B451 Cerebral cryptococcosis: Secondary | ICD-10-CM | POA: Diagnosis not present

## 2021-06-06 DIAGNOSIS — B451 Cerebral cryptococcosis: Secondary | ICD-10-CM | POA: Diagnosis not present

## 2021-06-06 DIAGNOSIS — M25522 Pain in left elbow: Secondary | ICD-10-CM | POA: Diagnosis not present

## 2021-06-06 DIAGNOSIS — N189 Chronic kidney disease, unspecified: Secondary | ICD-10-CM | POA: Diagnosis not present

## 2021-06-06 DIAGNOSIS — G038 Meningitis due to other specified causes: Secondary | ICD-10-CM | POA: Diagnosis not present

## 2021-06-06 DIAGNOSIS — D649 Anemia, unspecified: Secondary | ICD-10-CM | POA: Diagnosis not present

## 2021-06-06 DIAGNOSIS — M25552 Pain in left hip: Secondary | ICD-10-CM | POA: Diagnosis not present

## 2021-06-06 DIAGNOSIS — N1831 Chronic kidney disease, stage 3a: Secondary | ICD-10-CM | POA: Diagnosis not present

## 2021-06-06 DIAGNOSIS — R262 Difficulty in walking, not elsewhere classified: Secondary | ICD-10-CM | POA: Diagnosis not present

## 2021-06-09 DIAGNOSIS — N189 Chronic kidney disease, unspecified: Secondary | ICD-10-CM | POA: Diagnosis not present

## 2021-06-09 DIAGNOSIS — G039 Meningitis, unspecified: Secondary | ICD-10-CM | POA: Diagnosis not present

## 2021-06-09 DIAGNOSIS — G928 Other toxic encephalopathy: Secondary | ICD-10-CM | POA: Diagnosis not present

## 2021-06-16 DIAGNOSIS — B451 Cerebral cryptococcosis: Secondary | ICD-10-CM | POA: Diagnosis not present

## 2021-06-16 DIAGNOSIS — D649 Anemia, unspecified: Secondary | ICD-10-CM | POA: Diagnosis not present

## 2021-06-24 DIAGNOSIS — G039 Meningitis, unspecified: Secondary | ICD-10-CM | POA: Diagnosis not present

## 2021-06-24 DIAGNOSIS — N189 Chronic kidney disease, unspecified: Secondary | ICD-10-CM | POA: Diagnosis not present

## 2021-06-24 DIAGNOSIS — D649 Anemia, unspecified: Secondary | ICD-10-CM | POA: Diagnosis not present

## 2021-06-24 DIAGNOSIS — B451 Cerebral cryptococcosis: Secondary | ICD-10-CM | POA: Diagnosis not present

## 2021-07-01 DIAGNOSIS — G039 Meningitis, unspecified: Secondary | ICD-10-CM | POA: Diagnosis not present

## 2021-07-01 DIAGNOSIS — N189 Chronic kidney disease, unspecified: Secondary | ICD-10-CM | POA: Diagnosis not present

## 2021-07-01 DIAGNOSIS — D649 Anemia, unspecified: Secondary | ICD-10-CM | POA: Diagnosis not present

## 2021-07-07 DIAGNOSIS — E785 Hyperlipidemia, unspecified: Secondary | ICD-10-CM | POA: Diagnosis not present

## 2021-07-07 DIAGNOSIS — R2689 Other abnormalities of gait and mobility: Secondary | ICD-10-CM | POA: Diagnosis not present

## 2021-07-07 DIAGNOSIS — H905 Unspecified sensorineural hearing loss: Secondary | ICD-10-CM | POA: Diagnosis not present

## 2021-07-07 DIAGNOSIS — I129 Hypertensive chronic kidney disease with stage 1 through stage 4 chronic kidney disease, or unspecified chronic kidney disease: Secondary | ICD-10-CM | POA: Diagnosis not present

## 2021-07-07 DIAGNOSIS — E46 Unspecified protein-calorie malnutrition: Secondary | ICD-10-CM | POA: Diagnosis not present

## 2021-07-07 DIAGNOSIS — I2510 Atherosclerotic heart disease of native coronary artery without angina pectoris: Secondary | ICD-10-CM | POA: Diagnosis not present

## 2021-07-07 DIAGNOSIS — I480 Paroxysmal atrial fibrillation: Secondary | ICD-10-CM | POA: Diagnosis not present

## 2021-07-07 DIAGNOSIS — N183 Chronic kidney disease, stage 3 unspecified: Secondary | ICD-10-CM | POA: Diagnosis not present

## 2021-07-07 DIAGNOSIS — D631 Anemia in chronic kidney disease: Secondary | ICD-10-CM | POA: Diagnosis not present

## 2021-07-07 DIAGNOSIS — B451 Cerebral cryptococcosis: Secondary | ICD-10-CM | POA: Diagnosis not present

## 2021-07-07 DIAGNOSIS — K219 Gastro-esophageal reflux disease without esophagitis: Secondary | ICD-10-CM | POA: Diagnosis not present

## 2021-07-07 DIAGNOSIS — M542 Cervicalgia: Secondary | ICD-10-CM | POA: Diagnosis not present

## 2021-07-07 DIAGNOSIS — Z7952 Long term (current) use of systemic steroids: Secondary | ICD-10-CM | POA: Diagnosis not present

## 2021-07-07 DIAGNOSIS — F419 Anxiety disorder, unspecified: Secondary | ICD-10-CM | POA: Diagnosis not present

## 2021-07-07 DIAGNOSIS — G928 Other toxic encephalopathy: Secondary | ICD-10-CM | POA: Diagnosis not present

## 2021-07-07 DIAGNOSIS — Z7902 Long term (current) use of antithrombotics/antiplatelets: Secondary | ICD-10-CM | POA: Diagnosis not present

## 2021-07-07 DIAGNOSIS — K5900 Constipation, unspecified: Secondary | ICD-10-CM | POA: Diagnosis not present

## 2021-07-07 DIAGNOSIS — I252 Old myocardial infarction: Secondary | ICD-10-CM | POA: Diagnosis not present

## 2021-07-07 DIAGNOSIS — M810 Age-related osteoporosis without current pathological fracture: Secondary | ICD-10-CM | POA: Diagnosis not present

## 2021-07-07 DIAGNOSIS — M109 Gout, unspecified: Secondary | ICD-10-CM | POA: Diagnosis not present

## 2021-07-07 DIAGNOSIS — N400 Enlarged prostate without lower urinary tract symptoms: Secondary | ICD-10-CM | POA: Diagnosis not present

## 2021-07-07 DIAGNOSIS — Z7901 Long term (current) use of anticoagulants: Secondary | ICD-10-CM | POA: Diagnosis not present

## 2021-07-11 DIAGNOSIS — B451 Cerebral cryptococcosis: Secondary | ICD-10-CM | POA: Diagnosis not present

## 2021-07-11 DIAGNOSIS — I480 Paroxysmal atrial fibrillation: Secondary | ICD-10-CM | POA: Diagnosis not present

## 2021-07-11 DIAGNOSIS — E785 Hyperlipidemia, unspecified: Secondary | ICD-10-CM | POA: Diagnosis not present

## 2021-07-11 DIAGNOSIS — R0600 Dyspnea, unspecified: Secondary | ICD-10-CM | POA: Diagnosis not present

## 2021-07-11 DIAGNOSIS — Z7901 Long term (current) use of anticoagulants: Secondary | ICD-10-CM | POA: Diagnosis not present

## 2021-07-11 DIAGNOSIS — I252 Old myocardial infarction: Secondary | ICD-10-CM | POA: Diagnosis not present

## 2021-07-11 DIAGNOSIS — I10 Essential (primary) hypertension: Secondary | ICD-10-CM | POA: Diagnosis not present

## 2021-07-11 DIAGNOSIS — R002 Palpitations: Secondary | ICD-10-CM | POA: Diagnosis not present

## 2021-07-11 DIAGNOSIS — I2510 Atherosclerotic heart disease of native coronary artery without angina pectoris: Secondary | ICD-10-CM | POA: Diagnosis not present

## 2021-07-11 DIAGNOSIS — Z955 Presence of coronary angioplasty implant and graft: Secondary | ICD-10-CM | POA: Diagnosis not present

## 2021-07-11 DIAGNOSIS — R0602 Shortness of breath: Secondary | ICD-10-CM | POA: Diagnosis not present

## 2021-07-15 DIAGNOSIS — I10 Essential (primary) hypertension: Secondary | ICD-10-CM | POA: Diagnosis not present

## 2021-07-15 DIAGNOSIS — I4891 Unspecified atrial fibrillation: Secondary | ICD-10-CM | POA: Diagnosis not present

## 2021-07-15 DIAGNOSIS — Z79899 Other long term (current) drug therapy: Secondary | ICD-10-CM | POA: Diagnosis not present

## 2021-07-15 DIAGNOSIS — B451 Cerebral cryptococcosis: Secondary | ICD-10-CM | POA: Diagnosis not present

## 2021-07-15 DIAGNOSIS — K219 Gastro-esophageal reflux disease without esophagitis: Secondary | ICD-10-CM | POA: Diagnosis not present

## 2021-07-15 DIAGNOSIS — Z09 Encounter for follow-up examination after completed treatment for conditions other than malignant neoplasm: Secondary | ICD-10-CM | POA: Diagnosis not present

## 2021-07-15 DIAGNOSIS — D692 Other nonthrombocytopenic purpura: Secondary | ICD-10-CM | POA: Diagnosis not present

## 2021-07-15 DIAGNOSIS — T148XXA Other injury of unspecified body region, initial encounter: Secondary | ICD-10-CM | POA: Diagnosis not present

## 2021-07-15 DIAGNOSIS — Z6822 Body mass index (BMI) 22.0-22.9, adult: Secondary | ICD-10-CM | POA: Diagnosis not present

## 2021-07-16 DIAGNOSIS — R42 Dizziness and giddiness: Secondary | ICD-10-CM | POA: Diagnosis not present

## 2021-07-16 DIAGNOSIS — G319 Degenerative disease of nervous system, unspecified: Secondary | ICD-10-CM | POA: Diagnosis not present

## 2021-07-16 DIAGNOSIS — R531 Weakness: Secondary | ICD-10-CM | POA: Diagnosis not present

## 2021-07-16 DIAGNOSIS — R0602 Shortness of breath: Secondary | ICD-10-CM | POA: Diagnosis not present

## 2021-07-16 DIAGNOSIS — I480 Paroxysmal atrial fibrillation: Secondary | ICD-10-CM | POA: Diagnosis not present

## 2021-07-16 DIAGNOSIS — Z9181 History of falling: Secondary | ICD-10-CM | POA: Diagnosis not present

## 2021-07-16 DIAGNOSIS — I951 Orthostatic hypotension: Secondary | ICD-10-CM | POA: Diagnosis not present

## 2021-07-16 DIAGNOSIS — R296 Repeated falls: Secondary | ICD-10-CM | POA: Diagnosis not present

## 2021-07-21 ENCOUNTER — Encounter: Payer: Self-pay | Admitting: Diagnostic Neuroimaging

## 2021-07-21 ENCOUNTER — Ambulatory Visit: Payer: PPO | Admitting: Diagnostic Neuroimaging

## 2021-07-23 DIAGNOSIS — I480 Paroxysmal atrial fibrillation: Secondary | ICD-10-CM | POA: Diagnosis not present

## 2021-07-28 ENCOUNTER — Telehealth: Payer: Self-pay | Admitting: Oncology

## 2021-07-28 NOTE — Telephone Encounter (Signed)
Patient referred by Dr Jenean Lindau for Mild Chronic Anemia.  Appt made 08/11/21 Labs 2:45 pm - Consult 3:15 pm

## 2021-07-29 DIAGNOSIS — I480 Paroxysmal atrial fibrillation: Secondary | ICD-10-CM | POA: Diagnosis not present

## 2021-08-04 DIAGNOSIS — Z7952 Long term (current) use of systemic steroids: Secondary | ICD-10-CM | POA: Diagnosis not present

## 2021-08-04 DIAGNOSIS — Z23 Encounter for immunization: Secondary | ICD-10-CM | POA: Diagnosis not present

## 2021-08-04 DIAGNOSIS — I10 Essential (primary) hypertension: Secondary | ICD-10-CM | POA: Diagnosis not present

## 2021-08-04 DIAGNOSIS — I2510 Atherosclerotic heart disease of native coronary artery without angina pectoris: Secondary | ICD-10-CM | POA: Diagnosis not present

## 2021-08-04 DIAGNOSIS — Z8661 Personal history of infections of the central nervous system: Secondary | ICD-10-CM | POA: Diagnosis not present

## 2021-08-04 DIAGNOSIS — M109 Gout, unspecified: Secondary | ICD-10-CM | POA: Diagnosis not present

## 2021-08-04 DIAGNOSIS — B451 Cerebral cryptococcosis: Secondary | ICD-10-CM | POA: Diagnosis not present

## 2021-08-04 DIAGNOSIS — Z79899 Other long term (current) drug therapy: Secondary | ICD-10-CM | POA: Diagnosis not present

## 2021-08-04 DIAGNOSIS — I252 Old myocardial infarction: Secondary | ICD-10-CM | POA: Diagnosis not present

## 2021-08-05 DIAGNOSIS — M109 Gout, unspecified: Secondary | ICD-10-CM | POA: Diagnosis not present

## 2021-08-05 DIAGNOSIS — Z79899 Other long term (current) drug therapy: Secondary | ICD-10-CM | POA: Diagnosis not present

## 2021-08-05 DIAGNOSIS — B451 Cerebral cryptococcosis: Secondary | ICD-10-CM | POA: Diagnosis not present

## 2021-08-05 DIAGNOSIS — D849 Immunodeficiency, unspecified: Secondary | ICD-10-CM | POA: Diagnosis not present

## 2021-08-06 DIAGNOSIS — I2510 Atherosclerotic heart disease of native coronary artery without angina pectoris: Secondary | ICD-10-CM | POA: Diagnosis not present

## 2021-08-06 DIAGNOSIS — Z7901 Long term (current) use of anticoagulants: Secondary | ICD-10-CM | POA: Diagnosis not present

## 2021-08-06 DIAGNOSIS — M542 Cervicalgia: Secondary | ICD-10-CM | POA: Diagnosis not present

## 2021-08-06 DIAGNOSIS — H905 Unspecified sensorineural hearing loss: Secondary | ICD-10-CM | POA: Diagnosis not present

## 2021-08-06 DIAGNOSIS — G928 Other toxic encephalopathy: Secondary | ICD-10-CM | POA: Diagnosis not present

## 2021-08-06 DIAGNOSIS — D631 Anemia in chronic kidney disease: Secondary | ICD-10-CM | POA: Diagnosis not present

## 2021-08-06 DIAGNOSIS — E785 Hyperlipidemia, unspecified: Secondary | ICD-10-CM | POA: Diagnosis not present

## 2021-08-06 DIAGNOSIS — I480 Paroxysmal atrial fibrillation: Secondary | ICD-10-CM | POA: Diagnosis not present

## 2021-08-06 DIAGNOSIS — F419 Anxiety disorder, unspecified: Secondary | ICD-10-CM | POA: Diagnosis not present

## 2021-08-06 DIAGNOSIS — E46 Unspecified protein-calorie malnutrition: Secondary | ICD-10-CM | POA: Diagnosis not present

## 2021-08-06 DIAGNOSIS — I129 Hypertensive chronic kidney disease with stage 1 through stage 4 chronic kidney disease, or unspecified chronic kidney disease: Secondary | ICD-10-CM | POA: Diagnosis not present

## 2021-08-06 DIAGNOSIS — Z7902 Long term (current) use of antithrombotics/antiplatelets: Secondary | ICD-10-CM | POA: Diagnosis not present

## 2021-08-06 DIAGNOSIS — M810 Age-related osteoporosis without current pathological fracture: Secondary | ICD-10-CM | POA: Diagnosis not present

## 2021-08-06 DIAGNOSIS — Z8719 Personal history of other diseases of the digestive system: Secondary | ICD-10-CM | POA: Diagnosis not present

## 2021-08-06 DIAGNOSIS — N400 Enlarged prostate without lower urinary tract symptoms: Secondary | ICD-10-CM | POA: Diagnosis not present

## 2021-08-06 DIAGNOSIS — N183 Chronic kidney disease, stage 3 unspecified: Secondary | ICD-10-CM | POA: Diagnosis not present

## 2021-08-06 DIAGNOSIS — M109 Gout, unspecified: Secondary | ICD-10-CM | POA: Diagnosis not present

## 2021-08-06 DIAGNOSIS — B451 Cerebral cryptococcosis: Secondary | ICD-10-CM | POA: Diagnosis not present

## 2021-08-06 DIAGNOSIS — K5900 Constipation, unspecified: Secondary | ICD-10-CM | POA: Diagnosis not present

## 2021-08-06 DIAGNOSIS — J40 Bronchitis, not specified as acute or chronic: Secondary | ICD-10-CM | POA: Diagnosis not present

## 2021-08-06 DIAGNOSIS — Z7952 Long term (current) use of systemic steroids: Secondary | ICD-10-CM | POA: Diagnosis not present

## 2021-08-06 DIAGNOSIS — I252 Old myocardial infarction: Secondary | ICD-10-CM | POA: Diagnosis not present

## 2021-08-06 DIAGNOSIS — K219 Gastro-esophageal reflux disease without esophagitis: Secondary | ICD-10-CM | POA: Diagnosis not present

## 2021-08-10 ENCOUNTER — Other Ambulatory Visit: Payer: Self-pay | Admitting: Oncology

## 2021-08-10 DIAGNOSIS — D539 Nutritional anemia, unspecified: Secondary | ICD-10-CM

## 2021-08-10 DIAGNOSIS — E538 Deficiency of other specified B group vitamins: Secondary | ICD-10-CM
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
## 2021-08-11 ENCOUNTER — Other Ambulatory Visit: Payer: Self-pay | Admitting: Hematology and Oncology

## 2021-08-11 ENCOUNTER — Inpatient Hospital Stay: Payer: PPO | Attending: Oncology

## 2021-08-11 ENCOUNTER — Telehealth: Payer: Self-pay | Admitting: Oncology

## 2021-08-11 ENCOUNTER — Inpatient Hospital Stay (INDEPENDENT_AMBULATORY_CARE_PROVIDER_SITE_OTHER): Payer: PPO | Admitting: Oncology

## 2021-08-11 ENCOUNTER — Encounter: Payer: Self-pay | Admitting: Oncology

## 2021-08-11 DIAGNOSIS — N289 Disorder of kidney and ureter, unspecified: Secondary | ICD-10-CM | POA: Insufficient documentation

## 2021-08-11 DIAGNOSIS — Z79899 Other long term (current) drug therapy: Secondary | ICD-10-CM | POA: Insufficient documentation

## 2021-08-11 DIAGNOSIS — Z803 Family history of malignant neoplasm of breast: Secondary | ICD-10-CM

## 2021-08-11 DIAGNOSIS — E538 Deficiency of other specified B group vitamins: Secondary | ICD-10-CM | POA: Diagnosis not present

## 2021-08-11 DIAGNOSIS — D649 Anemia, unspecified: Secondary | ICD-10-CM

## 2021-08-11 DIAGNOSIS — Z8052 Family history of malignant neoplasm of bladder: Secondary | ICD-10-CM | POA: Diagnosis not present

## 2021-08-11 DIAGNOSIS — D539 Nutritional anemia, unspecified: Secondary | ICD-10-CM

## 2021-08-11 DIAGNOSIS — Z808 Family history of malignant neoplasm of other organs or systems: Secondary | ICD-10-CM | POA: Insufficient documentation

## 2021-08-11 DIAGNOSIS — Z993 Dependence on wheelchair: Secondary | ICD-10-CM

## 2021-08-11 LAB — FOLATE: Folate: 9 ng/mL (ref >5.9)

## 2021-08-11 LAB — IRON AND TIBC
Iron: 59 ug/dL (ref 45–182)
Saturation Ratios: 20 % (ref 17.9–39.5)
TIBC: 301 ug/dL (ref 250–450)
UIBC: 242 ug/dL

## 2021-08-11 LAB — BASIC METABOLIC PANEL
BUN: 26 — AB (ref 4–21)
CO2: 25 — AB (ref 13–22)
Chloride: 109 — AB (ref 99–108)
Creatinine: 1.5 — AB (ref 0.6–1.3)
Potassium: 4.1 (ref 3.4–5.3)
Sodium: 141 (ref 137–147)

## 2021-08-11 LAB — VITAMIN B12: Vitamin B-12: 139 pg/mL — ABNORMAL LOW (ref 180–914)

## 2021-08-11 LAB — COMPREHENSIVE METABOLIC PANEL
Albumin: 3.5 (ref 3.5–5.0)
Calcium: 8.4 — AB (ref 8.7–10.7)

## 2021-08-11 LAB — CBC AND DIFFERENTIAL
HCT: 29 — AB (ref 41–53)
Hemoglobin: 9.2 — AB (ref 13.5–17.5)
Neutrophils Absolute: 6.84
Platelets: 301 (ref 150–399)
WBC: 7.6

## 2021-08-11 LAB — HEPATIC FUNCTION PANEL
ALT: 37 (ref 10–40)
AST: 42 — AB (ref 14–40)
Alkaline Phosphatase: 72 (ref 25–125)
Bilirubin, Total: 0.3

## 2021-08-11 LAB — CBC: RBC: 2.83 — AB (ref 3.87–5.11)

## 2021-08-11 LAB — TSH: TSH: 2.156 u[IU]/mL (ref 0.350–4.500)

## 2021-08-11 LAB — FERRITIN: Ferritin: 332 ng/mL (ref 24–336)

## 2021-08-11 NOTE — Telephone Encounter (Signed)
Per 10/3 LOS, patient scheduled for 10/17 Follow Up.  Gave pateint Appt Summary

## 2021-08-12 DIAGNOSIS — D649 Anemia, unspecified: Secondary | ICD-10-CM | POA: Insufficient documentation

## 2021-08-12 DIAGNOSIS — N189 Chronic kidney disease, unspecified: Secondary | ICD-10-CM | POA: Insufficient documentation

## 2021-08-12 DIAGNOSIS — B451 Cerebral cryptococcosis: Secondary | ICD-10-CM | POA: Diagnosis not present

## 2021-08-12 DIAGNOSIS — D631 Anemia in chronic kidney disease: Secondary | ICD-10-CM | POA: Insufficient documentation

## 2021-08-13 LAB — PROTEIN ELECTROPHORESIS, SERUM
A/G Ratio: 0.9 (ref 0.7–1.7)
Albumin ELP: 2.8 g/dL — ABNORMAL LOW (ref 2.9–4.4)
Alpha-1-Globulin: 0.3 g/dL (ref 0.0–0.4)
Alpha-2-Globulin: 1 g/dL (ref 0.4–1.0)
Beta Globulin: 0.9 g/dL (ref 0.7–1.3)
Gamma Globulin: 0.8 g/dL (ref 0.4–1.8)
Globulin, Total: 3 g/dL (ref 2.2–3.9)
Total Protein ELP: 5.8 g/dL — ABNORMAL LOW (ref 6.0–8.5)

## 2021-08-14 DIAGNOSIS — E785 Hyperlipidemia, unspecified: Secondary | ICD-10-CM | POA: Diagnosis not present

## 2021-08-14 DIAGNOSIS — I4891 Unspecified atrial fibrillation: Secondary | ICD-10-CM | POA: Diagnosis not present

## 2021-08-14 DIAGNOSIS — D649 Anemia, unspecified: Secondary | ICD-10-CM | POA: Diagnosis not present

## 2021-08-14 DIAGNOSIS — I10 Essential (primary) hypertension: Secondary | ICD-10-CM | POA: Diagnosis not present

## 2021-08-19 NOTE — Progress Notes (Signed)
[facility]  [address] [place],  [place]  [address] [phone]  Clinic Day:  08/25/2021  Referring physician: Helen Hashimoto., MD  This document serves as a record of services personally performed by Elka Satterfield Macarthur Critchley, MD. It was created on their behalf by [facility], a trained medical scribe. The creation of this record is based on the scribe's personal observations and the provider's statements to them.  HISTORY OF PRESENT ILLNESS:  The patient is a 75 y.o. male  who I recently began seeing for macrocytic anemia.  He comes in today to go over all of his labs today to determine the etiology behind his anemia.  Since his last visit, the patient has been doing okay.  He has baseline fatigue, but denies having any overt forms of blood loss.  PHYSICAL EXAM:  Blood pressure 113/71, pulse 97, temperature 97.9 F (36.6 C), resp. rate 16, height 5' 10.5" (1.791 m), weight 166 lb 1.6 oz (75.3 kg), SpO2 99 %. Wt Readings from Last 3 Encounters:  08/25/21 166 lb (75.3 kg)  08/25/21 166 lb 1.6 oz (75.3 kg)  08/11/21 166 lb 12.8 oz (75.7 kg)   Body mass index is 23.5 kg/m. Performance status (ECOG): 2 - Symptomatic, <50% confined to bed Physical Exam Constitutional:      Appearance: Normal appearance. He is not ill-appearing.     Comments: He is in a wheelchair  HENT:     Mouth/Throat:     Mouth: Mucous membranes are moist.     Pharynx: Oropharynx is clear. No oropharyngeal exudate or posterior oropharyngeal erythema.  Cardiovascular:     Rate and Rhythm: Normal rate and regular rhythm.     Heart sounds: No murmur heard.   No friction rub. No gallop.  Pulmonary:     Effort: Pulmonary effort is normal. No respiratory distress.     Breath sounds: Normal breath sounds. No wheezing, rhonchi or rales.  Abdominal:     General: Bowel sounds are normal. There is no distension.     Palpations: Abdomen is soft. There is no mass.     Tenderness:  There is no abdominal tenderness.  Musculoskeletal:        General: No swelling.     Right lower leg: No edema.     Left lower leg: No edema.  Lymphadenopathy:     Cervical: No cervical adenopathy.     Upper Body:     Right upper body: No supraclavicular or axillary adenopathy.     Left upper body: No supraclavicular or axillary adenopathy.     Lower Body: No right inguinal adenopathy. No left inguinal adenopathy.  Skin:    General: Skin is warm.     Coloration: Skin is not jaundiced.     Findings: No lesion or rash.  Neurological:     General: No focal deficit present.     Mental Status: He is alert and oriented to person, place, and time. Mental status is at baseline.     Cranial Nerves: Cranial nerves are intact.  Psychiatric:        Mood and Affect: Mood normal.        Behavior: Behavior normal.        Thought Content: Thought content normal.   LABS:    CMP Latest Ref Rng & Units 08/11/2021 04/24/2021 10/21/2017  Glucose 70 - 99 mg/dL - 125(H) 88  BUN 4 - 21 26(A) 36(H) 34(H)  Creatinine 0.6 - 1.3 1.5(A)  1.80(H) 1.88(H)  Sodium 137 - 147 141 136 140  Potassium 3.4 - 5.3 4.1 4.0 3.8  Chloride 99 - 108 109(A) 102 107  CO2 13 - 22 25(A) - 23  Calcium 8.7 - 10.7 8.4(A) - 9.1  Alkaline Phos 25 - 125 72 - -  AST 14 - 40 42(A) - -  ALT 10 - 40 37 - -   Iron 45 - 182 ug/dL 59   TIBC 250 - 450 ug/dL 301   Saturation Ratios 17.9 - 39.5 % 20   UIBC ug/dL 242    Ferritin 24 - 336 ng/mL 332    Vitamin B-12 180 - 914 pg/mL 139 Low     Folate >5.9 ng/mL 9.0    TSH 0.350 - 4.500 uIU/mL 2.156    Total Protein ELP 6.0 - 8.5 g/dL 5.8 Low    Albumin ELP 2.9 - 4.4 g/dL 2.8 Low    Alpha-1-Globulin 0.0 - 0.4 g/dL 0.3   Alpha-2-Globulin 0.4 - 1.0 g/dL 1.0   Beta Globulin 0.7 - 1.3 g/dL 0.9   Gamma Globulin 0.4 - 1.8 g/dL 0.8   M-Spike, % Not Observed g/dL Not Observed    Globulin, Total 2.2 - 3.9 g/dL 3.0 VC   A/G Ratio 0.7 - 1.7 0.9 VC    ASSESSMENT & PLAN:  A 75 y.o. male  with macrocytic anemia.  Based upon recent labs, he has vitamin B12 deficiency.  Based upon this, I will arrange for him to receive B12 1000 mcg shots daily x 7 days, then weekly x 4 weeks, then monthly indefinitely.  A component of his anemia is likely also due to his renal insufficiency.   Of note, he is scheduled to see nephrology tomorrow.  I will see him back in 3 months for repeat clinical assessment.  If his hemoglobin remains low at that time, I would either consider giving him red cell shot therapy or potentially performing a bone marrow biopsy for further evaluation. The patient and his son understand all the plans discussed today and are in agreement with them.  I, Rita Ohara, am acting as scribe for Marice Potter, MD    I have reviewed this report as typed by the medical scribe, and it is complete and accurate.  Terrye Dombrosky Macarthur Critchley, MD

## 2021-08-25 ENCOUNTER — Inpatient Hospital Stay (INDEPENDENT_AMBULATORY_CARE_PROVIDER_SITE_OTHER): Payer: PPO | Admitting: Oncology

## 2021-08-25 ENCOUNTER — Inpatient Hospital Stay: Payer: PPO

## 2021-08-25 ENCOUNTER — Encounter: Payer: Self-pay | Admitting: Oncology

## 2021-08-25 ENCOUNTER — Other Ambulatory Visit: Payer: Self-pay | Admitting: Oncology

## 2021-08-25 ENCOUNTER — Other Ambulatory Visit: Payer: Self-pay

## 2021-08-25 VITALS — BP 107/73 | HR 84 | Temp 97.9°F | Resp 18 | Ht 70.5 in | Wt 166.0 lb

## 2021-08-25 VITALS — BP 113/71 | HR 97 | Temp 97.9°F | Resp 16 | Ht 70.5 in | Wt 166.1 lb

## 2021-08-25 DIAGNOSIS — D649 Anemia, unspecified: Secondary | ICD-10-CM

## 2021-08-25 DIAGNOSIS — E538 Deficiency of other specified B group vitamins: Secondary | ICD-10-CM

## 2021-08-25 MED ORDER — CYANOCOBALAMIN 1000 MCG/ML IJ SOLN
1000.0000 ug | Freq: Once | INTRAMUSCULAR | Status: AC
Start: 2021-08-25 — End: 2021-08-25
  Administered 2021-08-25: 1000 ug via INTRAMUSCULAR
  Filled 2021-08-25: qty 1

## 2021-08-25 NOTE — Patient Instructions (Signed)

## 2021-08-26 DIAGNOSIS — Z79899 Other long term (current) drug therapy: Secondary | ICD-10-CM | POA: Diagnosis not present

## 2021-08-26 DIAGNOSIS — B451 Cerebral cryptococcosis: Secondary | ICD-10-CM | POA: Diagnosis not present

## 2021-08-26 DIAGNOSIS — Z87442 Personal history of urinary calculi: Secondary | ICD-10-CM | POA: Diagnosis not present

## 2021-08-26 DIAGNOSIS — R000 Tachycardia, unspecified: Secondary | ICD-10-CM | POA: Diagnosis not present

## 2021-08-26 DIAGNOSIS — D519 Vitamin B12 deficiency anemia, unspecified: Secondary | ICD-10-CM | POA: Diagnosis not present

## 2021-08-26 DIAGNOSIS — N390 Urinary tract infection, site not specified: Secondary | ICD-10-CM | POA: Diagnosis not present

## 2021-08-26 DIAGNOSIS — N1831 Chronic kidney disease, stage 3a: Secondary | ICD-10-CM | POA: Diagnosis not present

## 2021-08-26 DIAGNOSIS — M109 Gout, unspecified: Secondary | ICD-10-CM | POA: Diagnosis not present

## 2021-08-26 DIAGNOSIS — D631 Anemia in chronic kidney disease: Secondary | ICD-10-CM | POA: Diagnosis not present

## 2021-08-26 DIAGNOSIS — R339 Retention of urine, unspecified: Secondary | ICD-10-CM | POA: Diagnosis not present

## 2021-08-26 DIAGNOSIS — I129 Hypertensive chronic kidney disease with stage 1 through stage 4 chronic kidney disease, or unspecified chronic kidney disease: Secondary | ICD-10-CM | POA: Diagnosis not present

## 2021-08-26 DIAGNOSIS — N1832 Chronic kidney disease, stage 3b: Secondary | ICD-10-CM | POA: Diagnosis not present

## 2021-08-27 DIAGNOSIS — R3129 Other microscopic hematuria: Secondary | ICD-10-CM | POA: Diagnosis not present

## 2021-08-28 DIAGNOSIS — R0602 Shortness of breath: Secondary | ICD-10-CM | POA: Diagnosis not present

## 2021-08-28 DIAGNOSIS — I2510 Atherosclerotic heart disease of native coronary artery without angina pectoris: Secondary | ICD-10-CM | POA: Diagnosis not present

## 2021-08-29 ENCOUNTER — Emergency Department (HOSPITAL_COMMUNITY)
Admission: EM | Admit: 2021-08-29 | Discharge: 2021-08-29 | Disposition: A | Discharge status: Home / Self Care | Payer: PPO | Attending: Emergency Medicine | Admitting: Emergency Medicine

## 2021-08-29 ENCOUNTER — Emergency Department (HOSPITAL_COMMUNITY): Payer: PPO

## 2021-08-29 ENCOUNTER — Other Ambulatory Visit: Payer: Self-pay

## 2021-08-29 ENCOUNTER — Encounter (HOSPITAL_COMMUNITY): Payer: Self-pay

## 2021-08-29 DIAGNOSIS — S0012XA Contusion of left eyelid and periocular area, initial encounter: Secondary | ICD-10-CM | POA: Diagnosis not present

## 2021-08-29 DIAGNOSIS — Z79899 Other long term (current) drug therapy: Secondary | ICD-10-CM | POA: Diagnosis not present

## 2021-08-29 DIAGNOSIS — I129 Hypertensive chronic kidney disease with stage 1 through stage 4 chronic kidney disease, or unspecified chronic kidney disease: Secondary | ICD-10-CM | POA: Diagnosis not present

## 2021-08-29 DIAGNOSIS — M1712 Unilateral primary osteoarthritis, left knee: Secondary | ICD-10-CM | POA: Diagnosis not present

## 2021-08-29 DIAGNOSIS — W01198A Fall on same level from slipping, tripping and stumbling with subsequent striking against other object, initial encounter: Secondary | ICD-10-CM | POA: Insufficient documentation

## 2021-08-29 DIAGNOSIS — I4891 Unspecified atrial fibrillation: Secondary | ICD-10-CM | POA: Diagnosis not present

## 2021-08-29 DIAGNOSIS — S40212A Abrasion of left shoulder, initial encounter: Secondary | ICD-10-CM | POA: Diagnosis not present

## 2021-08-29 DIAGNOSIS — S0292XA Unspecified fracture of facial bones, initial encounter for closed fracture: Secondary | ICD-10-CM | POA: Diagnosis not present

## 2021-08-29 DIAGNOSIS — S0990XA Unspecified injury of head, initial encounter: Secondary | ICD-10-CM

## 2021-08-29 DIAGNOSIS — M25562 Pain in left knee: Secondary | ICD-10-CM | POA: Insufficient documentation

## 2021-08-29 DIAGNOSIS — I2510 Atherosclerotic heart disease of native coronary artery without angina pectoris: Secondary | ICD-10-CM | POA: Diagnosis not present

## 2021-08-29 DIAGNOSIS — S51812A Laceration without foreign body of left forearm, initial encounter: Secondary | ICD-10-CM | POA: Insufficient documentation

## 2021-08-29 DIAGNOSIS — Z7902 Long term (current) use of antithrombotics/antiplatelets: Secondary | ICD-10-CM | POA: Insufficient documentation

## 2021-08-29 DIAGNOSIS — S0081XA Abrasion of other part of head, initial encounter: Secondary | ICD-10-CM | POA: Diagnosis not present

## 2021-08-29 DIAGNOSIS — S022XXA Fracture of nasal bones, initial encounter for closed fracture: Secondary | ICD-10-CM | POA: Diagnosis not present

## 2021-08-29 DIAGNOSIS — Z7901 Long term (current) use of anticoagulants: Secondary | ICD-10-CM | POA: Insufficient documentation

## 2021-08-29 DIAGNOSIS — Z85828 Personal history of other malignant neoplasm of skin: Secondary | ICD-10-CM | POA: Diagnosis not present

## 2021-08-29 DIAGNOSIS — N189 Chronic kidney disease, unspecified: Secondary | ICD-10-CM | POA: Diagnosis not present

## 2021-08-29 DIAGNOSIS — S0240DA Maxillary fracture, left side, initial encounter for closed fracture: Secondary | ICD-10-CM | POA: Insufficient documentation

## 2021-08-29 DIAGNOSIS — M25462 Effusion, left knee: Secondary | ICD-10-CM | POA: Diagnosis not present

## 2021-08-29 DIAGNOSIS — S41111A Laceration without foreign body of right upper arm, initial encounter: Secondary | ICD-10-CM | POA: Insufficient documentation

## 2021-08-29 DIAGNOSIS — S199XXA Unspecified injury of neck, initial encounter: Secondary | ICD-10-CM | POA: Diagnosis not present

## 2021-08-29 DIAGNOSIS — Y9289 Other specified places as the place of occurrence of the external cause: Secondary | ICD-10-CM | POA: Insufficient documentation

## 2021-08-29 DIAGNOSIS — T148XXA Other injury of unspecified body region, initial encounter: Secondary | ICD-10-CM

## 2021-08-29 DIAGNOSIS — I517 Cardiomegaly: Secondary | ICD-10-CM | POA: Diagnosis not present

## 2021-08-29 DIAGNOSIS — S61412A Laceration without foreign body of left hand, initial encounter: Secondary | ICD-10-CM | POA: Diagnosis not present

## 2021-08-29 DIAGNOSIS — W19XXXA Unspecified fall, initial encounter: Secondary | ICD-10-CM

## 2021-08-29 MED ORDER — TRAMADOL HCL 50 MG PO TABS: 50.0000 mg | ORAL_TABLET | Freq: Four times a day (QID) | ORAL | 0 refills | Status: DC | PRN

## 2021-08-29 MED ORDER — BACITRACIN ZINC 500 UNIT/GM EX OINT
TOPICAL_OINTMENT | CUTANEOUS | Status: AC
  Filled 2021-08-29: qty 3.6

## 2021-08-29 NOTE — Discharge Instructions (Addendum)
Make an appointment to follow-up with Dr. Redmond Baseman ear nose and throat for the left sided orbital fractures.  Take the tramadol as needed for pain.  He can keep the dressings on the skin tears that were placed today for the next 2 to 3 days.  Did then remove the dressings shower clean with soap and water reapply antibiotic ointment and nonadherent dressing.  Chest x-ray showed no evidence of any lung injury.  But certainly could have a bruised rib or may be even a missed fractured rib.  Expect to be sore in the chest area for the next couple weeks.

## 2021-08-29 NOTE — ED Notes (Signed)
Pt with skin tears to left shoulder, left forearm, left hand finger, right wrist.  Abrasion noted to left knee.

## 2021-08-29 NOTE — ED Notes (Signed)
Pts skin tears dressed with bacitracin, non adherent dressings and wrapped in coban.  D/c paperwork reviewed with pt, including prescriptions.  No questions at time of d/c. Pts son stated he will help pt dress and transfer to w/c.

## 2021-08-29 NOTE — ED Triage Notes (Signed)
Pt reports tripping and falling on his left side. He reports hitting his head but denies LOC. Endorses left sided chest pain, generalized body pain, headache, and blurred vision in left eye. Pt takes Plaxis and Eliquis. Denies confusion. A&O x4.
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
## 2021-09-05 DIAGNOSIS — D631 Anemia in chronic kidney disease: Secondary | ICD-10-CM | POA: Diagnosis not present

## 2021-09-05 DIAGNOSIS — N400 Enlarged prostate without lower urinary tract symptoms: Secondary | ICD-10-CM | POA: Diagnosis not present

## 2021-09-05 DIAGNOSIS — K5900 Constipation, unspecified: Secondary | ICD-10-CM | POA: Diagnosis not present

## 2021-09-05 DIAGNOSIS — M810 Age-related osteoporosis without current pathological fracture: Secondary | ICD-10-CM | POA: Diagnosis not present

## 2021-09-05 DIAGNOSIS — I252 Old myocardial infarction: Secondary | ICD-10-CM | POA: Diagnosis not present

## 2021-09-05 DIAGNOSIS — B451 Cerebral cryptococcosis: Secondary | ICD-10-CM | POA: Diagnosis not present

## 2021-09-05 DIAGNOSIS — Z7902 Long term (current) use of antithrombotics/antiplatelets: Secondary | ICD-10-CM | POA: Diagnosis not present

## 2021-09-05 DIAGNOSIS — E46 Unspecified protein-calorie malnutrition: Secondary | ICD-10-CM | POA: Diagnosis not present

## 2021-09-05 DIAGNOSIS — Z7901 Long term (current) use of anticoagulants: Secondary | ICD-10-CM | POA: Diagnosis not present

## 2021-09-05 DIAGNOSIS — K219 Gastro-esophageal reflux disease without esophagitis: Secondary | ICD-10-CM | POA: Diagnosis not present

## 2021-09-05 DIAGNOSIS — N183 Chronic kidney disease, stage 3 unspecified: Secondary | ICD-10-CM | POA: Diagnosis not present

## 2021-09-05 DIAGNOSIS — M542 Cervicalgia: Secondary | ICD-10-CM | POA: Diagnosis not present

## 2021-09-05 DIAGNOSIS — I2510 Atherosclerotic heart disease of native coronary artery without angina pectoris: Secondary | ICD-10-CM | POA: Diagnosis not present

## 2021-09-05 DIAGNOSIS — Z7952 Long term (current) use of systemic steroids: Secondary | ICD-10-CM | POA: Diagnosis not present

## 2021-09-05 DIAGNOSIS — G928 Other toxic encephalopathy: Secondary | ICD-10-CM | POA: Diagnosis not present

## 2021-09-05 DIAGNOSIS — F419 Anxiety disorder, unspecified: Secondary | ICD-10-CM | POA: Diagnosis not present

## 2021-09-05 DIAGNOSIS — H905 Unspecified sensorineural hearing loss: Secondary | ICD-10-CM | POA: Diagnosis not present

## 2021-09-05 DIAGNOSIS — M109 Gout, unspecified: Secondary | ICD-10-CM | POA: Diagnosis not present

## 2021-09-05 DIAGNOSIS — I129 Hypertensive chronic kidney disease with stage 1 through stage 4 chronic kidney disease, or unspecified chronic kidney disease: Secondary | ICD-10-CM | POA: Diagnosis not present

## 2021-09-05 DIAGNOSIS — I480 Paroxysmal atrial fibrillation: Secondary | ICD-10-CM | POA: Diagnosis not present

## 2021-09-05 DIAGNOSIS — E785 Hyperlipidemia, unspecified: Secondary | ICD-10-CM | POA: Diagnosis not present

## 2021-09-12 DIAGNOSIS — W19XXXA Unspecified fall, initial encounter: Secondary | ICD-10-CM | POA: Diagnosis not present

## 2021-09-12 DIAGNOSIS — R269 Unspecified abnormalities of gait and mobility: Secondary | ICD-10-CM | POA: Diagnosis not present

## 2021-09-12 DIAGNOSIS — Z8616 Personal history of COVID-19: Secondary | ICD-10-CM | POA: Diagnosis not present

## 2021-09-12 DIAGNOSIS — Z8619 Personal history of other infectious and parasitic diseases: Secondary | ICD-10-CM | POA: Diagnosis not present

## 2021-09-12 DIAGNOSIS — R0609 Other forms of dyspnea: Secondary | ICD-10-CM | POA: Diagnosis not present

## 2021-09-12 DIAGNOSIS — S0285XA Fracture of orbit, unspecified, initial encounter for closed fracture: Secondary | ICD-10-CM | POA: Diagnosis not present

## 2021-09-12 DIAGNOSIS — U099 Post covid-19 condition, unspecified: Secondary | ICD-10-CM | POA: Diagnosis not present

## 2021-09-12 DIAGNOSIS — S0512XA Contusion of eyeball and orbital tissues, left eye, initial encounter: Secondary | ICD-10-CM | POA: Diagnosis not present

## 2021-09-15 DIAGNOSIS — I10 Essential (primary) hypertension: Secondary | ICD-10-CM | POA: Diagnosis not present

## 2021-09-15 DIAGNOSIS — I2510 Atherosclerotic heart disease of native coronary artery without angina pectoris: Secondary | ICD-10-CM | POA: Diagnosis not present

## 2021-09-15 DIAGNOSIS — I480 Paroxysmal atrial fibrillation: Secondary | ICD-10-CM | POA: Diagnosis not present

## 2021-09-15 DIAGNOSIS — B451 Cerebral cryptococcosis: Secondary | ICD-10-CM | POA: Diagnosis not present

## 2021-09-15 DIAGNOSIS — Z955 Presence of coronary angioplasty implant and graft: Secondary | ICD-10-CM | POA: Diagnosis not present

## 2021-09-15 DIAGNOSIS — Z7901 Long term (current) use of anticoagulants: Secondary | ICD-10-CM | POA: Diagnosis not present

## 2021-09-15 DIAGNOSIS — E785 Hyperlipidemia, unspecified: Secondary | ICD-10-CM | POA: Diagnosis not present

## 2021-09-15 DIAGNOSIS — I252 Old myocardial infarction: Secondary | ICD-10-CM | POA: Diagnosis not present

## 2021-10-05 DIAGNOSIS — B451 Cerebral cryptococcosis: Secondary | ICD-10-CM | POA: Diagnosis not present

## 2021-10-05 DIAGNOSIS — M542 Cervicalgia: Secondary | ICD-10-CM | POA: Diagnosis not present

## 2021-10-05 DIAGNOSIS — I2510 Atherosclerotic heart disease of native coronary artery without angina pectoris: Secondary | ICD-10-CM | POA: Diagnosis not present

## 2021-10-05 DIAGNOSIS — Z7901 Long term (current) use of anticoagulants: Secondary | ICD-10-CM | POA: Diagnosis not present

## 2021-10-05 DIAGNOSIS — E785 Hyperlipidemia, unspecified: Secondary | ICD-10-CM | POA: Diagnosis not present

## 2021-10-05 DIAGNOSIS — Z7902 Long term (current) use of antithrombotics/antiplatelets: Secondary | ICD-10-CM | POA: Diagnosis not present

## 2021-10-05 DIAGNOSIS — D631 Anemia in chronic kidney disease: Secondary | ICD-10-CM | POA: Diagnosis not present

## 2021-10-05 DIAGNOSIS — M810 Age-related osteoporosis without current pathological fracture: Secondary | ICD-10-CM | POA: Diagnosis not present

## 2021-10-05 DIAGNOSIS — I129 Hypertensive chronic kidney disease with stage 1 through stage 4 chronic kidney disease, or unspecified chronic kidney disease: Secondary | ICD-10-CM | POA: Diagnosis not present

## 2021-10-05 DIAGNOSIS — H905 Unspecified sensorineural hearing loss: Secondary | ICD-10-CM | POA: Diagnosis not present

## 2021-10-05 DIAGNOSIS — N400 Enlarged prostate without lower urinary tract symptoms: Secondary | ICD-10-CM | POA: Diagnosis not present

## 2021-10-05 DIAGNOSIS — I252 Old myocardial infarction: Secondary | ICD-10-CM | POA: Diagnosis not present

## 2021-10-05 DIAGNOSIS — G928 Other toxic encephalopathy: Secondary | ICD-10-CM | POA: Diagnosis not present

## 2021-10-05 DIAGNOSIS — N183 Chronic kidney disease, stage 3 unspecified: Secondary | ICD-10-CM | POA: Diagnosis not present

## 2021-10-05 DIAGNOSIS — I480 Paroxysmal atrial fibrillation: Secondary | ICD-10-CM | POA: Diagnosis not present

## 2021-10-05 DIAGNOSIS — K5900 Constipation, unspecified: Secondary | ICD-10-CM | POA: Diagnosis not present

## 2021-10-05 DIAGNOSIS — Z7952 Long term (current) use of systemic steroids: Secondary | ICD-10-CM | POA: Diagnosis not present

## 2021-10-05 DIAGNOSIS — M109 Gout, unspecified: Secondary | ICD-10-CM | POA: Diagnosis not present

## 2021-10-05 DIAGNOSIS — K219 Gastro-esophageal reflux disease without esophagitis: Secondary | ICD-10-CM | POA: Diagnosis not present

## 2021-10-05 DIAGNOSIS — E46 Unspecified protein-calorie malnutrition: Secondary | ICD-10-CM | POA: Diagnosis not present

## 2021-10-05 DIAGNOSIS — F419 Anxiety disorder, unspecified: Secondary | ICD-10-CM | POA: Diagnosis not present

## 2021-10-06 DIAGNOSIS — Z6822 Body mass index (BMI) 22.0-22.9, adult: Secondary | ICD-10-CM | POA: Diagnosis not present

## 2021-10-06 DIAGNOSIS — K219 Gastro-esophageal reflux disease without esophagitis: Secondary | ICD-10-CM | POA: Diagnosis not present

## 2021-10-06 DIAGNOSIS — Z79899 Other long term (current) drug therapy: Secondary | ICD-10-CM | POA: Diagnosis not present

## 2021-10-13 DIAGNOSIS — M1A9XX0 Chronic gout, unspecified, without tophus (tophi): Secondary | ICD-10-CM | POA: Diagnosis not present

## 2021-10-13 DIAGNOSIS — Z79899 Other long term (current) drug therapy: Secondary | ICD-10-CM | POA: Diagnosis not present

## 2021-10-13 DIAGNOSIS — R062 Wheezing: Secondary | ICD-10-CM | POA: Diagnosis not present

## 2021-10-13 DIAGNOSIS — Z885 Allergy status to narcotic agent status: Secondary | ICD-10-CM | POA: Diagnosis not present

## 2021-10-13 DIAGNOSIS — R0989 Other specified symptoms and signs involving the circulatory and respiratory systems: Secondary | ICD-10-CM | POA: Diagnosis not present

## 2021-10-13 DIAGNOSIS — R634 Abnormal weight loss: Secondary | ICD-10-CM | POA: Diagnosis not present

## 2021-10-13 DIAGNOSIS — R110 Nausea: Secondary | ICD-10-CM | POA: Diagnosis not present

## 2021-10-14 DIAGNOSIS — R059 Cough, unspecified: Secondary | ICD-10-CM | POA: Diagnosis not present

## 2021-10-14 DIAGNOSIS — J309 Allergic rhinitis, unspecified: Secondary | ICD-10-CM | POA: Diagnosis not present

## 2021-10-14 DIAGNOSIS — J209 Acute bronchitis, unspecified: Secondary | ICD-10-CM | POA: Diagnosis not present

## 2021-10-14 DIAGNOSIS — J45909 Unspecified asthma, uncomplicated: Secondary | ICD-10-CM | POA: Diagnosis not present

## 2021-10-14 DIAGNOSIS — R0602 Shortness of breath: Secondary | ICD-10-CM | POA: Diagnosis not present

## 2021-10-14 DIAGNOSIS — Z79899 Other long term (current) drug therapy: Secondary | ICD-10-CM | POA: Diagnosis not present

## 2021-10-21 DIAGNOSIS — B451 Cerebral cryptococcosis: Secondary | ICD-10-CM | POA: Diagnosis not present

## 2021-10-21 DIAGNOSIS — Z885 Allergy status to narcotic agent status: Secondary | ICD-10-CM | POA: Diagnosis not present

## 2021-10-21 DIAGNOSIS — Z79899 Other long term (current) drug therapy: Secondary | ICD-10-CM | POA: Diagnosis not present

## 2021-10-24 ENCOUNTER — Encounter: Payer: Self-pay | Admitting: Gastroenterology

## 2021-10-24 ENCOUNTER — Other Ambulatory Visit: Payer: Self-pay

## 2021-10-24 ENCOUNTER — Ambulatory Visit (INDEPENDENT_AMBULATORY_CARE_PROVIDER_SITE_OTHER): Payer: PPO | Admitting: Gastroenterology

## 2021-10-24 VITALS — BP 112/70 | HR 94 | Ht 70.5 in | Wt 164.0 lb

## 2021-10-24 DIAGNOSIS — K219 Gastro-esophageal reflux disease without esophagitis: Secondary | ICD-10-CM

## 2021-10-24 DIAGNOSIS — R1310 Dysphagia, unspecified: Secondary | ICD-10-CM

## 2021-10-24 MED ORDER — DEXILANT 60 MG PO CPDR: 60.0000 mg | DELAYED_RELEASE_CAPSULE | Freq: Every day | ORAL | 4 refills | Status: DC
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
## 2021-10-24 NOTE — Patient Instructions (Addendum)
If you are age 76 or older, your body mass index should be between 23-30. Your Body mass index is 23.2 kg/m. If this is out of the aforementioned range listed, please consider follow up with your Primary Care Provider.  If you are age 19 or younger, your body mass index should be between 19-25. Your Body mass index is 23.2 kg/m. If this is out of the aformentioned range listed, please consider follow up with your Primary Care Provider.   ________________________________________________________  The [facility] providers would like to encourage you to use [facility] to communicate with providers for non-urgent requests or questions.  Due to long hold times on the telephone, sending your provider a message by [facility] may be a faster and more efficient way to get a response.  Please allow 48 business hours for a response.  Please remember that this is for non-urgent requests.  _______________________________________________________  We have sent the following medications to your pharmacy for you to pick up at your convenience: [place] have been scheduled for a Barium Esophogram at [facility] (1st floor of the hospital) on 11-17-2021 at 930am. Please arrive 30 minutes prior to your appointment for registration. Make certain not to have anything to eat or drink after m prior to your test. If you need to reschedule for any reason, please contact radiology at [phone] to do so. __________________________________________________________________ A barium swallow is an examination that concentrates on views of the esophagus. This tends to be a double contrast exam (barium and two liquids which, when combined, create a gas to distend the wall of the oesophagus) or single contrast (non-ionic iodine based). The study is usually tailored to your symptoms so a good history is essential. Attention is paid during the study to the form, structure and configuration of the esophagus, looking for  functional disorders (such as aspiration, dysphagia, achalasia, motility and reflux) EXAMINATION You may be asked to change into a gown, depending on the type of swallow being performed. A radiologist and radiographer will perform the procedure. The radiologist will advise you of the type of contrast selected for your procedure and direct you during the exam. You will be asked to stand, sit or lie in several different positions and to hold a small amount of fluid in your mouth before being asked to swallow while the imaging is performed .In some instances you may be asked to swallow barium coated marshmallows to assess the motility of a solid food bolus. The exam can be recorded as a digital or video fluoroscopy procedure. POST PROCEDURE It will take 1-2 days for the barium to pass through your system. To facilitate this, it is important, unless otherwise directed, to increase your fluids for the next 24-48hrs and to resume your normal diet.  This test typically takes about 30 minutes to perform. __________________________________________________________________________________  Thank you,  Dr. Jackquline Denmark

## 2021-11-17 ENCOUNTER — Inpatient Hospital Stay (HOSPITAL_COMMUNITY): Admission: RE | Admit: 2021-11-17 | Payer: PPO | Source: Ambulatory Visit

## 2021-11-19 NOTE — Progress Notes (Signed)
[place]  [address] [place],  [place]  [address] [phone]  Clinic Day:  11/25/2021  Referring physician: Helen Hashimoto., MD  This document serves as a record of services personally performed by Alieyah Spader Macarthur Critchley, MD. It was created on their behalf by [facility] E, a trained medical scribe. The creation of this record is based on the scribe's personal observations and the provider's statements to them.  HISTORY OF PRESENT ILLNESS:  The patient is a 76 y.o. 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  He comes in today to reassess his anemia.   Since his last visit, the patient has been doing okay.  He continues to take monthly B12 injections.  He has baseline fatigue, but denies having any overt forms of blood loss.  PHYSICAL EXAM:  Blood pressure 134/87, pulse (!) 106, temperature 98 F (36.7 C), resp. rate 18, height 5' 10.5" (1.791 m), weight 168 lb 6.4 oz (76.4 kg), SpO2 98 %. Wt Readings from Last 3 Encounters:  11/25/21 168 lb 6.4 oz (76.4 kg)  10/24/21 164 lb (74.4 kg)  08/25/21 166 lb (75.3 kg)   Body mass index is 23.82 kg/m. Performance status (ECOG): 2 - Symptomatic, <50% confined to bed Physical Exam Constitutional:      Appearance: Normal appearance. He is not ill-appearing.     Comments: He is in a wheelchair  HENT:     Mouth/Throat:     Mouth: Mucous membranes are moist.     Pharynx: Oropharynx is clear. No oropharyngeal exudate or posterior oropharyngeal erythema.  Cardiovascular:     Rate and Rhythm: Regular rhythm. Tachycardia present.     Heart sounds: No murmur heard.   No friction rub. No gallop.  Pulmonary:     Effort: Pulmonary effort is normal. No respiratory distress.     Breath sounds: Normal breath sounds. No wheezing, rhonchi or rales.  Abdominal:     General: Bowel sounds are normal. There is no distension.     Palpations:  Abdomen is soft. There is no mass.     Tenderness: There is no abdominal tenderness.  Musculoskeletal:        General: No swelling.     Right lower leg: No edema.     Left lower leg: No edema.  Lymphadenopathy:     Cervical: No cervical adenopathy.     Upper Body:     Right upper body: No supraclavicular or axillary adenopathy.     Left upper body: No supraclavicular or axillary adenopathy.     Lower Body: No right inguinal adenopathy. No left inguinal adenopathy.  Skin:    General: Skin is warm.     Coloration: Skin is not jaundiced.     Findings: No lesion or rash.  Neurological:     General: No focal deficit present.     Mental Status: He is alert and oriented to person, place, and time. Mental status is at baseline.  Psychiatric:        Mood and Affect: Mood normal.        Behavior: Behavior normal.        Thought Content: Thought content normal.   LABS:    CMP Latest Ref Rng & Units 11/25/2021 08/11/2021 04/24/2021  Glucose 70 - 99 mg/dL - - 125(H)  BUN 4 - 21 30(A) 26(A) 36(H)  Creatinine 0.6 - 1.3 1.5(A) 1.5(A) 1.80(H)  Sodium 137 -  147 140 141 136  Potassium 3.4 - 5.3 4.5 4.1 4.0  Chloride 99 - 108 115(A) 109(A) 102  CO2 13 - 22 21 25(A) -  Calcium 8.7 - 10.7 8.4(A) 8.4(A) -  Alkaline Phos 25 - 125 45 72 -  AST 14 - 40 36 42(A) -  ALT 10 - 40 30 37 -    ASSESSMENT & PLAN:  A 76 y.o. 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  I am pleased that his hemoglobin is above 10 today.  He knows to continue taking his monthly B12 injections to assure adequate fortification of his cobalamin stores.  Clinically, he appears to be doing okay.  As that is the case, I will see him back in 4 months for repeat clinical assessment.  The patient and his son understand all the plans discussed today and are in agreement with them.  I, Rita Ohara, am acting as scribe for Marice Potter, MD    I have reviewed this report as typed by the  medical scribe, and it is complete and accurate.  Rashidi Loh Macarthur Critchley, MD

## 2021-11-25 ENCOUNTER — Inpatient Hospital Stay: Payer: PPO | Attending: Oncology | Admitting: Oncology

## 2021-11-25 ENCOUNTER — Inpatient Hospital Stay: Payer: PPO

## 2021-11-25 ENCOUNTER — Telehealth: Payer: Self-pay | Admitting: Oncology

## 2021-11-25 ENCOUNTER — Other Ambulatory Visit: Payer: Self-pay

## 2021-11-25 ENCOUNTER — Other Ambulatory Visit: Payer: Self-pay | Admitting: Hematology and Oncology

## 2021-11-25 ENCOUNTER — Other Ambulatory Visit: Payer: Self-pay | Admitting: Oncology

## 2021-11-25 VITALS — BP 134/87 | HR 106 | Temp 98.0°F | Resp 18 | Ht 70.5 in | Wt 168.4 lb

## 2021-11-25 DIAGNOSIS — D631 Anemia in chronic kidney disease: Secondary | ICD-10-CM

## 2021-11-25 DIAGNOSIS — D649 Anemia, unspecified: Secondary | ICD-10-CM

## 2021-11-25 DIAGNOSIS — N289 Disorder of kidney and ureter, unspecified: Secondary | ICD-10-CM | POA: Diagnosis not present

## 2021-11-25 DIAGNOSIS — D539 Nutritional anemia, unspecified: Secondary | ICD-10-CM | POA: Insufficient documentation

## 2021-11-25 DIAGNOSIS — N189 Chronic kidney disease, unspecified: Secondary | ICD-10-CM | POA: Diagnosis not present

## 2021-11-25 LAB — IRON AND TIBC
Iron: 113 ug/dL (ref 45–182)
Saturation Ratios: 39 % (ref 17.9–39.5)
TIBC: 287 ug/dL (ref 250–450)
UIBC: 174 ug/dL

## 2021-11-25 LAB — BASIC METABOLIC PANEL
BUN: 30 — AB (ref 4–21)
CO2: 21 (ref 13–22)
Chloride: 115 — AB (ref 99–108)
Creatinine: 1.5 — AB (ref 0.6–1.3)
Glucose: 97
Potassium: 4.5 (ref 3.4–5.3)
Sodium: 140 (ref 137–147)

## 2021-11-25 LAB — CBC: RBC: 3.22 — AB (ref 3.87–5.11)

## 2021-11-25 LAB — CBC AND DIFFERENTIAL
HCT: 32 — AB (ref 41–53)
Hemoglobin: 10.1 — AB (ref 13.5–17.5)
Neutrophils Absolute: 6.09
Platelets: 154 (ref 150–399)
WBC: 7

## 2021-11-25 LAB — HEPATIC FUNCTION PANEL
ALT: 30 (ref 10–40)
AST: 36 (ref 14–40)
Alkaline Phosphatase: 45 (ref 25–125)
Bilirubin, Total: 0.8

## 2021-11-25 LAB — FOLATE: Folate: 12.2 ng/mL (ref >5.9)

## 2021-11-25 LAB — COMPREHENSIVE METABOLIC PANEL
Albumin: 3.3 — AB (ref 3.5–5.0)
Calcium: 8.4 — AB (ref 8.7–10.7)

## 2021-11-25 LAB — VITAMIN B12: Vitamin B-12: 693 pg/mL (ref 180–914)

## 2021-11-25 LAB — TSH: TSH: 1.369 u[IU]/mL (ref 0.350–4.500)

## 2021-11-25 NOTE — Telephone Encounter (Signed)
Per 1/17 los next appt scheduled and confirmed with patient °

## 2021-11-29 DIAGNOSIS — L03113 Cellulitis of right upper limb: Secondary | ICD-10-CM | POA: Diagnosis not present

## 2021-12-20 DIAGNOSIS — N401 Enlarged prostate with lower urinary tract symptoms: Secondary | ICD-10-CM | POA: Diagnosis not present

## 2021-12-20 DIAGNOSIS — J301 Allergic rhinitis due to pollen: Secondary | ICD-10-CM | POA: Diagnosis not present

## 2021-12-20 DIAGNOSIS — R300 Dysuria: Secondary | ICD-10-CM | POA: Diagnosis not present

## 2021-12-20 DIAGNOSIS — N138 Other obstructive and reflux uropathy: Secondary | ICD-10-CM | POA: Diagnosis not present

## 2021-12-22 DIAGNOSIS — I252 Old myocardial infarction: Secondary | ICD-10-CM | POA: Diagnosis not present

## 2021-12-22 DIAGNOSIS — Z955 Presence of coronary angioplasty implant and graft: Secondary | ICD-10-CM | POA: Diagnosis not present

## 2021-12-22 DIAGNOSIS — E785 Hyperlipidemia, unspecified: Secondary | ICD-10-CM | POA: Diagnosis not present

## 2021-12-22 DIAGNOSIS — I2510 Atherosclerotic heart disease of native coronary artery without angina pectoris: Secondary | ICD-10-CM | POA: Diagnosis not present

## 2021-12-22 DIAGNOSIS — M1A9XX0 Chronic gout, unspecified, without tophus (tophi): Secondary | ICD-10-CM | POA: Diagnosis not present

## 2021-12-22 DIAGNOSIS — I10 Essential (primary) hypertension: Secondary | ICD-10-CM | POA: Diagnosis not present

## 2021-12-22 DIAGNOSIS — M1A09X0 Idiopathic chronic gout, multiple sites, without tophus (tophi): Secondary | ICD-10-CM | POA: Diagnosis not present

## 2021-12-22 DIAGNOSIS — Z79899 Other long term (current) drug therapy: Secondary | ICD-10-CM | POA: Diagnosis not present

## 2021-12-24 DIAGNOSIS — I10 Essential (primary) hypertension: Secondary | ICD-10-CM | POA: Diagnosis not present

## 2021-12-24 DIAGNOSIS — I2510 Atherosclerotic heart disease of native coronary artery without angina pectoris: Secondary | ICD-10-CM | POA: Diagnosis not present

## 2021-12-24 DIAGNOSIS — D649 Anemia, unspecified: Secondary | ICD-10-CM | POA: Diagnosis not present

## 2021-12-24 DIAGNOSIS — I4891 Unspecified atrial fibrillation: Secondary | ICD-10-CM | POA: Diagnosis not present

## 2021-12-24 DIAGNOSIS — E785 Hyperlipidemia, unspecified: Secondary | ICD-10-CM | POA: Diagnosis not present

## 2021-12-30 DIAGNOSIS — N410 Acute prostatitis: Secondary | ICD-10-CM | POA: Diagnosis not present

## 2021-12-30 DIAGNOSIS — N138 Other obstructive and reflux uropathy: Secondary | ICD-10-CM | POA: Diagnosis not present

## 2021-12-30 DIAGNOSIS — N401 Enlarged prostate with lower urinary tract symptoms: Secondary | ICD-10-CM | POA: Diagnosis not present

## 2021-12-31 DIAGNOSIS — N183 Chronic kidney disease, stage 3 unspecified: Secondary | ICD-10-CM | POA: Diagnosis not present

## 2021-12-31 DIAGNOSIS — I129 Hypertensive chronic kidney disease with stage 1 through stage 4 chronic kidney disease, or unspecified chronic kidney disease: Secondary | ICD-10-CM | POA: Diagnosis not present

## 2021-12-31 DIAGNOSIS — M19042 Primary osteoarthritis, left hand: Secondary | ICD-10-CM | POA: Diagnosis not present

## 2021-12-31 DIAGNOSIS — I480 Paroxysmal atrial fibrillation: Secondary | ICD-10-CM | POA: Diagnosis not present

## 2021-12-31 DIAGNOSIS — M19041 Primary osteoarthritis, right hand: Secondary | ICD-10-CM | POA: Diagnosis not present

## 2021-12-31 DIAGNOSIS — G4700 Insomnia, unspecified: Secondary | ICD-10-CM | POA: Diagnosis not present

## 2021-12-31 DIAGNOSIS — D6869 Other thrombophilia: Secondary | ICD-10-CM | POA: Diagnosis not present

## 2021-12-31 DIAGNOSIS — K219 Gastro-esophageal reflux disease without esophagitis: Secondary | ICD-10-CM | POA: Diagnosis not present

## 2021-12-31 DIAGNOSIS — F419 Anxiety disorder, unspecified: Secondary | ICD-10-CM | POA: Diagnosis not present

## 2021-12-31 DIAGNOSIS — Z7901 Long term (current) use of anticoagulants: Secondary | ICD-10-CM | POA: Diagnosis not present

## 2022-01-05 DIAGNOSIS — N50811 Right testicular pain: Secondary | ICD-10-CM | POA: Diagnosis not present

## 2022-01-05 DIAGNOSIS — Z79899 Other long term (current) drug therapy: Secondary | ICD-10-CM | POA: Diagnosis not present

## 2022-01-05 DIAGNOSIS — Z6824 Body mass index (BMI) 24.0-24.9, adult: Secondary | ICD-10-CM | POA: Diagnosis not present

## 2022-01-06 DIAGNOSIS — Z885 Allergy status to narcotic agent status: Secondary | ICD-10-CM | POA: Diagnosis not present

## 2022-01-06 DIAGNOSIS — N390 Urinary tract infection, site not specified: Secondary | ICD-10-CM | POA: Diagnosis not present

## 2022-01-06 DIAGNOSIS — B451 Cerebral cryptococcosis: Secondary | ICD-10-CM | POA: Diagnosis not present

## 2022-01-06 DIAGNOSIS — Z9181 History of falling: Secondary | ICD-10-CM | POA: Diagnosis not present

## 2022-01-06 DIAGNOSIS — N50811 Right testicular pain: Secondary | ICD-10-CM | POA: Diagnosis not present

## 2022-01-06 DIAGNOSIS — Z79899 Other long term (current) drug therapy: Secondary | ICD-10-CM | POA: Diagnosis not present

## 2022-01-06 DIAGNOSIS — N433 Hydrocele, unspecified: Secondary | ICD-10-CM | POA: Diagnosis not present

## 2022-01-06 DIAGNOSIS — R8290 Unspecified abnormal findings in urine: Secondary | ICD-10-CM | POA: Diagnosis not present

## 2022-01-06 DIAGNOSIS — N453 Epididymo-orchitis: Secondary | ICD-10-CM | POA: Diagnosis not present

## 2022-01-06 DIAGNOSIS — N410 Acute prostatitis: Secondary | ICD-10-CM | POA: Diagnosis not present

## 2022-01-13 DIAGNOSIS — Z87438 Personal history of other diseases of male genital organs: Secondary | ICD-10-CM | POA: Diagnosis not present

## 2022-01-13 DIAGNOSIS — N3281 Overactive bladder: Secondary | ICD-10-CM | POA: Diagnosis not present

## 2022-01-13 DIAGNOSIS — N453 Epididymo-orchitis: Secondary | ICD-10-CM | POA: Diagnosis not present

## 2022-01-13 DIAGNOSIS — N401 Enlarged prostate with lower urinary tract symptoms: Secondary | ICD-10-CM | POA: Diagnosis not present

## 2022-01-13 DIAGNOSIS — N138 Other obstructive and reflux uropathy: Secondary | ICD-10-CM | POA: Diagnosis not present

## 2022-01-19 DIAGNOSIS — J302 Other seasonal allergic rhinitis: Secondary | ICD-10-CM | POA: Diagnosis not present

## 2022-01-19 DIAGNOSIS — Z6823 Body mass index (BMI) 23.0-23.9, adult: Secondary | ICD-10-CM | POA: Diagnosis not present

## 2022-02-03 DIAGNOSIS — Z9181 History of falling: Secondary | ICD-10-CM | POA: Diagnosis not present

## 2022-02-03 DIAGNOSIS — Z1331 Encounter for screening for depression: Secondary | ICD-10-CM | POA: Diagnosis not present

## 2022-02-03 DIAGNOSIS — Z0000 Encounter for general adult medical examination without abnormal findings: Secondary | ICD-10-CM | POA: Diagnosis not present

## 2022-02-03 DIAGNOSIS — E785 Hyperlipidemia, unspecified: Secondary | ICD-10-CM | POA: Diagnosis not present

## 2022-02-10 ENCOUNTER — Ambulatory Visit: Payer: PPO | Admitting: Gastroenterology

## 2022-02-23 DIAGNOSIS — I480 Paroxysmal atrial fibrillation: Secondary | ICD-10-CM | POA: Diagnosis not present

## 2022-02-23 DIAGNOSIS — Z7901 Long term (current) use of anticoagulants: Secondary | ICD-10-CM | POA: Diagnosis not present

## 2022-02-23 DIAGNOSIS — D6869 Other thrombophilia: Secondary | ICD-10-CM | POA: Diagnosis not present

## 2022-02-23 DIAGNOSIS — Z515 Encounter for palliative care: Secondary | ICD-10-CM | POA: Diagnosis not present

## 2022-02-23 DIAGNOSIS — I10 Essential (primary) hypertension: Secondary | ICD-10-CM | POA: Diagnosis not present

## 2022-03-10 DIAGNOSIS — N3281 Overactive bladder: Secondary | ICD-10-CM | POA: Diagnosis not present

## 2022-03-10 DIAGNOSIS — R351 Nocturia: Secondary | ICD-10-CM | POA: Diagnosis not present

## 2022-03-10 DIAGNOSIS — N138 Other obstructive and reflux uropathy: Secondary | ICD-10-CM | POA: Diagnosis not present

## 2022-03-10 DIAGNOSIS — Z87438 Personal history of other diseases of male genital organs: Secondary | ICD-10-CM | POA: Diagnosis not present

## 2022-03-10 DIAGNOSIS — N401 Enlarged prostate with lower urinary tract symptoms: Secondary | ICD-10-CM | POA: Diagnosis not present

## 2022-03-16 DIAGNOSIS — I491 Atrial premature depolarization: Secondary | ICD-10-CM | POA: Diagnosis not present

## 2022-03-16 DIAGNOSIS — E785 Hyperlipidemia, unspecified: Secondary | ICD-10-CM | POA: Diagnosis not present

## 2022-03-16 DIAGNOSIS — I252 Old myocardial infarction: Secondary | ICD-10-CM | POA: Diagnosis not present

## 2022-03-16 DIAGNOSIS — Z955 Presence of coronary angioplasty implant and graft: Secondary | ICD-10-CM | POA: Diagnosis not present

## 2022-03-16 DIAGNOSIS — R0789 Other chest pain: Secondary | ICD-10-CM | POA: Diagnosis not present

## 2022-03-16 DIAGNOSIS — I480 Paroxysmal atrial fibrillation: Secondary | ICD-10-CM | POA: Diagnosis not present

## 2022-03-16 DIAGNOSIS — R0602 Shortness of breath: Secondary | ICD-10-CM | POA: Diagnosis not present

## 2022-03-16 DIAGNOSIS — I2510 Atherosclerotic heart disease of native coronary artery without angina pectoris: Secondary | ICD-10-CM | POA: Diagnosis not present

## 2022-03-16 DIAGNOSIS — R609 Edema, unspecified: Secondary | ICD-10-CM | POA: Diagnosis not present

## 2022-03-16 DIAGNOSIS — I10 Essential (primary) hypertension: Secondary | ICD-10-CM | POA: Diagnosis not present

## 2022-03-16 DIAGNOSIS — Z7901 Long term (current) use of anticoagulants: Secondary | ICD-10-CM | POA: Diagnosis not present

## 2022-03-25 ENCOUNTER — Other Ambulatory Visit: Payer: PPO

## 2022-03-25 ENCOUNTER — Ambulatory Visit: Payer: PPO | Admitting: Oncology

## 2022-04-01 NOTE — Progress Notes (Incomplete)
Aaron Houston  [address] [place],  [place]  [address] [phone]  Clinic Day:  04/01/2022  Referring physician: Helen Hashimoto., MD  This document serves as a record of services personally performed by Skylarr Liz Macarthur Critchley, MD. It was created on their behalf by [facility] E, a trained medical scribe. The creation of this record is based on the scribe's personal observations and the provider's statements to them.  HISTORY OF PRESENT ILLNESS:  The patient is a 76 y.o. 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  He comes in today to reassess his anemia.   Since his last visit, the patient has been doing okay.  He continues to take monthly B12 injections.  He has baseline fatigue, but denies having any overt forms of blood loss.  PHYSICAL EXAM:  There were no vitals taken for this visit. Wt Readings from Last 3 Encounters:  11/25/21 168 lb 6.4 oz (76.4 kg)  10/24/21 164 lb (74.4 kg)  08/25/21 166 lb (75.3 kg)   There is no height or weight on file to calculate BMI. Performance status (ECOG): 2 - Symptomatic, <50% confined to bed Physical Exam Constitutional:      Appearance: Normal appearance. He is not ill-appearing.     Comments: He is in a wheelchair  HENT:     Mouth/Throat:     Mouth: Mucous membranes are moist.     Pharynx: Oropharynx is clear. No oropharyngeal exudate or posterior oropharyngeal erythema.  Cardiovascular:     Rate and Rhythm: Regular rhythm. Tachycardia present.     Heart sounds: No murmur heard.   No friction rub. No gallop.  Pulmonary:     Effort: Pulmonary effort is normal. No respiratory distress.     Breath sounds: Normal breath sounds. No wheezing, rhonchi or rales.  Abdominal:     General: Bowel sounds are normal. There is no distension.     Palpations: Abdomen is soft. There is no mass.     Tenderness: There is no abdominal tenderness.   Musculoskeletal:        General: No swelling.     Right lower leg: No edema.     Left lower leg: No edema.  Lymphadenopathy:     Cervical: No cervical adenopathy.     Upper Body:     Right upper body: No supraclavicular or axillary adenopathy.     Left upper body: No supraclavicular or axillary adenopathy.     Lower Body: No right inguinal adenopathy. No left inguinal adenopathy.  Skin:    General: Skin is warm.     Coloration: Skin is not jaundiced.     Findings: No lesion or rash.  Neurological:     General: No focal deficit present.     Mental Status: He is alert and oriented to person, place, and time. Mental status is at baseline.  Psychiatric:        Mood and Affect: Mood normal.        Behavior: Behavior normal.        Thought Content: Thought content normal.   LABS:       Latest Ref Rng & Units 11/25/2021   12:00 AM 08/11/2021   12:00 AM 04/24/2021    6:56 AM  CMP  Glucose 70 - 99 mg/dL   125    BUN 4 - 21 30      26  36    Creatinine 0.6 - 1.3 1.5      1.5      1.80    Sodium 137 - 147 140      141      136    Potassium 3.4 - 5.3 4.5      4.1      4.0    Chloride 99 - 108 115      109      102    CO2 13 - '22 21      25        '$ Calcium 8.7 - 10.7 8.4      8.4        Alkaline Phos 25 - 125 45      72        AST 14 - 40 36      42        ALT 10 - 40 30      37           This result is from an external source.     ASSESSMENT & PLAN:  A 76 y.o. 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  I am pleased that his hemoglobin is above 10 today.  He knows to continue taking his monthly B12 injections to assure adequate fortification of his cobalamin stores.  Clinically, he appears to be doing okay.  As that is the case, I will see him back in 4 months for repeat clinical assessment.  The patient and his son understand all the plans discussed today and are in agreement with them.  I, Rita Ohara, am acting as scribe for  Marice Potter, MD    I have reviewed this report as typed by the medical scribe, and it is complete and accurate.  Aaron Coello Macarthur Critchley, MD

## 2022-04-02 ENCOUNTER — Inpatient Hospital Stay: Payer: PPO | Admitting: Oncology

## 2022-04-02 ENCOUNTER — Inpatient Hospital Stay: Payer: PPO | Attending: Internal Medicine

## 2022-04-27 DIAGNOSIS — N390 Urinary tract infection, site not specified: Secondary | ICD-10-CM | POA: Diagnosis not present

## 2022-04-27 DIAGNOSIS — K5730 Diverticulosis of large intestine without perforation or abscess without bleeding: Secondary | ICD-10-CM | POA: Diagnosis not present

## 2022-04-27 DIAGNOSIS — R61 Generalized hyperhidrosis: Secondary | ICD-10-CM | POA: Diagnosis not present

## 2022-04-27 DIAGNOSIS — Z885 Allergy status to narcotic agent status: Secondary | ICD-10-CM | POA: Diagnosis not present

## 2022-04-27 DIAGNOSIS — I5022 Chronic systolic (congestive) heart failure: Secondary | ICD-10-CM | POA: Diagnosis not present

## 2022-04-27 DIAGNOSIS — R0902 Hypoxemia: Secondary | ICD-10-CM | POA: Diagnosis not present

## 2022-04-27 DIAGNOSIS — E876 Hypokalemia: Secondary | ICD-10-CM | POA: Diagnosis not present

## 2022-04-27 DIAGNOSIS — I2510 Atherosclerotic heart disease of native coronary artery without angina pectoris: Secondary | ICD-10-CM | POA: Diagnosis not present

## 2022-04-27 DIAGNOSIS — I130 Hypertensive heart and chronic kidney disease with heart failure and stage 1 through stage 4 chronic kidney disease, or unspecified chronic kidney disease: Secondary | ICD-10-CM | POA: Diagnosis not present

## 2022-04-27 DIAGNOSIS — I4891 Unspecified atrial fibrillation: Secondary | ICD-10-CM | POA: Diagnosis not present

## 2022-04-27 DIAGNOSIS — M109 Gout, unspecified: Secondary | ICD-10-CM | POA: Diagnosis not present

## 2022-04-27 DIAGNOSIS — Z7901 Long term (current) use of anticoagulants: Secondary | ICD-10-CM | POA: Diagnosis not present

## 2022-04-27 DIAGNOSIS — R1013 Epigastric pain: Secondary | ICD-10-CM | POA: Diagnosis not present

## 2022-04-27 DIAGNOSIS — Z7902 Long term (current) use of antithrombotics/antiplatelets: Secondary | ICD-10-CM | POA: Diagnosis not present

## 2022-04-27 DIAGNOSIS — N1831 Chronic kidney disease, stage 3a: Secondary | ICD-10-CM | POA: Diagnosis not present

## 2022-04-27 DIAGNOSIS — I480 Paroxysmal atrial fibrillation: Secondary | ICD-10-CM | POA: Diagnosis not present

## 2022-04-27 DIAGNOSIS — M1009 Idiopathic gout, multiple sites: Secondary | ICD-10-CM | POA: Diagnosis not present

## 2022-04-27 DIAGNOSIS — R112 Nausea with vomiting, unspecified: Secondary | ICD-10-CM | POA: Diagnosis not present

## 2022-04-27 DIAGNOSIS — Z79899 Other long term (current) drug therapy: Secondary | ICD-10-CM | POA: Diagnosis not present

## 2022-04-27 DIAGNOSIS — I252 Old myocardial infarction: Secondary | ICD-10-CM | POA: Diagnosis not present

## 2022-04-27 DIAGNOSIS — Z8661 Personal history of infections of the central nervous system: Secondary | ICD-10-CM | POA: Diagnosis not present

## 2022-04-27 DIAGNOSIS — R072 Precordial pain: Secondary | ICD-10-CM | POA: Diagnosis not present

## 2022-04-27 DIAGNOSIS — I5020 Unspecified systolic (congestive) heart failure: Secondary | ICD-10-CM | POA: Diagnosis not present

## 2022-04-27 DIAGNOSIS — Z87440 Personal history of urinary (tract) infections: Secondary | ICD-10-CM | POA: Diagnosis not present

## 2022-04-27 DIAGNOSIS — R079 Chest pain, unspecified: Secondary | ICD-10-CM | POA: Diagnosis not present

## 2022-04-27 DIAGNOSIS — R0789 Other chest pain: Secondary | ICD-10-CM | POA: Diagnosis not present

## 2022-04-27 DIAGNOSIS — N453 Epididymo-orchitis: Secondary | ICD-10-CM | POA: Diagnosis not present

## 2022-04-27 DIAGNOSIS — Z955 Presence of coronary angioplasty implant and graft: Secondary | ICD-10-CM | POA: Diagnosis not present

## 2022-04-27 DIAGNOSIS — I4892 Unspecified atrial flutter: Secondary | ICD-10-CM | POA: Diagnosis not present

## 2022-04-27 DIAGNOSIS — R000 Tachycardia, unspecified: Secondary | ICD-10-CM | POA: Diagnosis not present

## 2022-04-28 DIAGNOSIS — I5020 Unspecified systolic (congestive) heart failure: Secondary | ICD-10-CM | POA: Diagnosis not present

## 2022-04-28 DIAGNOSIS — M109 Gout, unspecified: Secondary | ICD-10-CM | POA: Diagnosis not present

## 2022-04-28 DIAGNOSIS — I4891 Unspecified atrial fibrillation: Secondary | ICD-10-CM | POA: Diagnosis not present

## 2022-04-28 DIAGNOSIS — N390 Urinary tract infection, site not specified: Secondary | ICD-10-CM | POA: Diagnosis not present

## 2022-04-28 DIAGNOSIS — R1013 Epigastric pain: Secondary | ICD-10-CM | POA: Diagnosis not present

## 2022-04-28 DIAGNOSIS — R0789 Other chest pain: Secondary | ICD-10-CM | POA: Diagnosis not present

## 2022-04-28 DIAGNOSIS — E876 Hypokalemia: Secondary | ICD-10-CM | POA: Diagnosis not present

## 2022-04-28 DIAGNOSIS — R072 Precordial pain: Secondary | ICD-10-CM | POA: Diagnosis not present

## 2022-04-29 DIAGNOSIS — R072 Precordial pain: Secondary | ICD-10-CM | POA: Diagnosis not present

## 2022-04-29 DIAGNOSIS — I4891 Unspecified atrial fibrillation: Secondary | ICD-10-CM | POA: Diagnosis not present

## 2022-04-30 DIAGNOSIS — I480 Paroxysmal atrial fibrillation: Secondary | ICD-10-CM | POA: Diagnosis not present

## 2022-04-30 DIAGNOSIS — N390 Urinary tract infection, site not specified: Secondary | ICD-10-CM | POA: Diagnosis not present

## 2022-04-30 DIAGNOSIS — N453 Epididymo-orchitis: Secondary | ICD-10-CM | POA: Diagnosis not present

## 2022-04-30 DIAGNOSIS — Z7901 Long term (current) use of anticoagulants: Secondary | ICD-10-CM | POA: Diagnosis not present

## 2022-04-30 DIAGNOSIS — D6869 Other thrombophilia: Secondary | ICD-10-CM | POA: Diagnosis not present

## 2022-05-03 DIAGNOSIS — I4891 Unspecified atrial fibrillation: Secondary | ICD-10-CM | POA: Diagnosis not present

## 2022-05-03 DIAGNOSIS — I491 Atrial premature depolarization: Secondary | ICD-10-CM | POA: Diagnosis not present

## 2022-05-03 DIAGNOSIS — I493 Ventricular premature depolarization: Secondary | ICD-10-CM | POA: Diagnosis not present

## 2022-05-05 ENCOUNTER — Emergency Department (HOSPITAL_COMMUNITY): Payer: PPO

## 2022-05-05 ENCOUNTER — Other Ambulatory Visit: Payer: Self-pay

## 2022-05-05 ENCOUNTER — Observation Stay (HOSPITAL_COMMUNITY)
Admission: EM | Admit: 2022-05-05 | Discharge: 2022-05-06 | Disposition: A | Discharge status: Home / Self Care | Payer: PPO | Attending: Internal Medicine | Admitting: Internal Medicine

## 2022-05-05 ENCOUNTER — Encounter (HOSPITAL_COMMUNITY): Payer: Self-pay

## 2022-05-05 DIAGNOSIS — Z85828 Personal history of other malignant neoplasm of skin: Secondary | ICD-10-CM | POA: Diagnosis not present

## 2022-05-05 DIAGNOSIS — I4891 Unspecified atrial fibrillation: Secondary | ICD-10-CM | POA: Diagnosis present

## 2022-05-05 DIAGNOSIS — R112 Nausea with vomiting, unspecified: Secondary | ICD-10-CM

## 2022-05-05 DIAGNOSIS — K219 Gastro-esophageal reflux disease without esophagitis: Principal | ICD-10-CM | POA: Insufficient documentation

## 2022-05-05 DIAGNOSIS — Z7902 Long term (current) use of antithrombotics/antiplatelets: Secondary | ICD-10-CM | POA: Diagnosis not present

## 2022-05-05 DIAGNOSIS — R531 Weakness: Secondary | ICD-10-CM | POA: Diagnosis not present

## 2022-05-05 DIAGNOSIS — Z7901 Long term (current) use of anticoagulants: Secondary | ICD-10-CM | POA: Diagnosis not present

## 2022-05-05 DIAGNOSIS — B451 Cerebral cryptococcosis: Secondary | ICD-10-CM

## 2022-05-05 DIAGNOSIS — I2510 Atherosclerotic heart disease of native coronary artery without angina pectoris: Secondary | ICD-10-CM | POA: Insufficient documentation

## 2022-05-05 DIAGNOSIS — Z79899 Other long term (current) drug therapy: Secondary | ICD-10-CM | POA: Diagnosis not present

## 2022-05-05 DIAGNOSIS — I509 Heart failure, unspecified: Secondary | ICD-10-CM | POA: Diagnosis not present

## 2022-05-05 DIAGNOSIS — R1310 Dysphagia, unspecified: Secondary | ICD-10-CM | POA: Insufficient documentation

## 2022-05-05 DIAGNOSIS — N1831 Chronic kidney disease, stage 3a: Secondary | ICD-10-CM

## 2022-05-05 DIAGNOSIS — I5022 Chronic systolic (congestive) heart failure: Secondary | ICD-10-CM | POA: Insufficient documentation

## 2022-05-05 DIAGNOSIS — I130 Hypertensive heart and chronic kidney disease with heart failure and stage 1 through stage 4 chronic kidney disease, or unspecified chronic kidney disease: Secondary | ICD-10-CM | POA: Diagnosis not present

## 2022-05-05 DIAGNOSIS — R109 Unspecified abdominal pain: Secondary | ICD-10-CM | POA: Diagnosis not present

## 2022-05-05 DIAGNOSIS — I110 Hypertensive heart disease with heart failure: Secondary | ICD-10-CM | POA: Diagnosis not present

## 2022-05-05 DIAGNOSIS — N183 Chronic kidney disease, stage 3 unspecified: Secondary | ICD-10-CM | POA: Insufficient documentation

## 2022-05-05 DIAGNOSIS — R1110 Vomiting, unspecified: Secondary | ICD-10-CM | POA: Diagnosis not present

## 2022-05-05 LAB — CBC
HCT: 37.2 % — ABNORMAL LOW (ref 39.0–52.0)
Hemoglobin: 11.5 g/dL — ABNORMAL LOW (ref 13.0–17.0)
MCH: 32 pg (ref 26.0–34.0)
MCHC: 30.9 g/dL (ref 30.0–36.0)
MCV: 103.6 fL — ABNORMAL HIGH (ref 80.0–100.0)
Platelets: 325 10*3/uL (ref 150–400)
RBC: 3.59 MIL/uL — ABNORMAL LOW (ref 4.22–5.81)
RDW: 14 % (ref 11.5–15.5)
WBC: 7.1 10*3/uL (ref 4.0–10.5)
nRBC: 0 % (ref 0.0–0.2)

## 2022-05-05 LAB — URINALYSIS, ROUTINE W REFLEX MICROSCOPIC
Bilirubin Urine: NEGATIVE
Glucose, UA: NEGATIVE mg/dL
Hgb urine dipstick: NEGATIVE
Ketones, ur: NEGATIVE mg/dL
Nitrite: NEGATIVE
Protein, ur: NEGATIVE mg/dL
Specific Gravity, Urine: 1.014 (ref 1.005–1.030)
pH: 5 (ref 5.0–8.0)

## 2022-05-05 LAB — COMPREHENSIVE METABOLIC PANEL WITH GFR
ALT: 12 U/L (ref 0–44)
AST: 26 U/L (ref 15–41)
Albumin: 3.2 g/dL — ABNORMAL LOW (ref 3.5–5.0)
Alkaline Phosphatase: 59 U/L (ref 38–126)
Anion gap: 9 (ref 5–15)
BUN: 13 mg/dL (ref 8–23)
CO2: 23 mmol/L (ref 22–32)
Calcium: 9.7 mg/dL (ref 8.9–10.3)
Chloride: 107 mmol/L (ref 98–111)
Creatinine, Ser: 1.52 mg/dL — ABNORMAL HIGH (ref 0.61–1.24)
GFR, Estimated: 47 mL/min — ABNORMAL LOW
Glucose, Bld: 108 mg/dL — ABNORMAL HIGH (ref 70–99)
Potassium: 4.4 mmol/L (ref 3.5–5.1)
Sodium: 139 mmol/L (ref 135–145)
Total Bilirubin: 0.9 mg/dL (ref 0.3–1.2)
Total Protein: 6.7 g/dL (ref 6.5–8.1)

## 2022-05-05 LAB — LIPASE, BLOOD: Lipase: 57 U/L — ABNORMAL HIGH (ref 11–51)

## 2022-05-05 MED ORDER — FAMOTIDINE 20 MG PO TABS: 20.0000 mg | ORAL_TABLET | Freq: Every day | ORAL | Status: DC | PRN

## 2022-05-05 MED ORDER — FERROUS SULFATE 325 (65 FE) MG PO TABS
325.0000 mg | ORAL_TABLET | Freq: Every day | ORAL | Status: DC
Start: 2022-05-05 — End: 2022-05-06

## 2022-05-05 MED ORDER — LORATADINE 10 MG PO TABS: 10.0000 mg | ORAL_TABLET | Freq: Every day | ORAL | Status: DC

## 2022-05-05 MED ORDER — PANTOPRAZOLE SODIUM 40 MG PO TBEC
40.0000 mg | DELAYED_RELEASE_TABLET | Freq: Two times a day (BID) | ORAL | Status: DC
  Administered 2022-05-06: 40 mg via ORAL
  Filled 2022-05-05: qty 1

## 2022-05-05 MED ORDER — METOCLOPRAMIDE HCL 5 MG/ML IJ SOLN
10.0000 mg | Freq: Once | INTRAMUSCULAR | Status: AC
  Administered 2022-05-05: 10 mg via INTRAVENOUS
  Filled 2022-05-05: qty 2

## 2022-05-05 MED ORDER — ONDANSETRON HCL 4 MG PO TABS: 4.0000 mg | ORAL_TABLET | Freq: Four times a day (QID) | ORAL | Status: DC | PRN

## 2022-05-05 MED ORDER — VORICONAZOLE 200 MG PO TABS
200.0000 mg | ORAL_TABLET | Freq: Every evening | ORAL | Status: DC
  Filled 2022-05-05: qty 1

## 2022-05-05 MED ORDER — ROSUVASTATIN CALCIUM 5 MG PO TABS: 10.0000 mg | ORAL_TABLET | Freq: Every evening | ORAL | Status: DC

## 2022-05-05 MED ORDER — FEBUXOSTAT 40 MG PO TABS
40.0000 mg | ORAL_TABLET | Freq: Every day | ORAL | Status: DC
  Administered 2022-05-06: 40 mg via ORAL
  Filled 2022-05-05: qty 1

## 2022-05-05 MED ORDER — MIRABEGRON ER 25 MG PO TB24
50.0000 mg | ORAL_TABLET | Freq: Every day | ORAL | Status: DC
  Administered 2022-05-06: 50 mg via ORAL
  Filled 2022-05-05: qty 2

## 2022-05-05 MED ORDER — AZELASTINE HCL 0.1 % NA SOLN: 1.0000 | Freq: Every day | NASAL | Status: DC | PRN

## 2022-05-05 MED ORDER — ONDANSETRON HCL 4 MG/2ML IJ SOLN: 4.0000 mg | Freq: Four times a day (QID) | INTRAMUSCULAR | Status: DC | PRN

## 2022-05-05 MED ORDER — ACETAMINOPHEN 325 MG PO TABS: 650.0000 mg | ORAL_TABLET | Freq: Four times a day (QID) | ORAL | Status: DC | PRN

## 2022-05-05 MED ORDER — ACETAMINOPHEN 650 MG RE SUPP: 650.0000 mg | Freq: Four times a day (QID) | RECTAL | Status: DC | PRN

## 2022-05-05 MED ORDER — METOPROLOL SUCCINATE ER 25 MG PO TB24
25.0000 mg | ORAL_TABLET | Freq: Every day | ORAL | Status: DC
  Administered 2022-05-06: 25 mg via ORAL
  Filled 2022-05-05: qty 1

## 2022-05-05 NOTE — ED Provider Notes (Signed)
[facility] EMERGENCY DEPARTMENT Provider Note   CSN: [id number] Arrival date & time: 05/05/22  1418     History  Chief Complaint  Patient presents with   Emesis    Aaron Houston is a 76 y.o. male.  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  Patient was discharged home after sinus conversion to his A-fib and change in some medications.  Patient reports he did go home with an antibiotic but reports that since being home for the last 1 week he has had ongoing nausea vomiting and diffuse abdominal pain.  Patient reports the abdominal pain has been going on for quite some time with intermittent nausea but it seems worse over the last 2 weeks.  He reports he is nauseated all the time but will have various bouts of emesis.  He reports that he will be able to eat sometimes and hold it down but other times he will sit and start eating and then vomited everything up and then he can return and start eating again.  His bowel movements have not significantly changed and he denies diarrhea or constipation.  He did have prior abdominal surgery 40 years ago after an accident and reports he has had his gallbladder removed but no other surgery since that time.  He was started on Protonix prior to discharge and reports he takes no nausea medication at home but did have Phenergan prescribed in the past unclear if he is still taking that.Marland Kitchen  He reports he still just feels poorly and they said he was going to be set up with a GI provider for further evaluation and never has received a call.  He has not had endoscopy or colonoscopy since all the symptoms started and takes no NSAIDs.  He is on Eliquis for the atrial fibrillation.  He denies any chest pain or new shortness of breath but has a history  of chronic shortness of breath.  He reports his wife had to come today because she had an a medical emergency and because he was still feeling unwell he decided to come too.  The history is provided by the patient and medical records.  Emesis      Home Medications Prior to Admission medications   Medication Sig Start Date End Date Taking? Authorizing Provider  acetaminophen (TYLENOL) 500 MG tablet Take 1,000 mg by mouth every 6 (six) hours as needed for mild pain or moderate pain.    [provider]  apixaban (ELIQUIS) 5 MG TABS tablet Take 5 mg by mouth 2 (two) times daily.    [provider]  cetirizine (ZYRTEC) 10 MG tablet Take 10 mg by mouth at bedtime.    [provider]  cyanocobalamin (,VITAMIN B-12,) 1000 MCG/ML injection Inject 1,000 mcg into the muscle See admin instructions. Daily for 7 days, then once a week for 4 weeks, then once a month 08/25/21   [provider]  DEXILANT 60 MG capsule Take 1 capsule (60 mg total) by mouth daily. 10/24/21   Lynann Bologna, MD  famotidine (PEPCID) 20 MG tablet Take 20 mg by mouth daily as needed for heartburn or indigestion. 07/15/21   [provider]  febuxostat (ULORIC) 40 MG tablet Take 40 mg by mouth daily. Ongoing 08/05/21 09/04/21  [provider]  ferrous sulfate 325 (  65 FE) MG tablet Take 325 mg by mouth at bedtime.    [provider]  metoprolol succinate (TOPROL-XL) 50 MG 24 hr tablet Take 25 mg by mouth in the morning. Take with or immediately following a meal.    [provider]  promethazine (PHENERGAN) 25 MG tablet Take 25 mg by mouth 3 (three) times daily as needed for nausea. 06/05/21   [provider]  rosuvastatin (CRESTOR) 10 MG tablet Take 10 mg by mouth every evening. 06/05/21   [provider]  tamsulosin (FLOMAX) 0.4 MG CAPS capsule Take 0.4 mg by mouth at bedtime. 04/15/21   [provider]  voriconazole (VFEND) 200 MG tablet Take  200 mg by mouth daily. Continue taking for 3 more months per family member 08/13/21   [provider]      Allergies    Codeine, Hydrocodone, and Oxycodone    Review of Systems   Review of Systems  Gastrointestinal:  Positive for vomiting.    Physical Exam Updated Vital Signs BP 123/84   Pulse 83   Temp 98.6 F (37 C) (Oral)   Resp (!) 22   Ht 5\' 10"  (1.778 m)   Wt 76.2 kg   SpO2 99%   BMI 24.11 kg/m  Physical Exam Vitals and nursing note reviewed.  Constitutional:      General: He is not in acute distress.    Appearance: He is well-developed.  HENT:     Head: Normocephalic and atraumatic.  Eyes:     Conjunctiva/sclera: Conjunctivae normal.     Pupils: Pupils are equal, round, and reactive to light.  Cardiovascular:     Rate and Rhythm: Normal rate and regular rhythm.     Pulses: Normal pulses.     Heart sounds: No murmur heard. Pulmonary:     Effort: Pulmonary effort is normal. No respiratory distress.     Breath sounds: Normal breath sounds. No wheezing or rales.  Abdominal:     General: There is no distension.     Palpations: Abdomen is soft.     Tenderness: There is abdominal tenderness. There is no guarding or rebound.     Comments: Mild diffuse tenderness  Musculoskeletal:        General: No tenderness. Normal range of motion.     Cervical back: Normal range of motion and neck supple.     Right lower leg: No edema.     Left lower leg: No edema.  Skin:    General: Skin is warm and dry.     Findings: No erythema or rash.  Neurological:     Mental Status: He is alert and oriented to person, place, and time. Mental status is at baseline.  Psychiatric:        Mood and Affect: Mood normal.        Behavior: Behavior normal.     ED Results / Procedures / Treatments   Labs (all labs ordered are listed, but only abnormal results are displayed) Labs Reviewed  LIPASE, BLOOD - Abnormal; Notable for the following components:      Result Value    Lipase 57 (*)    All other components within normal limits  COMPREHENSIVE METABOLIC PANEL - Abnormal; Notable for the following components:   Glucose, Bld 108 (*)    Creatinine, Ser 1.52 (*)    Albumin 3.2 (*)    GFR, Estimated 47 (*)    All other components within normal limits  CBC - Abnormal; Notable for  the following components:   RBC 3.59 (*)    Hemoglobin 11.5 (*)    HCT 37.2 (*)    MCV 103.6 (*)    All other components within normal limits  URINALYSIS, ROUTINE W REFLEX MICROSCOPIC - Abnormal; Notable for the following components:   APPearance HAZY (*)    Leukocytes,Ua TRACE (*)    Bacteria, UA RARE (*)    All other components within normal limits    EKG EKG Interpretation  Date/Time:  Tuesday May 05 2022 15:16:42 EDT Ventricular Rate:  83 PR Interval:  156 QRS Duration: 80 QT Interval:  374 QTC Calculation: 439 R Axis:   -20 Text Interpretation: Normal sinus rhythm Inferior infarct , age undetermined No significant change since last tracing When compared with ECG of 27-May-2013 08:24, PREVIOUS ECG IS PRESENT Confirmed by Gwyneth Sprout ([id number]) on 05/05/2022 5:17:19 PM  Radiology DG Abdomen 1 View  Result Date: 05/05/2022 CLINICAL DATA:  Abdominal pain, vomiting EXAM: ABDOMEN - 1 VIEW COMPARISON:  None Available. FINDINGS: Bowel gas pattern is nonspecific. No abnormal masses or calcifications are seen. Surgical clips are seen in the right upper quadrant. There is previous surgical fusion from L4-S1 levels. There is decrease in height of right side of upper lumbar spine which may be recent or old. IMPRESSION: Nonspecific bowel gas pattern. Electronically Signed   By: Ernie Avena M.D.   On: 05/05/2022 18:49   DG Chest Port 1 View  Result Date: 05/05/2022 CLINICAL DATA:  Vomiting EXAM: PORTABLE CHEST 1 VIEW COMPARISON:  08/29/2021 FINDINGS: The heart size and mediastinal contours are within normal limits. Aortic atherosclerosis. Both lungs are clear. The  visualized skeletal structures are unremarkable. IMPRESSION: No active disease. Electronically Signed   By: Jasmine Pang M.D.   On: 05/05/2022 18:44    Procedures Procedures    Medications Ordered in ED Medications  metoCLOPramide (REGLAN) injection 10 mg (10 mg Intravenous Given 05/05/22 1754)    ED Course/ Medical Decision Making/ A&P                           Medical Decision Making Amount and/or Complexity of Data Reviewed External Data Reviewed: notes. Labs: ordered. Decision-making details documented in ED Course. Radiology: ordered and independent interpretation performed. Decision-making details documented in ED Course.  Risk Prescription drug management. Decision regarding hospitalization.   Pt with multiple medical problems and comorbidities and presenting today with a complaint that caries a high risk for morbidity and mortality.  Presenting today with ongoing chronic symptoms of persistent nausea vomiting and abdominal pain.  Patient has had issues for at least the last 4 to 5 months.  Was just discharged 1 week ago but at that time was having A-fib RVR and hypotension.  He did have a CT of his abdomen pelvis done while he was at [facility] and at that time it showed no acute findings.  Patient does not complain of feeling like his food is getting soft and his of esophagus but reports he will start eating and becomes so nauseated he will vomit up food that looks just eaten but then can return and sometimes continue eating and will hold the food down.  He reports that today he has vomited 2 or 3 times and has had a lot of gagging.  He was able to eat some crushed pineapple this morning and hold it down.  He reports if anything he can hold down maybe 1 meal a day.  He has yet to follow-up with GI and denies having an endoscopy or colonoscopy.  He did did see Dr. Chales Abrahams in December for similar symptoms and at that time they were going to do a barium swallow and then possible EGD  but it does not appear that the patient received that testing.  I independently interpreted patient's EKG and labs today and lipase is minimally elevated at 57, CMP with stable creatinine of 1.52 from hospitalization last week otherwise normal, CBC is unchanged with normal white count and hemoglobin, UA with persistent trace leukocytes and 11-20 white cells with rare bacteria and patient is currently on antibiotic treatment.  EKG without acute findings today.  Low suspicion that patient's symptoms today are cardiac in nature.  On your the nature of patient's chronic abdominal pain but the recurrent vomiting is concerning for esophageal or gastric stricture causing his inability to eat and recurrent vomiting.  It was given antiemetic here.  He recently had a CT that showed no acute findings and seems that symptoms today are not significantly different than his symptoms over the last 4 to 5 months.  No peritoneal signs to suggest that patient needs another CT today.  We will do a plain image to ensure no evidence of obstruction.  However patient is passing gas and has been able to hold down food intermittently.  8:25 PM I have independently visualized and interpreted pt's images today. Chest and KUB without signs of obstruction.  8:25 PM Spoke with Dr. Adela Lank and given patient's worsening symptoms, EF of 30 to 35% and need for EGD he recommended that patient be admitted made n.p.o. after midnight and they would plan on doing EGD tomorrow given patient's symptoms and worsening symptoms.  Patient would have to be a in-hospital endoscopy and currently the wait is 3 to 4 months and did not feel that the patient should wait that long given his worsening symptoms, weakness and weight loss.  Admission was requested by the hospitalist who will admit the patient for further care.  GI will see in the morning.         Final Clinical Impression(s) / ED Diagnoses Final diagnoses:  Nausea and vomiting,  unspecified vomiting type  Weakness  Chronic heart failure, unspecified heart failure type [facility])  Atrial fibrillation, unspecified type [facility])    Rx / DC Orders ED Discharge Orders     None         Gwyneth Sprout, MD 05/05/22 2026

## 2022-05-05 NOTE — Assessment & Plan Note (Addendum)
EF 30-35% on echo at [facility] just this past month (admitted for new diagnosis of A.Fib at that time). 1. Hold home lasix (only takes 20mg  2x a week) 2. Cont home BB

## 2022-05-05 NOTE — Assessment & Plan Note (Signed)
Pt with progressively worsening dysphagia over past couple of months. Dr. Adela Lank consulted: wants medicine to admit patient, plans on EGD tomorrow, wait time as outpt would be ~4 months. 1. Admitting patient 2. NPO after MN 3. Hold eliquis

## 2022-05-06 ENCOUNTER — Encounter (HOSPITAL_COMMUNITY)
Admission: EM | Disposition: A | Discharge status: Home / Self Care | Payer: Self-pay | Source: Home / Self Care | Attending: Emergency Medicine

## 2022-05-06 ENCOUNTER — Observation Stay (HOSPITAL_COMMUNITY): Payer: PPO | Admitting: Certified Registered Nurse Anesthetist

## 2022-05-06 ENCOUNTER — Observation Stay (HOSPITAL_BASED_OUTPATIENT_CLINIC_OR_DEPARTMENT_OTHER): Payer: PPO | Admitting: Certified Registered Nurse Anesthetist

## 2022-05-06 ENCOUNTER — Encounter (HOSPITAL_COMMUNITY): Payer: Self-pay | Admitting: Internal Medicine

## 2022-05-06 DIAGNOSIS — R1310 Dysphagia, unspecified: Secondary | ICD-10-CM | POA: Diagnosis not present

## 2022-05-06 DIAGNOSIS — I130 Hypertensive heart and chronic kidney disease with heart failure and stage 1 through stage 4 chronic kidney disease, or unspecified chronic kidney disease: Secondary | ICD-10-CM | POA: Diagnosis not present

## 2022-05-06 DIAGNOSIS — I509 Heart failure, unspecified: Secondary | ICD-10-CM | POA: Diagnosis not present

## 2022-05-06 DIAGNOSIS — I110 Hypertensive heart disease with heart failure: Secondary | ICD-10-CM

## 2022-05-06 DIAGNOSIS — Z7901 Long term (current) use of anticoagulants: Secondary | ICD-10-CM

## 2022-05-06 DIAGNOSIS — R112 Nausea with vomiting, unspecified: Secondary | ICD-10-CM

## 2022-05-06 DIAGNOSIS — N189 Chronic kidney disease, unspecified: Secondary | ICD-10-CM | POA: Diagnosis not present

## 2022-05-06 DIAGNOSIS — K219 Gastro-esophageal reflux disease without esophagitis: Secondary | ICD-10-CM | POA: Diagnosis not present

## 2022-05-06 DIAGNOSIS — I2510 Atherosclerotic heart disease of native coronary artery without angina pectoris: Secondary | ICD-10-CM | POA: Diagnosis not present

## 2022-05-06 HISTORY — PX: ESOPHAGOGASTRODUODENOSCOPY (EGD) WITH PROPOFOL: SHX5813

## 2022-05-06 LAB — CBC
HCT: 31.6 % — ABNORMAL LOW (ref 39.0–52.0)
Hemoglobin: 10.2 g/dL — ABNORMAL LOW (ref 13.0–17.0)
MCH: 32.3 pg (ref 26.0–34.0)
MCHC: 32.3 g/dL (ref 30.0–36.0)
MCV: 100 fL (ref 80.0–100.0)
Platelets: 233 10*3/uL (ref 150–400)
RBC: 3.16 MIL/uL — ABNORMAL LOW (ref 4.22–5.81)
RDW: 14.2 % (ref 11.5–15.5)
WBC: 5.3 10*3/uL (ref 4.0–10.5)
nRBC: 0 % (ref 0.0–0.2)

## 2022-05-06 LAB — BASIC METABOLIC PANEL
Anion gap: 10 (ref 5–15)
BUN: 15 mg/dL (ref 8–23)
CO2: 22 mmol/L (ref 22–32)
Calcium: 9.2 mg/dL (ref 8.9–10.3)
Chloride: 108 mmol/L (ref 98–111)
Creatinine, Ser: 1.5 mg/dL — ABNORMAL HIGH (ref 0.61–1.24)
GFR, Estimated: 48 mL/min — ABNORMAL LOW (ref >60)
Glucose, Bld: 92 mg/dL (ref 70–99)
Potassium: 3.6 mmol/L (ref 3.5–5.1)
Sodium: 140 mmol/L (ref 135–145)

## 2022-05-06 SURGERY — ESOPHAGOGASTRODUODENOSCOPY (EGD) WITH PROPOFOL
Anesthesia: Monitor Anesthesia Care

## 2022-05-06 MED ORDER — SUCRALFATE 1 GM/10ML PO SUSP: 1.0000 g | Freq: Four times a day (QID) | ORAL | 1 refills | Status: AC

## 2022-05-06 MED ORDER — PROPOFOL 500 MG/50ML IV EMUL
INTRAVENOUS | Status: DC | PRN
  Administered 2022-05-06: 125 ug/kg/min via INTRAVENOUS

## 2022-05-06 MED ORDER — PROPOFOL 10 MG/ML IV BOLUS
INTRAVENOUS | Status: DC | PRN
  Administered 2022-05-06: 20 mg via INTRAVENOUS

## 2022-05-06 MED ORDER — SODIUM CHLORIDE 0.9 % IV SOLN
INTRAVENOUS | Status: AC | PRN
  Administered 2022-05-06: 500 mL via INTRAVENOUS

## 2022-05-06 MED ORDER — LIDOCAINE 2% (20 MG/ML) 5 ML SYRINGE
INTRAMUSCULAR | Status: DC | PRN
  Administered 2022-05-06: 100 mg via INTRAVENOUS

## 2022-05-06 MED ORDER — PHENYLEPHRINE 80 MCG/ML (10ML) SYRINGE FOR IV PUSH (FOR BLOOD PRESSURE SUPPORT)
PREFILLED_SYRINGE | INTRAVENOUS | Status: DC | PRN
  Administered 2022-05-06: 80 ug via INTRAVENOUS

## 2022-05-06 SURGICAL SUPPLY — 15 items

## 2022-05-06 NOTE — Plan of Care (Signed)

## 2022-05-06 NOTE — Transfer of Care (Signed)
Immediate Anesthesia Transfer of Care Note  Patient: Aaron Houston  Procedure(s) Performed: ESOPHAGOGASTRODUODENOSCOPY (EGD) WITH PROPOFOL  Patient Location: Endoscopy Unit  Anesthesia Type:MAC  Level of Consciousness: awake, alert  and oriented  Airway & Oxygen Therapy: Patient Spontanous Breathing and Patient connected to nasal cannula oxygen  Post-op Assessment: Report given to RN and Post -op Vital signs reviewed and stable  Post vital signs: Reviewed and stable  Last Vitals:  Vitals Value Taken Time  BP    Temp    Pulse    Resp    SpO2      Last Pain:  Vitals:   05/06/22 1110  TempSrc:   PainSc: 5       Patients Stated Pain Goal: 0 (85/63/14 9702)  Complications: No notable events documented.

## 2022-05-06 NOTE — TOC Transition Note (Signed)
Transition of Care [facility]) - CM/SW Discharge Note   Patient Details  Name: Aaron Houston MRN: [id number] Date of Birth: [birth year]  Transition of Care [facility] CM/SW Contact:  Pollie Friar, RN Phone Number: 05/06/2022, 1:46 PM   Clinical Narrative:    Pt is discharging home with self care. No needs per TOC.   Final next level of care: Home/Self Care Barriers to Discharge: No Barriers Identified   Patient Goals and CMS Choice        Discharge Placement                       Discharge Plan and Services                                     Social Determinants of Health (SDOH) Interventions     Readmission Risk Interventions     No data to display

## 2022-05-06 NOTE — Anesthesia Preprocedure Evaluation (Signed)
Anesthesia Evaluation  Patient identified by MRN, date of birth, ID band Patient awake    Reviewed: Allergy & Precautions, NPO status , Patient's Chart, lab work & pertinent test results, reviewed documented beta blocker date and time   History of Anesthesia Complications Negative for: history of anesthetic complications  Airway Mallampati: III  TM Distance: >3 FB Neck ROM: Full    Dental  (+) Dental Advisory Given, Edentulous Upper   Pulmonary neg pulmonary ROS,    Pulmonary exam normal        Cardiovascular hypertension, Pt. on home beta blockers and Pt. on medications pulmonary hypertension+ CAD, + Cardiac Stents, + Peripheral Vascular Disease and +CHF  + dysrhythmias Atrial Fibrillation  Rhythm:Regular Rate:Bradycardia   '22 TTE - Global hypokinesis--more pronounced in the inferior wall.  LV ejection fraction = 45-50%.  Trace AI, mild MR and TR.  Estimated right ventricular systolic pressure is 45 mmHg.  Mild pulmonary hypertension.      Neuro/Psych  Headaches, PSYCHIATRIC DISORDERS Anxiety    GI/Hepatic Neg liver ROS, hiatal hernia, GERD  Controlled and Medicated,  Endo/Other  negative endocrine ROS  Renal/GU CRFRenal disease     Musculoskeletal  (+) Arthritis , Osteoarthritis,    Abdominal   Peds  Hematology  On eliquis (last Monday) and plavix (today)     Anesthesia Other Findings Covid+ 03/31/21 See PAT note   Reproductive/Obstetrics                             Anesthesia Physical  Anesthesia Plan  ASA: 3  Anesthesia Plan: MAC   Post-op Pain Management: Minimal or no pain anticipated   Induction: Intravenous  PONV Risk Score and Plan: 1 and Propofol infusion and Treatment may vary due to age or medical condition  Airway Management Planned: Natural Airway and Simple Face Mask  Additional Equipment: None  Intra-op Plan:   Post-operative Plan:   Informed  Consent: I have reviewed the patients History and Physical, chart, labs and discussed the procedure including the risks, benefits and alternatives for the proposed anesthesia with the patient or authorized representative who has indicated his/her understanding and acceptance.       Plan Discussed with: CRNA and Anesthesiologist  Anesthesia Plan Comments:         Anesthesia Quick Evaluation

## 2022-05-06 NOTE — Anesthesia Postprocedure Evaluation (Signed)
Anesthesia Post Note  Patient: Aaron Houston  Procedure(s) Performed: ESOPHAGOGASTRODUODENOSCOPY (EGD) WITH PROPOFOL     Patient location during evaluation: PACU Anesthesia Type: MAC Level of consciousness: awake and alert Pain management: pain level controlled Vital Signs Assessment: post-procedure vital signs reviewed and stable Respiratory status: spontaneous breathing, nonlabored ventilation and respiratory function stable Cardiovascular status: blood pressure returned to baseline and stable Postop Assessment: no apparent nausea or vomiting Anesthetic complications: no   No notable events documented.  Last Vitals:  Vitals:   05/06/22 1235 05/06/22 1240  BP:  122/82  Pulse: 71 72  Resp: 19 17  Temp:    SpO2: 100% 100%    Last Pain:  Vitals:   05/06/22 1210  TempSrc: Temporal  PainSc: 0-No pain                 Lynda Rainwater

## 2022-05-06 NOTE — Progress Notes (Signed)
Patient discharged in a wheelchair with son. Patient stated another family member was on the 6th floor, and they were going to visit. Discharge instruction reviewed with patient and son. No further questions.
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
## 2022-05-06 NOTE — Anesthesia Procedure Notes (Signed)
Procedure Name: MAC Date/Time: 05/06/2022 12:01 PM  Performed by: Valda Favia, CRNAPre-anesthesia Checklist: Patient identified, Emergency Drugs available, Suction available, Patient being monitored and Timeout performed Oxygen Delivery Method: Nasal cannula Airway Equipment and Method: Bite block Dental Injury: Teeth and Oropharynx as per pre-operative assessment

## 2022-05-06 NOTE — Op Note (Signed)
[facility] Patient Name: Aaron Houston Procedure Date : 05/06/2022 MRN: [id number] Attending MD: Docia Chuck. Henrene Pastor , MD Date of Birth: April 09, 1946 CSN: [id number] Age: 76 Admit Type: Inpatient Procedure:                Upper GI endoscopy Indications:              Nausea with vomiting, postprandially. Has been                            going on for 1 year. 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 Providers:                Docia Chuck. Henrene Pastor, MD, Dulcy Fanny, Benetta Spar, Technician Referring MD:             [facility] Medicines:                Monitored Anesthesia Care Complications:            No immediate complications. Estimated Blood Loss:     Estimated blood loss: none. Procedure:                Pre-Anesthesia Assessment:                           - Prior to the procedure, a History and Physical                            was performed, and patient medications and                            allergies were reviewed. The patient's tolerance of                            previous anesthesia was also reviewed. The risks                            and benefits of the procedure and the sedation                            options and risks were discussed with the patient.                            All questions were answered, and informed consent                            was obtained. Prior Anticoagulants: The patient has                            taken Plavix and Eliquis (apixaban), last dose was  2 days prior to procedure. ASA Grade Assessment:                            III - A patient with severe systemic disease. After                            reviewing the risks and benefits, the patient was                            deemed in satisfactory condition to undergo the                            procedure.                           After obtaining informed consent, the  endoscope was                            passed under direct vision. Throughout the                            procedure, the patient's blood pressure, pulse, and                            oxygen saturations were monitored continuously. The                            [id number]-H190 ([id number]) Olympus endoscope was introduced                            through the mouth, and advanced to the second part                            of duodenum. NOTE: Post bulbar duodenal picture was                            obtained but did not capture. The upper GI                            endoscopy was accomplished without difficulty. The                            patient tolerated the procedure well. Scope In: Scope Out: Findings:      The esophagus was normal.      The stomach was normal save small hiatal hernia and nonspecific erythema.      The examined duodenum was normal.      The cardia and gastric fundus were normal on retroflexion. Impression:               1. Unremarkable EGD. No cause for nausea and                            vomiting found  2. GERD. On PPI                           3. Postprandial nausea and vomiting may be related                            to chronic medical illness, recent acute                            superimposed illness, medications, or central                            etiology postmeningitis. Recommendation:           1. Heart healthy diet                           2. Resume previous medications including Plavix and                            Eliquis.                           3. Consider taking Zofran 4 mg, 15 minutes before                            each meal                           4. Continue daily PPI for GERD                           5. The patient wants to establish GI follow-up with                            aspirin with Dr. Lyda Jester. He should follow-up                            with Dr. Lyda Jester.                            Discussed with patient. He was provided a copy of                            this report. GI will sign off. Procedure Code(s):        --- Professional ---                           [phone], Esophagogastroduodenoscopy, flexible,                            transoral; diagnostic, including collection of                            specimen(s) by brushing or washing, when performed                            (  separate procedure) Diagnosis Code(s):        --- Professional ---                           R11.2, Nausea with vomiting, unspecified CPT copyright 2019 American Medical Association. All rights reserved. The codes documented in this report are preliminary and upon coder review may  be revised to meet current compliance requirements. Docia Chuck. Henrene Pastor, MD 05/06/2022 12:07:21 PM This report has been signed electronically. Number of Addenda: 0

## 2022-05-06 NOTE — Progress Notes (Signed)
Patient off the unit for scheduled EGD.

## 2022-05-06 NOTE — Discharge Summary (Signed)
Physician Discharge Summary  Aaron Houston HKV:[id number] DOB: 05-26-1946 DOA: 05/05/2022  PCP: Aaron Houston., MD  Admit date: 05/05/2022 Discharge date: 05/06/2022  Admitted From: Home Disposition: Home  Recommendations for Outpatient Follow-up:  Follow up with PCP in 1-2 weeks Follow-up with GI as scheduled  Home Health: None Equipment/Devices: None  Discharge Condition: Stable CODE STATUS: Full Diet recommendation: Low-salt low-fat diet  Brief/Interim Summary: Aaron Houston is a 76 y.o. 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 Pt admitted to [facility] this past week for A.Fib RVR.  Pt converted to NSR, discharged home.   Patient initially admitted for nausea vomiting and abdominal pain with unremarkable labs, patient states this has been ongoing since December 2022 followed with outpatient GI but appears to be somewhat noncompliant with home remedy for presumed gastritis.  GI consulted for endoscopy given concern for stricture given patient's issues vomiting and dysphagia.  Endoscopy grossly unremarkable other than ongoing gastritis.  Continue PPI, added Carafate, continue outpatient follow-up advance diet as tolerated.  Patient tolerating p.o. without any discomfort or complaints today post endoscopy.  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 Not in acute exacerbation, continue Lasix, beta-blocker as scheduled   CKD (chronic kidney disease) stage 3, GFR 30-59 ml/min (HCC) Chronic, currently at baseline   A-fib ([place]) Continue Eliquis, beta-blocker  Discharge Instructions  Discharge Instructions     Discharge patient   Complete by: As directed    Discharge disposition: 01-Home or Self Care   Discharge patient date: 05/06/2022      Allergies as of 05/06/2022       Reactions   Codeine Nausea Only, Rash   Hydrocodone Nausea And Vomiting   Can tolerate if given with anti-nausea medicine    Oxycodone Nausea And Vomiting   Can tolerate better if taking anti nausea         Medication List     TAKE these medications    acetaminophen 500 MG tablet Commonly known as: TYLENOL Take 1,000 mg by mouth every 6 (six) hours as needed for mild pain or moderate pain.   azelastine 0.1 % nasal spray Commonly known as: ASTELIN Place 1 spray into both nostrils daily as needed for rhinitis.   cetirizine 10 MG tablet Commonly known as: ZYRTEC Take 10 mg by mouth at bedtime.   clopidogrel 75 MG tablet Commonly known as: PLAVIX Take 75 mg by mouth daily.   cyanocobalamin 1000 MCG/ML injection Commonly known as: (VITAMIN B-12) Inject 1,000 mcg into the muscle every 30 (thirty) days.   Eliquis 5 MG Tabs tablet Generic drug: apixaban Take 5 mg by mouth 2 (two) times daily.   famotidine 20 MG tablet Commonly known as: PEPCID Take 20 mg by mouth  daily as needed for heartburn or indigestion.   febuxostat 40 MG tablet Commonly known as: ULORIC Take 40 mg by mouth daily. Ongoing   ferrous sulfate 325 (65 FE) MG tablet Take 325 mg by mouth at bedtime.   furosemide 20 MG tablet Commonly known as: LASIX Take 20 mg by mouth See admin instructions.  Takes 20 mg twice a week for edema   metoprolol succinate 25 MG 24 hr tablet Commonly known as: TOPROL-XL Take 25 mg by mouth daily.   Myrbetriq 50 MG Tb24 tablet Generic drug: mirabegron ER Take 50 mg by mouth daily.   pantoprazole 40 MG tablet Commonly known as: PROTONIX Take 40 mg by mouth 2 (two) times daily.   promethazine 25 MG tablet Commonly known as: PHENERGAN Take 25 mg by mouth 3 (three) times daily as needed for nausea or vomiting.   rosuvastatin 10 MG tablet Commonly known as: CRESTOR Take 10 mg by mouth every evening.   sucralfate 1 GM/10ML suspension Commonly known as: Carafate Take 10 mLs (1 g total) by mouth 4 (four) times daily.   voriconazole 200 MG tablet Commonly known as: VFEND Take 200 mg by mouth every evening.        Allergies  Allergen Reactions   Codeine Nausea Only and Rash   Hydrocodone Nausea And Vomiting    Can tolerate if given with anti-nausea medicine    Oxycodone Nausea And Vomiting    Can tolerate better if taking anti nausea     Consultations: GI  Procedures/Studies: DG Abdomen 1 View  Result Date: 05/05/2022 CLINICAL DATA:  Abdominal pain, vomiting EXAM: ABDOMEN - 1 VIEW COMPARISON:  None Available. FINDINGS: Bowel gas pattern is nonspecific. No abnormal masses or calcifications are seen. Surgical clips are seen in the right upper quadrant. There is previous surgical fusion from L4-S1 levels. There is decrease in height of right side of upper lumbar spine which may be recent or old. IMPRESSION: Nonspecific bowel gas pattern. Electronically Signed   By: Elmer Picker M.D.   On: 05/05/2022 18:49   DG Chest Port 1 View  Result Date: 05/05/2022 CLINICAL DATA:  Vomiting EXAM: PORTABLE CHEST 1 VIEW COMPARISON:  08/29/2021 FINDINGS: The heart size and mediastinal contours are within normal limits. Aortic atherosclerosis. Both lungs are clear. The visualized skeletal structures are unremarkable. IMPRESSION: No active disease.  Electronically Signed   By: Donavan Foil M.D.   On: 05/05/2022 18:44     Subjective: No acute issues or events overnight   Discharge Exam: Vitals:   05/06/22 1235 05/06/22 1240  BP:  122/82  Pulse: 71 72  Resp: 19 17  Temp:    SpO2: 100% 100%   Vitals:   05/06/22 1225 05/06/22 1230 05/06/22 1235 05/06/22 1240  BP:  118/73  122/82  Pulse: 72 72 71 72  Resp: '19 13 19 17  '$ Temp:      TempSrc:      SpO2: 98% 98% 100% 100%  Weight:      Height:        General: Pt is alert, awake, not in acute distress Cardiovascular: RRR, S1/S2 +, no rubs, no gallops Respiratory: CTA bilaterally, no wheezing, no rhonchi Abdominal: Soft, NT, ND, bowel sounds + Extremities: no edema, no cyanosis    The results of significant diagnostics from this hospitalization (including imaging, microbiology, ancillary and laboratory) are listed below for reference.     Microbiology: No results found for this or any previous visit (from the past 240 hour(s)).  Labs: BNP (last 3 results) No results for input(s): "BNP" in the last 8760 hours. Basic Metabolic Panel: Recent Labs  Lab 05/05/22 1522 05/06/22 0315  NA 139 140  K 4.4 3.6  CL 107 108  CO2 23 22  GLUCOSE 108* 92  BUN 13 15  CREATININE 1.52* 1.50*  CALCIUM 9.7 9.2   Liver Function Tests: Recent Labs  Lab 05/05/22 1522  AST 26  ALT 12  ALKPHOS 59  BILITOT 0.9  PROT 6.7  ALBUMIN 3.2*   Recent Labs  Lab 05/05/22 1522  LIPASE 57*   No results for input(s): "AMMONIA" in the last 168 hours. CBC: Recent Labs  Lab 05/05/22 1522 05/06/22 0315  WBC 7.1 5.3  HGB 11.5* 10.2*  HCT 37.2* 31.6*  MCV 103.6* 100.0  PLT 325 233   Cardiac Enzymes: No results for input(s): "CKTOTAL", "CKMB", "CKMBINDEX", "TROPONINI" in the last 168 hours. BNP: Invalid input(s): "POCBNP" CBG: No results for input(s): "GLUCAP" in the last 168 hours. D-Dimer No results for input(s): "DDIMER" in the last 72 hours. Hgb A1c No results for  input(s): "HGBA1C" in the last 72 hours. Lipid Profile No results for input(s): "CHOL", "HDL", "LDLCALC", "TRIG", "CHOLHDL", "LDLDIRECT" in the last 72 hours. Thyroid function studies No results for input(s): "TSH", "T4TOTAL", "T3FREE", "THYROIDAB" in the last 72 hours.  Invalid input(s): "FREET3" Anemia work up No results for input(s): "VITAMINB12", "FOLATE", "FERRITIN", "TIBC", "IRON", "RETICCTPCT" in the last 72 hours. Urinalysis    Component Value Date/Time   COLORURINE YELLOW 05/05/2022 1418   APPEARANCEUR HAZY (A) 05/05/2022 1418   LABSPEC 1.014 05/05/2022 1418   PHURINE 5.0 05/05/2022 1418   GLUCOSEU NEGATIVE 05/05/2022 1418   HGBUR NEGATIVE 05/05/2022 1418   BILIRUBINUR NEGATIVE 05/05/2022 1418   KETONESUR NEGATIVE 05/05/2022 1418   PROTEINUR NEGATIVE 05/05/2022 1418   NITRITE NEGATIVE 05/05/2022 1418   LEUKOCYTESUR TRACE (A) 05/05/2022 1418   Sepsis Labs Recent Labs  Lab 05/05/22 1522 05/06/22 0315  WBC 7.1 5.3   Microbiology No results found for this or any previous visit (from the past 240 hour(s)).   Time coordinating discharge: Over 30 minutes  SIGNED:   Little Ishikawa, DO [facility] 05/06/2022, 3:14 PM Pager   If 7PM-7AM, please contact night-coverage [email or web address]

## 2022-05-18 DIAGNOSIS — Z885 Allergy status to narcotic agent status: Secondary | ICD-10-CM | POA: Diagnosis not present

## 2022-05-18 DIAGNOSIS — Z7952 Long term (current) use of systemic steroids: Secondary | ICD-10-CM | POA: Diagnosis not present

## 2022-05-18 DIAGNOSIS — M1A09X0 Idiopathic chronic gout, multiple sites, without tophus (tophi): Secondary | ICD-10-CM | POA: Diagnosis not present

## 2022-05-18 DIAGNOSIS — Z79899 Other long term (current) drug therapy: Secondary | ICD-10-CM | POA: Diagnosis not present

## 2022-05-18 DIAGNOSIS — M109 Gout, unspecified: Secondary | ICD-10-CM | POA: Diagnosis not present

## 2022-05-25 DIAGNOSIS — Z885 Allergy status to narcotic agent status: Secondary | ICD-10-CM | POA: Diagnosis not present

## 2022-05-25 DIAGNOSIS — B451 Cerebral cryptococcosis: Secondary | ICD-10-CM | POA: Diagnosis not present

## 2022-05-25 DIAGNOSIS — R197 Diarrhea, unspecified: Secondary | ICD-10-CM | POA: Diagnosis not present

## 2022-05-31 DIAGNOSIS — R110 Nausea: Secondary | ICD-10-CM | POA: Diagnosis not present

## 2022-05-31 DIAGNOSIS — R52 Pain, unspecified: Secondary | ICD-10-CM | POA: Diagnosis not present

## 2022-05-31 DIAGNOSIS — R1084 Generalized abdominal pain: Secondary | ICD-10-CM | POA: Diagnosis not present

## 2022-05-31 DIAGNOSIS — R109 Unspecified abdominal pain: Secondary | ICD-10-CM | POA: Diagnosis not present

## 2022-05-31 DIAGNOSIS — Z20822 Contact with and (suspected) exposure to covid-19: Secondary | ICD-10-CM | POA: Diagnosis not present

## 2022-05-31 DIAGNOSIS — E860 Dehydration: Secondary | ICD-10-CM | POA: Diagnosis not present

## 2022-05-31 DIAGNOSIS — R1111 Vomiting without nausea: Secondary | ICD-10-CM | POA: Diagnosis not present

## 2022-05-31 DIAGNOSIS — K760 Fatty (change of) liver, not elsewhere classified: Secondary | ICD-10-CM | POA: Diagnosis not present

## 2022-05-31 DIAGNOSIS — R112 Nausea with vomiting, unspecified: Secondary | ICD-10-CM | POA: Diagnosis not present

## 2022-06-02 ENCOUNTER — Ambulatory Visit: Payer: PPO | Admitting: Podiatrist

## 2022-06-04 DIAGNOSIS — R4182 Altered mental status, unspecified: Secondary | ICD-10-CM | POA: Diagnosis not present

## 2022-06-04 DIAGNOSIS — R410 Disorientation, unspecified: Secondary | ICD-10-CM | POA: Diagnosis not present

## 2022-06-04 DIAGNOSIS — R531 Weakness: Secondary | ICD-10-CM | POA: Diagnosis not present

## 2022-06-04 DIAGNOSIS — K8689 Other specified diseases of pancreas: Secondary | ICD-10-CM | POA: Diagnosis not present

## 2022-06-04 DIAGNOSIS — K5730 Diverticulosis of large intestine without perforation or abscess without bleeding: Secondary | ICD-10-CM | POA: Diagnosis not present

## 2022-06-04 DIAGNOSIS — I700 Atherosclerosis of aorta: Secondary | ICD-10-CM | POA: Diagnosis not present

## 2022-06-04 DIAGNOSIS — R112 Nausea with vomiting, unspecified: Secondary | ICD-10-CM | POA: Diagnosis not present

## 2022-06-04 DIAGNOSIS — D7389 Other diseases of spleen: Secondary | ICD-10-CM | POA: Diagnosis not present

## 2022-06-04 DIAGNOSIS — N261 Atrophy of kidney (terminal): Secondary | ICD-10-CM | POA: Diagnosis not present

## 2022-06-05 DIAGNOSIS — I252 Old myocardial infarction: Secondary | ICD-10-CM | POA: Diagnosis not present

## 2022-06-05 DIAGNOSIS — I2510 Atherosclerotic heart disease of native coronary artery without angina pectoris: Secondary | ICD-10-CM | POA: Diagnosis not present

## 2022-06-05 DIAGNOSIS — M6281 Muscle weakness (generalized): Secondary | ICD-10-CM | POA: Diagnosis not present

## 2022-06-05 DIAGNOSIS — I10 Essential (primary) hypertension: Secondary | ICD-10-CM | POA: Diagnosis not present

## 2022-06-05 DIAGNOSIS — K5730 Diverticulosis of large intestine without perforation or abscess without bleeding: Secondary | ICD-10-CM | POA: Diagnosis not present

## 2022-06-05 DIAGNOSIS — N261 Atrophy of kidney (terminal): Secondary | ICD-10-CM | POA: Diagnosis not present

## 2022-06-05 DIAGNOSIS — Z7902 Long term (current) use of antithrombotics/antiplatelets: Secondary | ICD-10-CM | POA: Diagnosis not present

## 2022-06-05 DIAGNOSIS — G44059 Short lasting unilateral neuralgiform headache with conjunctival injection and tearing (SUNCT), not intractable: Secondary | ICD-10-CM | POA: Diagnosis not present

## 2022-06-05 DIAGNOSIS — N179 Acute kidney failure, unspecified: Secondary | ICD-10-CM | POA: Diagnosis not present

## 2022-06-05 DIAGNOSIS — E860 Dehydration: Secondary | ICD-10-CM | POA: Diagnosis not present

## 2022-06-05 DIAGNOSIS — R279 Unspecified lack of coordination: Secondary | ICD-10-CM | POA: Diagnosis not present

## 2022-06-05 DIAGNOSIS — R410 Disorientation, unspecified: Secondary | ICD-10-CM | POA: Diagnosis not present

## 2022-06-05 DIAGNOSIS — R2689 Other abnormalities of gait and mobility: Secondary | ICD-10-CM | POA: Diagnosis not present

## 2022-06-05 DIAGNOSIS — G4452 New daily persistent headache (NDPH): Secondary | ICD-10-CM | POA: Diagnosis not present

## 2022-06-05 DIAGNOSIS — I482 Chronic atrial fibrillation, unspecified: Secondary | ICD-10-CM | POA: Diagnosis not present

## 2022-06-05 DIAGNOSIS — E785 Hyperlipidemia, unspecified: Secondary | ICD-10-CM | POA: Diagnosis not present

## 2022-06-05 DIAGNOSIS — Z7901 Long term (current) use of anticoagulants: Secondary | ICD-10-CM | POA: Diagnosis not present

## 2022-06-05 DIAGNOSIS — M1990 Unspecified osteoarthritis, unspecified site: Secondary | ICD-10-CM | POA: Diagnosis not present

## 2022-06-05 DIAGNOSIS — N1832 Chronic kidney disease, stage 3b: Secondary | ICD-10-CM | POA: Diagnosis not present

## 2022-06-05 DIAGNOSIS — I4891 Unspecified atrial fibrillation: Secondary | ICD-10-CM | POA: Diagnosis not present

## 2022-06-05 DIAGNOSIS — F32A Depression, unspecified: Secondary | ICD-10-CM | POA: Diagnosis not present

## 2022-06-05 DIAGNOSIS — G928 Other toxic encephalopathy: Secondary | ICD-10-CM | POA: Diagnosis not present

## 2022-06-05 DIAGNOSIS — R55 Syncope and collapse: Secondary | ICD-10-CM | POA: Diagnosis not present

## 2022-06-05 DIAGNOSIS — I129 Hypertensive chronic kidney disease with stage 1 through stage 4 chronic kidney disease, or unspecified chronic kidney disease: Secondary | ICD-10-CM | POA: Diagnosis not present

## 2022-06-05 DIAGNOSIS — K649 Unspecified hemorrhoids: Secondary | ICD-10-CM | POA: Diagnosis not present

## 2022-06-05 DIAGNOSIS — Z79899 Other long term (current) drug therapy: Secondary | ICD-10-CM | POA: Diagnosis not present

## 2022-06-05 DIAGNOSIS — D631 Anemia in chronic kidney disease: Secondary | ICD-10-CM | POA: Diagnosis not present

## 2022-06-05 DIAGNOSIS — N189 Chronic kidney disease, unspecified: Secondary | ICD-10-CM | POA: Diagnosis not present

## 2022-06-05 DIAGNOSIS — F419 Anxiety disorder, unspecified: Secondary | ICD-10-CM | POA: Diagnosis not present

## 2022-06-05 DIAGNOSIS — D7389 Other diseases of spleen: Secondary | ICD-10-CM | POA: Diagnosis not present

## 2022-06-05 DIAGNOSIS — Z8661 Personal history of infections of the central nervous system: Secondary | ICD-10-CM | POA: Diagnosis not present

## 2022-06-05 DIAGNOSIS — B451 Cerebral cryptococcosis: Secondary | ICD-10-CM | POA: Diagnosis not present

## 2022-06-05 DIAGNOSIS — R440 Auditory hallucinations: Secondary | ICD-10-CM | POA: Diagnosis not present

## 2022-06-05 DIAGNOSIS — G038 Meningitis due to other specified causes: Secondary | ICD-10-CM | POA: Diagnosis not present

## 2022-06-05 DIAGNOSIS — Z20822 Contact with and (suspected) exposure to covid-19: Secondary | ICD-10-CM | POA: Diagnosis not present

## 2022-06-05 DIAGNOSIS — R4182 Altered mental status, unspecified: Secondary | ICD-10-CM | POA: Diagnosis not present

## 2022-06-05 DIAGNOSIS — R251 Tremor, unspecified: Secondary | ICD-10-CM | POA: Diagnosis not present

## 2022-06-05 DIAGNOSIS — R41841 Cognitive communication deficit: Secondary | ICD-10-CM | POA: Diagnosis not present

## 2022-06-05 DIAGNOSIS — R112 Nausea with vomiting, unspecified: Secondary | ICD-10-CM | POA: Diagnosis not present

## 2022-06-05 DIAGNOSIS — I700 Atherosclerosis of aorta: Secondary | ICD-10-CM | POA: Diagnosis not present

## 2022-06-05 DIAGNOSIS — Z885 Allergy status to narcotic agent status: Secondary | ICD-10-CM | POA: Diagnosis not present

## 2022-06-05 DIAGNOSIS — E872 Acidosis, unspecified: Secondary | ICD-10-CM | POA: Diagnosis not present

## 2022-06-05 DIAGNOSIS — K8689 Other specified diseases of pancreas: Secondary | ICD-10-CM | POA: Diagnosis not present

## 2022-06-05 DIAGNOSIS — M109 Gout, unspecified: Secondary | ICD-10-CM | POA: Diagnosis not present

## 2022-06-05 DIAGNOSIS — Z87442 Personal history of urinary calculi: Secondary | ICD-10-CM | POA: Diagnosis not present

## 2022-06-05 DIAGNOSIS — R531 Weakness: Secondary | ICD-10-CM | POA: Diagnosis not present

## 2022-06-05 DIAGNOSIS — G039 Meningitis, unspecified: Secondary | ICD-10-CM | POA: Diagnosis not present

## 2022-06-06 DIAGNOSIS — G928 Other toxic encephalopathy: Secondary | ICD-10-CM | POA: Diagnosis not present

## 2022-06-06 DIAGNOSIS — E872 Acidosis, unspecified: Secondary | ICD-10-CM | POA: Diagnosis not present

## 2022-06-06 DIAGNOSIS — B451 Cerebral cryptococcosis: Secondary | ICD-10-CM | POA: Diagnosis not present

## 2022-06-07 DIAGNOSIS — G928 Other toxic encephalopathy: Secondary | ICD-10-CM | POA: Diagnosis not present

## 2022-06-07 DIAGNOSIS — B451 Cerebral cryptococcosis: Secondary | ICD-10-CM | POA: Diagnosis not present

## 2022-06-07 DIAGNOSIS — E872 Acidosis, unspecified: Secondary | ICD-10-CM | POA: Diagnosis not present

## 2022-06-08 DIAGNOSIS — G928 Other toxic encephalopathy: Secondary | ICD-10-CM | POA: Diagnosis not present

## 2022-06-08 DIAGNOSIS — B451 Cerebral cryptococcosis: Secondary | ICD-10-CM | POA: Diagnosis not present

## 2022-06-08 DIAGNOSIS — E872 Acidosis, unspecified: Secondary | ICD-10-CM | POA: Diagnosis not present

## 2022-06-09 DIAGNOSIS — B451 Cerebral cryptococcosis: Secondary | ICD-10-CM | POA: Diagnosis not present

## 2022-06-09 DIAGNOSIS — G928 Other toxic encephalopathy: Secondary | ICD-10-CM | POA: Diagnosis not present

## 2022-06-09 DIAGNOSIS — E872 Acidosis, unspecified: Secondary | ICD-10-CM | POA: Diagnosis not present

## 2022-06-10 DIAGNOSIS — E872 Acidosis, unspecified: Secondary | ICD-10-CM | POA: Diagnosis not present

## 2022-06-10 DIAGNOSIS — G928 Other toxic encephalopathy: Secondary | ICD-10-CM | POA: Diagnosis not present

## 2022-06-10 DIAGNOSIS — B451 Cerebral cryptococcosis: Secondary | ICD-10-CM | POA: Diagnosis not present

## 2022-06-11 DIAGNOSIS — E872 Acidosis, unspecified: Secondary | ICD-10-CM | POA: Diagnosis not present

## 2022-06-11 DIAGNOSIS — B451 Cerebral cryptococcosis: Secondary | ICD-10-CM | POA: Diagnosis not present

## 2022-06-11 DIAGNOSIS — G928 Other toxic encephalopathy: Secondary | ICD-10-CM | POA: Diagnosis not present

## 2022-06-12 DIAGNOSIS — G928 Other toxic encephalopathy: Secondary | ICD-10-CM | POA: Diagnosis not present

## 2022-06-12 DIAGNOSIS — B451 Cerebral cryptococcosis: Secondary | ICD-10-CM | POA: Diagnosis not present

## 2022-06-12 DIAGNOSIS — E872 Acidosis, unspecified: Secondary | ICD-10-CM | POA: Diagnosis not present

## 2022-06-13 DIAGNOSIS — G928 Other toxic encephalopathy: Secondary | ICD-10-CM | POA: Diagnosis not present

## 2022-06-13 DIAGNOSIS — E872 Acidosis, unspecified: Secondary | ICD-10-CM | POA: Diagnosis not present

## 2022-06-13 DIAGNOSIS — B451 Cerebral cryptococcosis: Secondary | ICD-10-CM | POA: Diagnosis not present

## 2022-06-14 DIAGNOSIS — E872 Acidosis, unspecified: Secondary | ICD-10-CM | POA: Diagnosis not present

## 2022-06-14 DIAGNOSIS — G928 Other toxic encephalopathy: Secondary | ICD-10-CM | POA: Diagnosis not present

## 2022-06-14 DIAGNOSIS — B451 Cerebral cryptococcosis: Secondary | ICD-10-CM | POA: Diagnosis not present

## 2022-06-15 DIAGNOSIS — B451 Cerebral cryptococcosis: Secondary | ICD-10-CM | POA: Diagnosis not present

## 2022-06-15 DIAGNOSIS — G928 Other toxic encephalopathy: Secondary | ICD-10-CM | POA: Diagnosis not present

## 2022-06-15 DIAGNOSIS — E872 Acidosis, unspecified: Secondary | ICD-10-CM | POA: Diagnosis not present

## 2022-06-16 ENCOUNTER — Ambulatory Visit: Payer: PPO | Admitting: Podiatrist

## 2022-06-16 DIAGNOSIS — G928 Other toxic encephalopathy: Secondary | ICD-10-CM | POA: Diagnosis not present

## 2022-06-16 DIAGNOSIS — E872 Acidosis, unspecified: Secondary | ICD-10-CM | POA: Diagnosis not present

## 2022-06-16 DIAGNOSIS — B451 Cerebral cryptococcosis: Secondary | ICD-10-CM | POA: Diagnosis not present

## 2022-06-17 DIAGNOSIS — E872 Acidosis, unspecified: Secondary | ICD-10-CM | POA: Diagnosis not present

## 2022-06-17 DIAGNOSIS — B451 Cerebral cryptococcosis: Secondary | ICD-10-CM | POA: Diagnosis not present

## 2022-06-17 DIAGNOSIS — G928 Other toxic encephalopathy: Secondary | ICD-10-CM | POA: Diagnosis not present

## 2022-06-18 DIAGNOSIS — E872 Acidosis, unspecified: Secondary | ICD-10-CM | POA: Diagnosis not present

## 2022-06-18 DIAGNOSIS — G928 Other toxic encephalopathy: Secondary | ICD-10-CM | POA: Diagnosis not present

## 2022-06-18 DIAGNOSIS — B451 Cerebral cryptococcosis: Secondary | ICD-10-CM | POA: Diagnosis not present

## 2022-06-19 DIAGNOSIS — E872 Acidosis, unspecified: Secondary | ICD-10-CM | POA: Diagnosis not present

## 2022-06-19 DIAGNOSIS — B451 Cerebral cryptococcosis: Secondary | ICD-10-CM | POA: Diagnosis not present

## 2022-06-19 DIAGNOSIS — G928 Other toxic encephalopathy: Secondary | ICD-10-CM | POA: Diagnosis not present

## 2022-06-20 DIAGNOSIS — B451 Cerebral cryptococcosis: Secondary | ICD-10-CM | POA: Diagnosis not present

## 2022-06-20 DIAGNOSIS — E872 Acidosis, unspecified: Secondary | ICD-10-CM | POA: Diagnosis not present

## 2022-06-20 DIAGNOSIS — G928 Other toxic encephalopathy: Secondary | ICD-10-CM | POA: Diagnosis not present

## 2022-06-21 DIAGNOSIS — E872 Acidosis, unspecified: Secondary | ICD-10-CM | POA: Diagnosis not present

## 2022-06-21 DIAGNOSIS — B451 Cerebral cryptococcosis: Secondary | ICD-10-CM | POA: Diagnosis not present

## 2022-06-21 DIAGNOSIS — G928 Other toxic encephalopathy: Secondary | ICD-10-CM | POA: Diagnosis not present

## 2022-06-22 DIAGNOSIS — B451 Cerebral cryptococcosis: Secondary | ICD-10-CM | POA: Diagnosis not present

## 2022-06-22 DIAGNOSIS — G039 Meningitis, unspecified: Secondary | ICD-10-CM | POA: Diagnosis not present

## 2022-06-22 DIAGNOSIS — E872 Acidosis, unspecified: Secondary | ICD-10-CM | POA: Diagnosis not present

## 2022-06-22 DIAGNOSIS — G928 Other toxic encephalopathy: Secondary | ICD-10-CM | POA: Diagnosis not present

## 2022-06-23 DIAGNOSIS — G928 Other toxic encephalopathy: Secondary | ICD-10-CM | POA: Diagnosis not present

## 2022-06-23 DIAGNOSIS — E872 Acidosis, unspecified: Secondary | ICD-10-CM | POA: Diagnosis not present

## 2022-06-23 DIAGNOSIS — B451 Cerebral cryptococcosis: Secondary | ICD-10-CM | POA: Diagnosis not present

## 2022-06-26 DIAGNOSIS — G928 Other toxic encephalopathy: Secondary | ICD-10-CM | POA: Diagnosis not present

## 2022-06-26 DIAGNOSIS — R2689 Other abnormalities of gait and mobility: Secondary | ICD-10-CM | POA: Diagnosis not present

## 2022-06-26 DIAGNOSIS — L821 Other seborrheic keratosis: Secondary | ICD-10-CM | POA: Diagnosis not present

## 2022-06-26 DIAGNOSIS — I10 Essential (primary) hypertension: Secondary | ICD-10-CM | POA: Diagnosis not present

## 2022-06-26 DIAGNOSIS — R279 Unspecified lack of coordination: Secondary | ICD-10-CM | POA: Diagnosis not present

## 2022-06-26 DIAGNOSIS — Z792 Long term (current) use of antibiotics: Secondary | ICD-10-CM | POA: Diagnosis not present

## 2022-06-26 DIAGNOSIS — R55 Syncope and collapse: Secondary | ICD-10-CM | POA: Diagnosis not present

## 2022-06-26 DIAGNOSIS — E785 Hyperlipidemia, unspecified: Secondary | ICD-10-CM | POA: Diagnosis not present

## 2022-06-26 DIAGNOSIS — G038 Meningitis due to other specified causes: Secondary | ICD-10-CM | POA: Diagnosis not present

## 2022-06-26 DIAGNOSIS — I252 Old myocardial infarction: Secondary | ICD-10-CM | POA: Diagnosis not present

## 2022-06-26 DIAGNOSIS — M6281 Muscle weakness (generalized): Secondary | ICD-10-CM | POA: Diagnosis not present

## 2022-06-26 DIAGNOSIS — R251 Tremor, unspecified: Secondary | ICD-10-CM | POA: Diagnosis not present

## 2022-06-26 DIAGNOSIS — B451 Cerebral cryptococcosis: Secondary | ICD-10-CM | POA: Diagnosis not present

## 2022-06-26 DIAGNOSIS — R7989 Other specified abnormal findings of blood chemistry: Secondary | ICD-10-CM | POA: Diagnosis not present

## 2022-06-26 DIAGNOSIS — I4891 Unspecified atrial fibrillation: Secondary | ICD-10-CM | POA: Diagnosis not present

## 2022-06-26 DIAGNOSIS — D485 Neoplasm of uncertain behavior of skin: Secondary | ICD-10-CM | POA: Diagnosis not present

## 2022-06-26 DIAGNOSIS — E872 Acidosis, unspecified: Secondary | ICD-10-CM | POA: Diagnosis not present

## 2022-06-26 DIAGNOSIS — N189 Chronic kidney disease, unspecified: Secondary | ICD-10-CM | POA: Diagnosis not present

## 2022-06-26 DIAGNOSIS — R262 Difficulty in walking, not elsewhere classified: Secondary | ICD-10-CM | POA: Diagnosis not present

## 2022-06-26 DIAGNOSIS — R41841 Cognitive communication deficit: Secondary | ICD-10-CM | POA: Diagnosis not present

## 2022-06-26 DIAGNOSIS — G44059 Short lasting unilateral neuralgiform headache with conjunctival injection and tearing (SUNCT), not intractable: Secondary | ICD-10-CM | POA: Diagnosis not present

## 2022-06-26 DIAGNOSIS — I2510 Atherosclerotic heart disease of native coronary artery without angina pectoris: Secondary | ICD-10-CM | POA: Diagnosis not present

## 2022-06-26 DIAGNOSIS — R112 Nausea with vomiting, unspecified: Secondary | ICD-10-CM | POA: Diagnosis not present

## 2022-06-26 DIAGNOSIS — M109 Gout, unspecified: Secondary | ICD-10-CM | POA: Diagnosis not present

## 2022-06-26 DIAGNOSIS — G4452 New daily persistent headache (NDPH): Secondary | ICD-10-CM | POA: Diagnosis not present

## 2022-06-30 DIAGNOSIS — I4891 Unspecified atrial fibrillation: Secondary | ICD-10-CM | POA: Diagnosis not present

## 2022-06-30 DIAGNOSIS — R262 Difficulty in walking, not elsewhere classified: Secondary | ICD-10-CM | POA: Diagnosis not present

## 2022-06-30 DIAGNOSIS — I2510 Atherosclerotic heart disease of native coronary artery without angina pectoris: Secondary | ICD-10-CM | POA: Diagnosis not present

## 2022-06-30 DIAGNOSIS — B451 Cerebral cryptococcosis: Secondary | ICD-10-CM | POA: Diagnosis not present

## 2022-07-09 DIAGNOSIS — L821 Other seborrheic keratosis: Secondary | ICD-10-CM | POA: Diagnosis not present

## 2022-07-09 DIAGNOSIS — D485 Neoplasm of uncertain behavior of skin: Secondary | ICD-10-CM | POA: Diagnosis not present

## 2022-07-27 DIAGNOSIS — Z792 Long term (current) use of antibiotics: Secondary | ICD-10-CM | POA: Diagnosis not present

## 2022-07-27 DIAGNOSIS — R7989 Other specified abnormal findings of blood chemistry: Secondary | ICD-10-CM | POA: Diagnosis not present

## 2022-07-27 DIAGNOSIS — B451 Cerebral cryptococcosis: Secondary | ICD-10-CM | POA: Diagnosis not present

## 2022-07-30 DIAGNOSIS — Z7952 Long term (current) use of systemic steroids: Secondary | ICD-10-CM | POA: Diagnosis not present

## 2022-07-30 DIAGNOSIS — K219 Gastro-esophageal reflux disease without esophagitis: Secondary | ICD-10-CM | POA: Diagnosis not present

## 2022-07-30 DIAGNOSIS — Z0389 Encounter for observation for other suspected diseases and conditions ruled out: Secondary | ICD-10-CM | POA: Diagnosis not present

## 2022-07-30 DIAGNOSIS — B451 Cerebral cryptococcosis: Secondary | ICD-10-CM | POA: Diagnosis not present

## 2022-07-30 DIAGNOSIS — I129 Hypertensive chronic kidney disease with stage 1 through stage 4 chronic kidney disease, or unspecified chronic kidney disease: Secondary | ICD-10-CM | POA: Diagnosis not present

## 2022-07-30 DIAGNOSIS — I4891 Unspecified atrial fibrillation: Secondary | ICD-10-CM | POA: Diagnosis not present

## 2022-07-30 DIAGNOSIS — M810 Age-related osteoporosis without current pathological fracture: Secondary | ICD-10-CM | POA: Diagnosis not present

## 2022-07-30 DIAGNOSIS — I2510 Atherosclerotic heart disease of native coronary artery without angina pectoris: Secondary | ICD-10-CM | POA: Diagnosis not present

## 2022-07-30 DIAGNOSIS — G4452 New daily persistent headache (NDPH): Secondary | ICD-10-CM | POA: Diagnosis not present

## 2022-07-30 DIAGNOSIS — Z7901 Long term (current) use of anticoagulants: Secondary | ICD-10-CM | POA: Diagnosis not present

## 2022-07-30 DIAGNOSIS — N183 Chronic kidney disease, stage 3 unspecified: Secondary | ICD-10-CM | POA: Diagnosis not present

## 2022-07-30 DIAGNOSIS — M109 Gout, unspecified: Secondary | ICD-10-CM | POA: Diagnosis not present

## 2022-07-30 DIAGNOSIS — Z792 Long term (current) use of antibiotics: Secondary | ICD-10-CM | POA: Diagnosis not present

## 2022-07-30 DIAGNOSIS — Z7902 Long term (current) use of antithrombotics/antiplatelets: Secondary | ICD-10-CM | POA: Diagnosis not present

## 2022-07-30 DIAGNOSIS — E785 Hyperlipidemia, unspecified: Secondary | ICD-10-CM | POA: Diagnosis not present

## 2022-07-30 DIAGNOSIS — I739 Peripheral vascular disease, unspecified: Secondary | ICD-10-CM | POA: Diagnosis not present

## 2022-07-30 DIAGNOSIS — D631 Anemia in chronic kidney disease: Secondary | ICD-10-CM | POA: Diagnosis not present

## 2022-07-30 DIAGNOSIS — G44059 Short lasting unilateral neuralgiform headache with conjunctival injection and tearing (SUNCT), not intractable: Secondary | ICD-10-CM | POA: Diagnosis not present

## 2022-07-30 DIAGNOSIS — I252 Old myocardial infarction: Secondary | ICD-10-CM | POA: Diagnosis not present

## 2022-08-04 DIAGNOSIS — K2950 Unspecified chronic gastritis without bleeding: Secondary | ICD-10-CM | POA: Diagnosis not present

## 2022-08-04 DIAGNOSIS — Z6823 Body mass index (BMI) 23.0-23.9, adult: Secondary | ICD-10-CM | POA: Diagnosis not present

## 2022-08-04 DIAGNOSIS — Z09 Encounter for follow-up examination after completed treatment for conditions other than malignant neoplasm: Secondary | ICD-10-CM | POA: Diagnosis not present

## 2022-08-04 DIAGNOSIS — I509 Heart failure, unspecified: Secondary | ICD-10-CM | POA: Diagnosis not present

## 2022-08-04 DIAGNOSIS — E538 Deficiency of other specified B group vitamins: Secondary | ICD-10-CM | POA: Diagnosis not present

## 2022-08-11 ENCOUNTER — Ambulatory Visit: Payer: PPO | Admitting: Podiatry

## 2022-08-11 DIAGNOSIS — I739 Peripheral vascular disease, unspecified: Secondary | ICD-10-CM | POA: Diagnosis not present

## 2022-08-11 DIAGNOSIS — L600 Ingrowing nail: Secondary | ICD-10-CM

## 2022-08-11 MED ORDER — CEPHALEXIN 500 MG PO CAPS: 500.0000 mg | ORAL_CAPSULE | Freq: Three times a day (TID) | ORAL | 0 refills | Status: AC

## 2022-08-11 NOTE — Progress Notes (Signed)
  Subjective:  Patient ID: Aaron Houston, male    DOB: [birth year],  MRN: [id number]  Chief Complaint  Patient presents with   Nail Problem    RFC NAIL TRIM    Ingrown Toenail    BILATERAL GREAT TOE INGROWN - SWOLLEN / BLEEDS WHEN ITS BUMPED / ON BLOOD THINNERS      76 y.o. male presents with the above complaint. History confirmed with patient.  Patient presents for pain at the bilateral great toe.  He says this has been going on for couple weeks to months.  He denies a history of diabetes but does report being on Eliquis for A-fib as well as Plavix.  Does have a history of prior coronary artery disease.  Denies any known peripheral arterial disease.  He reports that anytime he bumps either toe he has some bleeding that occurs.  He has never had them treated in the past.  He has had prior right foot surgery for gout.  Denies any smoking history.  Objective:  Physical Exam: warm, good capillary refill, nail exam Incurvation is present along the bilateral hallux nail bilateral border. There is localized edema with mild erythema and increase in warmth around the nail border. There is mild sanguinous drainage. There is no ascending cellulitis. No malodor. , no trophic changes or ulcerative lesions.  DP pulse is 2+ out of 4 on the right foot however the PT is nonpalpable.  Left foot nonpalpable DP and PT pulses delayed capillary refill to the right and left hallux. Left Foot: Ingrown nail to the left hallux with pain on palpation Right Foot: Ingrown nail to the right hallux with pain on palpation  No images are attached to the encounter.  Assessment:   1. Ingrown nail of great toe of right foot   2. Ingrown nail of great toe of left foot   3. PAD (peripheral artery disease) ([place])      Plan:  Patient was evaluated and treated and all questions answered.    Ingrown Nail, bilateral hallux bilateral border -I did gust that I would recommend an ingrown procedure to remove the hallux nail  borders that are causing him pain and some mild paronychia. -However the patient has diminished pulses bilaterally and therefore I am concerned about peripheral arterial disease.  I discussed that there is a risk of nonhealing of the removal site if he has significant peripheral arterial disease. -Wrote the order for arterial circulatory testing with ABIs bilaterally.  If there is significant concern with these results we will have to refer the patient to vascular surgery on an urgent basis. -Otherwise we will plan to proceed with the ingrown removal procedure at next visit -In the meantime I encouraged the patient to do Epsom salt soaks twice daily in warm water for 10 minutes as well as place antibiotic ointment including triple antibiotic ointment on the nail borders and wrap with Band-Aid. -Sent a prescription for cephalexin 500 mg 3 times daily for 7 days for mild paronychia bilateral great toe -Patient to follow up in 2 weeks for ingrown nail removal pending the results of the vascular testing.  Return in about 2 weeks (around 08/25/2022) for Follow up ingrown nail bilateral foot, PAD.         Everitt Amber, DPM [facility]

## 2022-08-13 DIAGNOSIS — D0462 Carcinoma in situ of skin of left upper limb, including shoulder: Secondary | ICD-10-CM | POA: Diagnosis not present

## 2022-08-14 DIAGNOSIS — R2689 Other abnormalities of gait and mobility: Secondary | ICD-10-CM | POA: Diagnosis not present

## 2022-08-18 DIAGNOSIS — R059 Cough, unspecified: Secondary | ICD-10-CM | POA: Diagnosis not present

## 2022-08-20 ENCOUNTER — Ambulatory Visit: Payer: PPO

## 2022-08-24 ENCOUNTER — Ambulatory Visit: Payer: Self-pay | Admitting: Licensed Clinical Social Worker

## 2022-08-24 NOTE — Patient Outreach (Signed)
  Care Coordination   Initial Visit Note   08/24/2022 Name: Aaron Houston MRN: [id number] DOB: 12/01/1945  Aaron Houston is a 76 y.o. year old male who sees Helen Hashimoto., MD for primary care. I spoke with  Duard Brady by phone today.  What matters to the patients health and wellness today?  Patient Declined     Goals Addressed               This Visit's Progress     Care Coordination Activities- Patient Declined (pt-stated)        Patient Declined needing services        SDOH assessments and interventions completed:  Yes     Care Coordination Interventions Activated:  Yes  Care Coordination Interventions:  Yes, provided   Follow up plan: No further intervention required.   Encounter Outcome:  Pt. Visit Completed

## 2022-08-24 NOTE — Patient Instructions (Signed)
Visit Information  Thank you for taking time to visit with me today. Please don't hesitate to contact me if I can be of assistance to you.   Following are the goals we discussed today:   Goals Addressed               This Visit's Progress     Care Coordination Activities- Patient Declined (pt-stated)        Patient Declined needing services          Patient verbalizes understanding of instructions and care plan provided today and agrees to view in [place]. Active MyChart status and patient understanding of how to access instructions and care plan via MyChart confirmed with patient.     No further follow up required: .  [name] [name],MSW, [place] Worker [facility] Care Management  [phone]

## 2022-08-24 NOTE — Patient Instructions (Signed)
Visit Information  Thank you for taking time to visit with me today. Please don't hesitate to contact me if I can be of assistance to you.       Milus Height, MSW, [place] Social Worker [facility] Care Management  [phone]

## 2022-08-24 NOTE — Patient Outreach (Signed)
  Care Coordination   Initial Visit Note   08/24/2022 Name: Donevan Biller MRN: [id number] DOB: 06-14-1946  Sameer Teeple is a 76 y.o. year old male who sees Helen Hashimoto., MD for primary care. I spoke with patients daughter on today. She advised me to speak with the brother who is the primary caretaker. SW ended call to contact brother.    SDOH assessments and interventions completed:  Yes     Care Coordination Interventions Activated:  Yes  Care Coordination Interventions:  Yes, provided   Follow up plan: No further intervention required.   Encounter Outcome:  Pt. Visit Completed

## 2022-08-29 DIAGNOSIS — D631 Anemia in chronic kidney disease: Secondary | ICD-10-CM | POA: Diagnosis not present

## 2022-08-29 DIAGNOSIS — I4891 Unspecified atrial fibrillation: Secondary | ICD-10-CM | POA: Diagnosis not present

## 2022-08-29 DIAGNOSIS — I252 Old myocardial infarction: Secondary | ICD-10-CM | POA: Diagnosis not present

## 2022-08-29 DIAGNOSIS — K219 Gastro-esophageal reflux disease without esophagitis: Secondary | ICD-10-CM | POA: Diagnosis not present

## 2022-08-29 DIAGNOSIS — G4452 New daily persistent headache (NDPH): Secondary | ICD-10-CM | POA: Diagnosis not present

## 2022-08-29 DIAGNOSIS — N183 Chronic kidney disease, stage 3 unspecified: Secondary | ICD-10-CM | POA: Diagnosis not present

## 2022-08-29 DIAGNOSIS — G44059 Short lasting unilateral neuralgiform headache with conjunctival injection and tearing (SUNCT), not intractable: Secondary | ICD-10-CM | POA: Diagnosis not present

## 2022-08-29 DIAGNOSIS — M810 Age-related osteoporosis without current pathological fracture: Secondary | ICD-10-CM | POA: Diagnosis not present

## 2022-08-29 DIAGNOSIS — Z7952 Long term (current) use of systemic steroids: Secondary | ICD-10-CM | POA: Diagnosis not present

## 2022-08-29 DIAGNOSIS — B451 Cerebral cryptococcosis: Secondary | ICD-10-CM | POA: Diagnosis not present

## 2022-08-29 DIAGNOSIS — M109 Gout, unspecified: Secondary | ICD-10-CM | POA: Diagnosis not present

## 2022-08-29 DIAGNOSIS — I129 Hypertensive chronic kidney disease with stage 1 through stage 4 chronic kidney disease, or unspecified chronic kidney disease: Secondary | ICD-10-CM | POA: Diagnosis not present

## 2022-08-29 DIAGNOSIS — I2510 Atherosclerotic heart disease of native coronary artery without angina pectoris: Secondary | ICD-10-CM | POA: Diagnosis not present

## 2022-08-29 DIAGNOSIS — I739 Peripheral vascular disease, unspecified: Secondary | ICD-10-CM | POA: Diagnosis not present

## 2022-08-29 DIAGNOSIS — Z7901 Long term (current) use of anticoagulants: Secondary | ICD-10-CM | POA: Diagnosis not present

## 2022-08-29 DIAGNOSIS — Z7902 Long term (current) use of antithrombotics/antiplatelets: Secondary | ICD-10-CM | POA: Diagnosis not present

## 2022-08-29 DIAGNOSIS — E785 Hyperlipidemia, unspecified: Secondary | ICD-10-CM | POA: Diagnosis not present

## 2022-08-31 ENCOUNTER — Ambulatory Visit: Payer: PPO

## 2022-09-03 DIAGNOSIS — Z23 Encounter for immunization: Secondary | ICD-10-CM | POA: Diagnosis not present

## 2022-09-14 ENCOUNTER — Other Ambulatory Visit: Payer: Self-pay | Admitting: Podiatry

## 2022-09-14 ENCOUNTER — Ambulatory Visit (INDEPENDENT_AMBULATORY_CARE_PROVIDER_SITE_OTHER): Payer: PPO

## 2022-09-14 ENCOUNTER — Ambulatory Visit: Payer: PPO | Attending: Cardiology

## 2022-09-14 DIAGNOSIS — I739 Peripheral vascular disease, unspecified: Secondary | ICD-10-CM | POA: Diagnosis not present

## 2022-09-15 IMAGING — CT CT HEAD W/O CM
3 series · 14 of 47 positions shown, 16 images · non-contrast
Comparison: None.

CLINICAL DATA: Trauma.  Status post fall.

EXAM:
CT HEAD WITHOUT CONTRAST
CT MAXILLOFACIAL WITHOUT CONTRAST
CT CERVICAL SPINE WITHOUT CONTRAST
TECHNIQUE: Multidetector CT imaging of the head, cervical spine, and
maxillofacial structures were performed using the standard protocol
without intravenous contrast. Multiplanar CT image reconstructions
of the cervical spine and maxillofacial structures were also
generated.

[Series 4: head wo · axial · 0.45mm/px · z∈[-76,+49]mm · 8 of 31 slices shown, 10 images]
[im 3/31  brain]
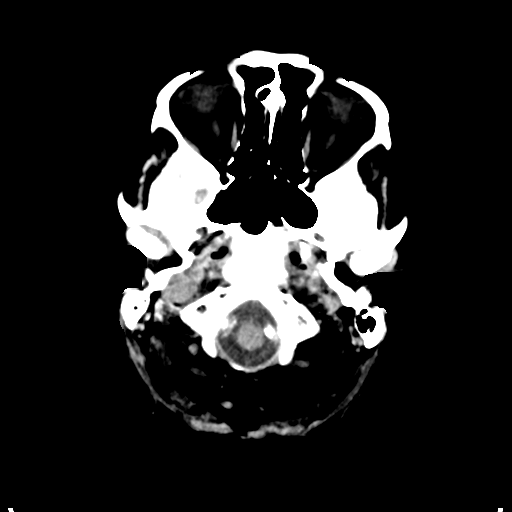
[im 3/31  bone]
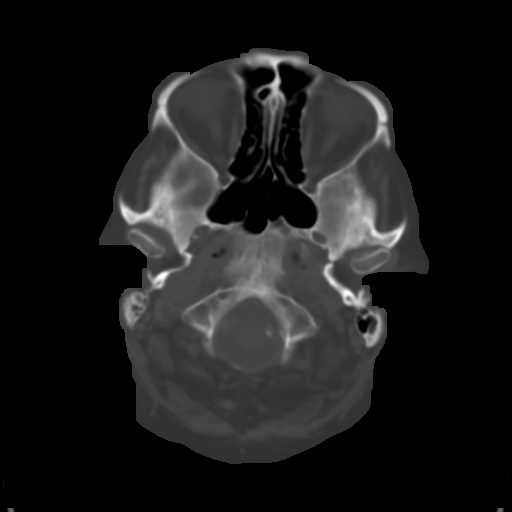
[im 7/31  brain]
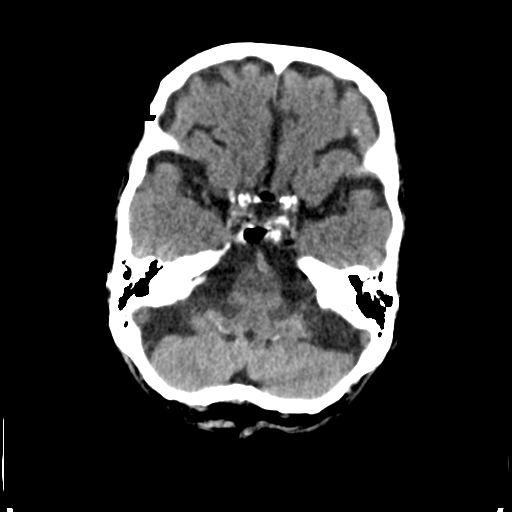
[im 10/31  brain]
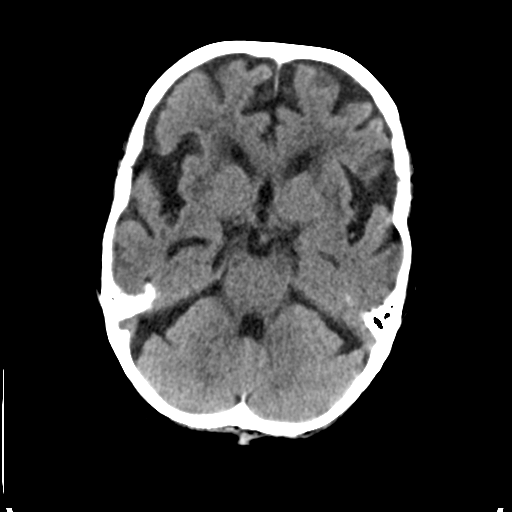
[im 14/31  brain]
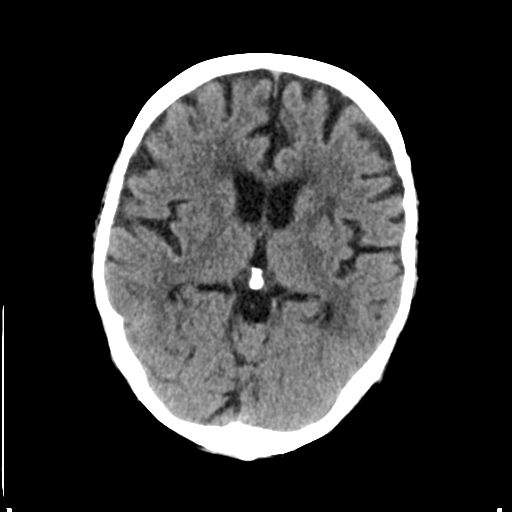
[im 17/31  brain]
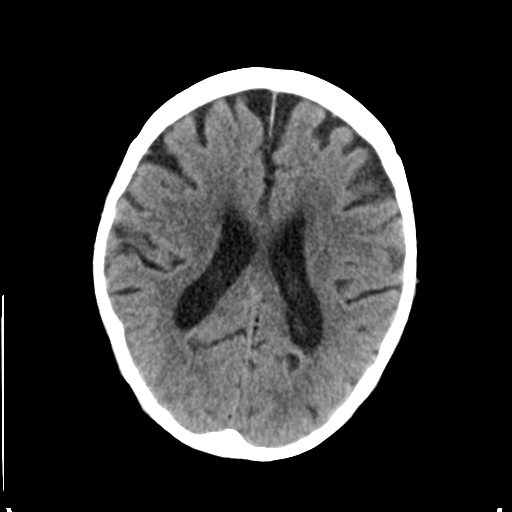
[im 17/31  bone]
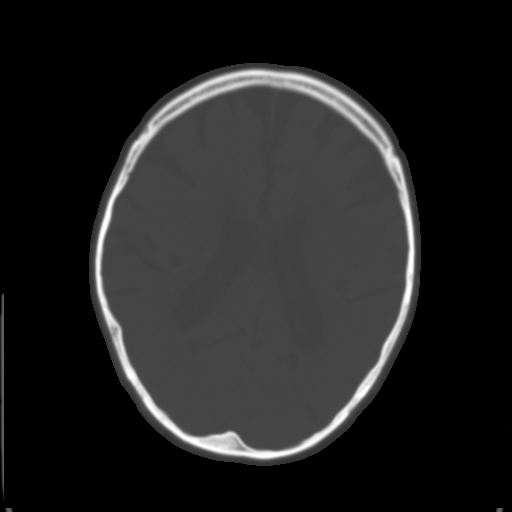
[im 21/31  brain]
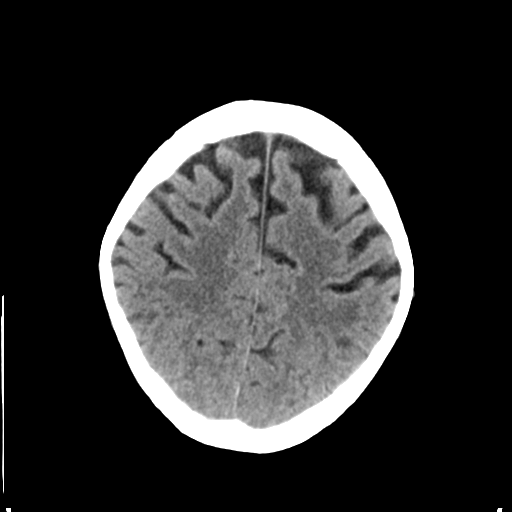
[im 24/31  brain]
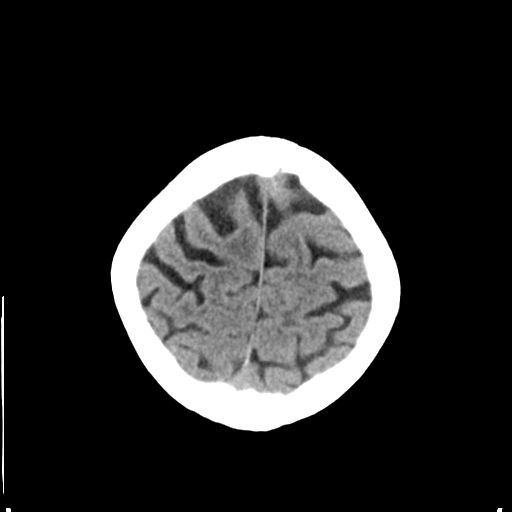
[im 28/31  brain]
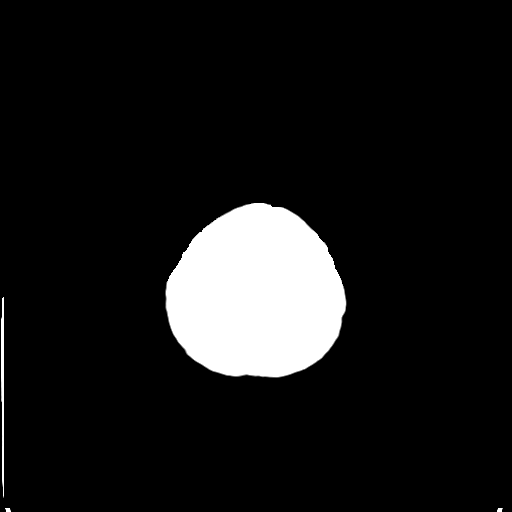

[Series 6: coronal soft tissue · coronal · 0.36mm/px · 3 of 70 slices shown]
[im 24/70  brain]
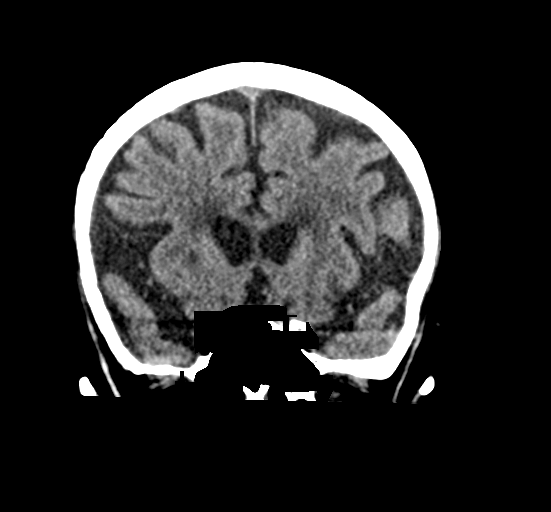
[im 31/70  brain]
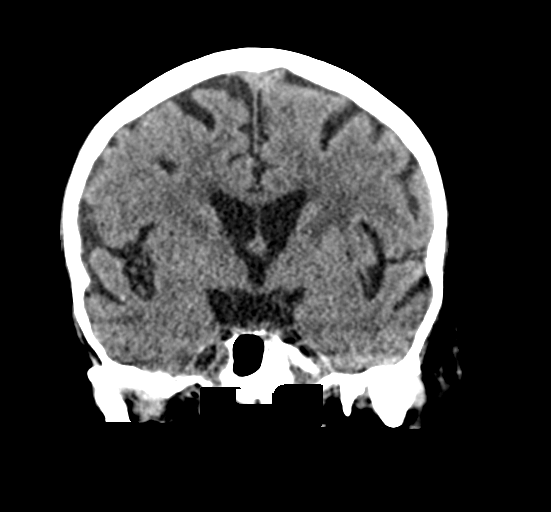
[im 39/70  brain]
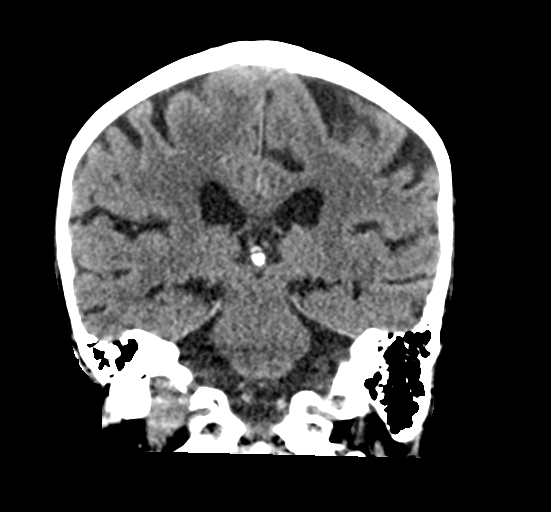

[Series 7: sagittal soft tissue · sagittal · 0.34mm/px · 3 of 58 slices shown]
[im 20/58  brain]
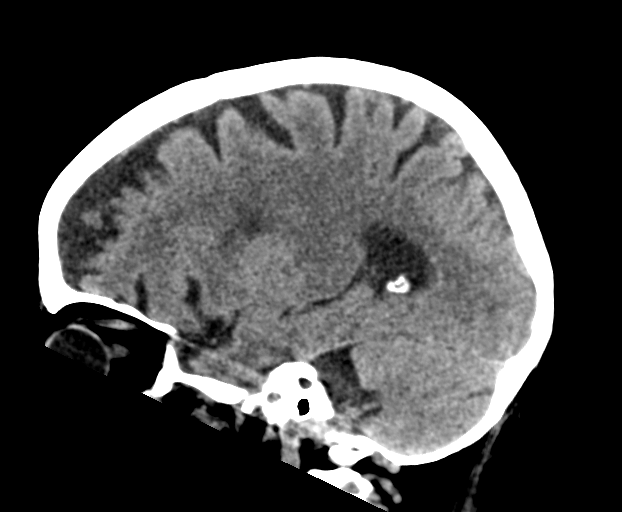
[im 29/58  brain]
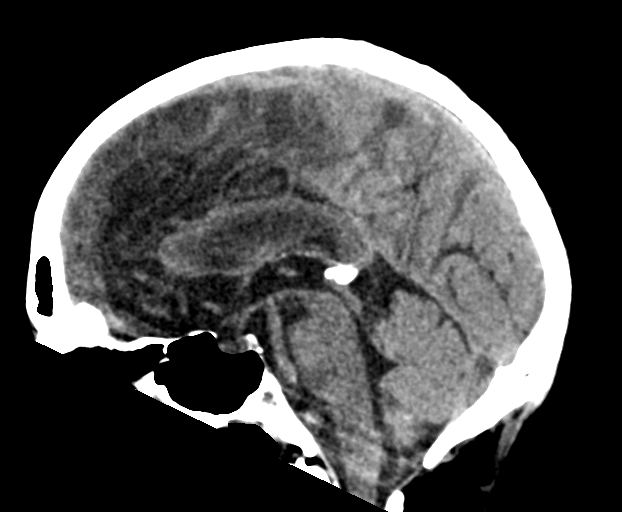
[im 39/58  brain]
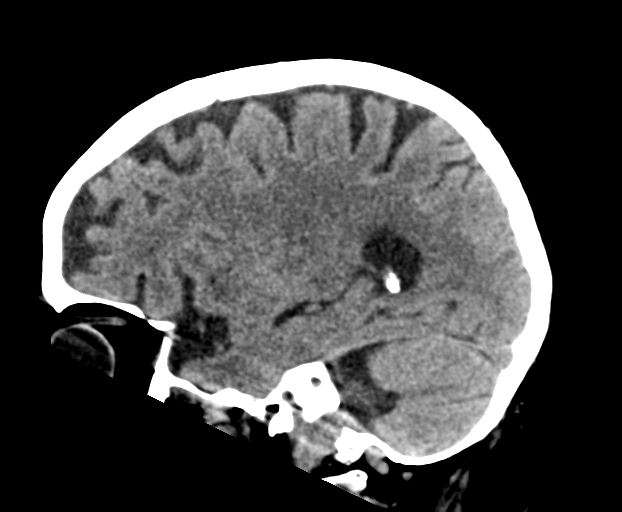

[14 of 47 positions shown; findings below may reference images not displayed]

FINDINGS: CT HEAD FINDINGS

BRAIN:
BRAIN
Cerebral ventricle sizes are concordant with the degree of cerebral
volume loss. Patchy and confluent areas of decreased attenuation are
noted throughout the deep and periventricular white matter of the
cerebral hemispheres bilaterally, compatible with chronic
microvascular ischemic disease.

No evidence of large-territorial acute infarction. No parenchymal
hemorrhage. No mass lesion. No extra-axial collection.

No mass effect or midline shift. No hydrocephalus. Basilar cisterns
are patent.

Vascular: No hyperdense vessel. Atherosclerotic calcifications are
present within the cavernous internal carotid and vertebral
arteries.

Skull: No acute fracture or focal lesion.

Other: None.

CT MAXILLOFACIAL FINDINGS

Osseous: Comminuted minimally displaced fracture of the left orbital
floor. No frank depression. Minimally displaced fracture of the left
medial maxillary wall. Comminuted displaced fracture of the
posterolateral left maxillary wall. Chronic comminuted minimally
displaced bilateral nasal bone fractures. Chronic interventricular
septum fracture. Likely chronic nondisplaced hard palate and vomer
fractures. No destructive process. No orbital roof fracture. No
lamina papyracea fracture. Edentulous maxilla.

Sinuses/Orbits: High density fluid and foci of gas noted within the
left maxillary sinus consistent with trauma/blood products.
Remaining visualized paranasal sinuses and mastoid air cells are
clear. Bilateral lens replacement. Otherwise the orbits are
unremarkable.

Soft tissues: Mild left subcutaneus soft tissue periorbital edema.

CT CERVICAL SPINE FINDINGS

Alignment: Normal.

Skull base and vertebrae: Multilevel degenerative changes of the
spine leading to multilevel severe osseous neural foraminal
stenosis. No severe osseous central canal stenosis. No acute
fracture. No aggressive appearing focal osseous lesion or focal
pathologic process.

Soft tissues and spinal canal: No prevertebral fluid or swelling. No
visible canal hematoma.

Upper chest: Unremarkable.

Other: Atherosclerotic plaque of the major vessels off of the aorta
and the partially visualized aortic arch.
IMPRESSION: 1. No acute intracranial abnormality.
2. Comminuted and minimally displaced fracture of the left orbital
floor. Please correlate clinically with signs and symptoms of
entrapment.
3. Associated medial and posterolateral left maxillary wall fracture
with almost complete opacification of left maxillary sinus with
blood products and foci of gas.
4. No acute displaced fracture or traumatic listhesis of the
cervical spine.
5. Multilevel degenerative changes of the spine leading to
multilevel severe osseous neural foraminal stenosis.

## 2022-09-15 IMAGING — CT CT MAXILLOFACIAL W/O CM
3 series · 14 of 47 positions shown, 16 images · non-contrast
Comparison: None.

CLINICAL DATA: Trauma.  Status post fall.

EXAM:
CT HEAD WITHOUT CONTRAST
CT MAXILLOFACIAL WITHOUT CONTRAST
CT CERVICAL SPINE WITHOUT CONTRAST
TECHNIQUE: Multidetector CT imaging of the head, cervical spine, and
maxillofacial structures were performed using the standard protocol
without intravenous contrast. Multiplanar CT image reconstructions
of the cervical spine and maxillofacial structures were also
generated.

[Series 3: max soft · axial · 0.37mm/px · z∈[-216,-66]mm · 8 of 88 slices shown, 10 images]
[im 7/88  brain]
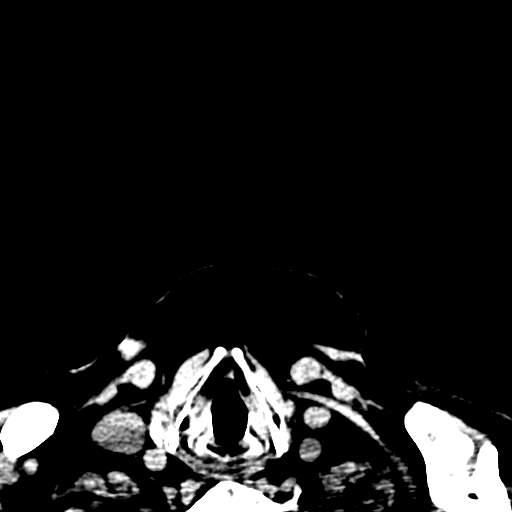
[im 7/88  bone]
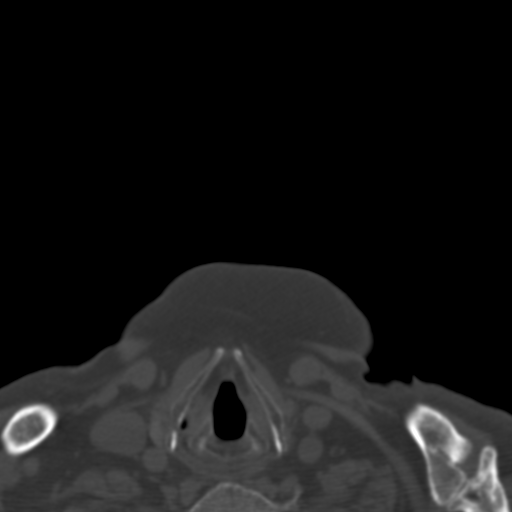
[im 19/88  bone]
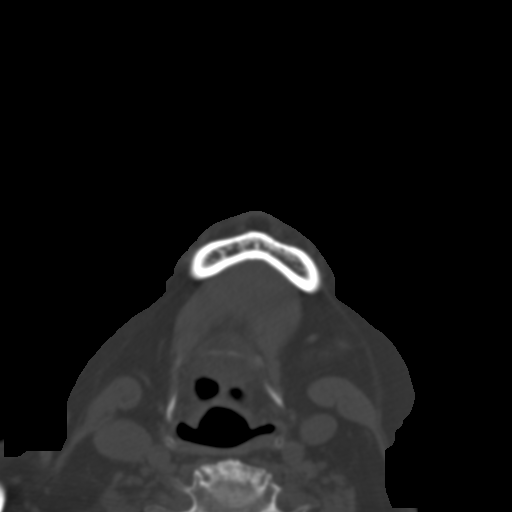
[im 28/88  bone]
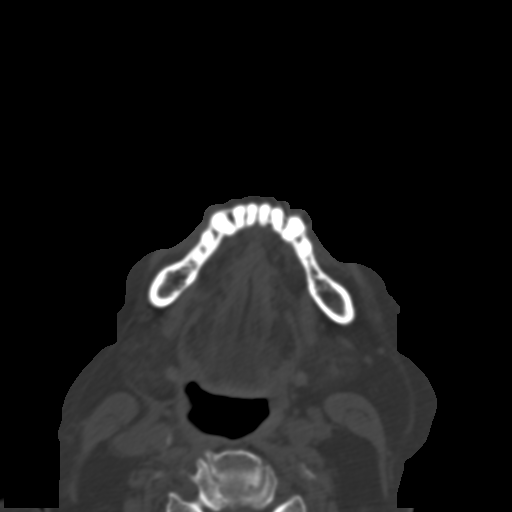
[im 40/88  bone]
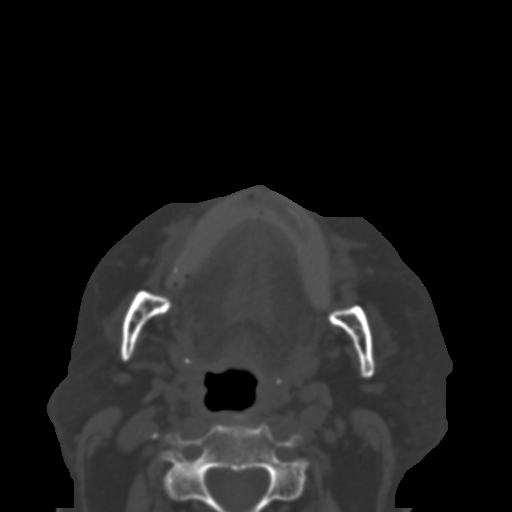
[im 49/88  brain]
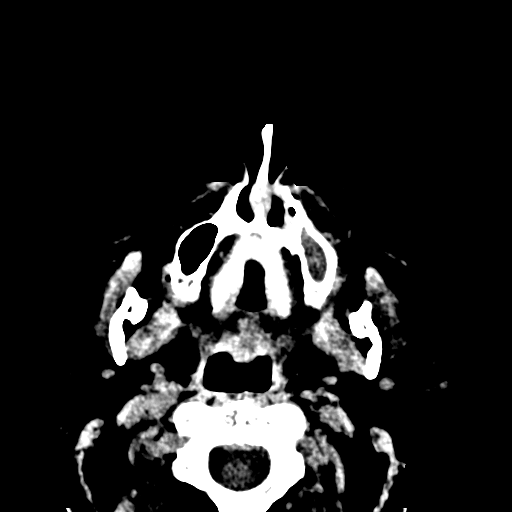
[im 49/88  bone]
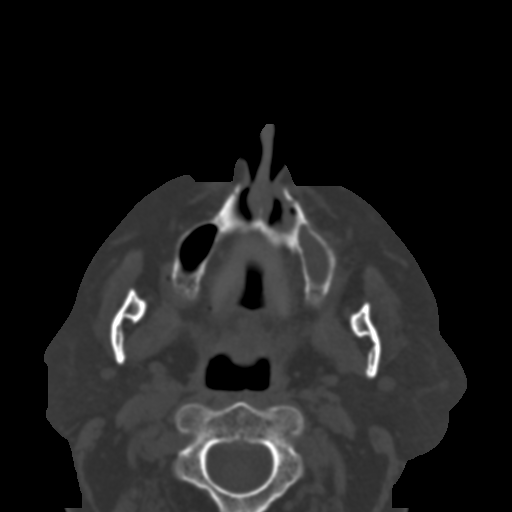
[im 61/88  bone]
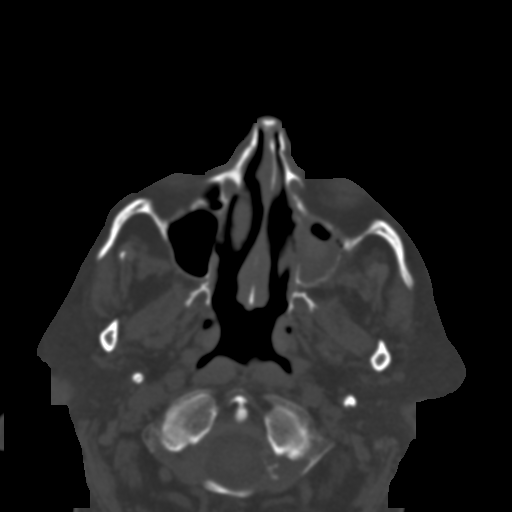
[im 70/88  bone]
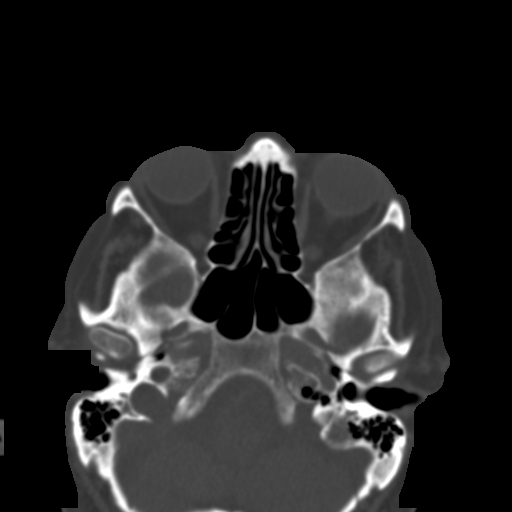
[im 82/88  bone]
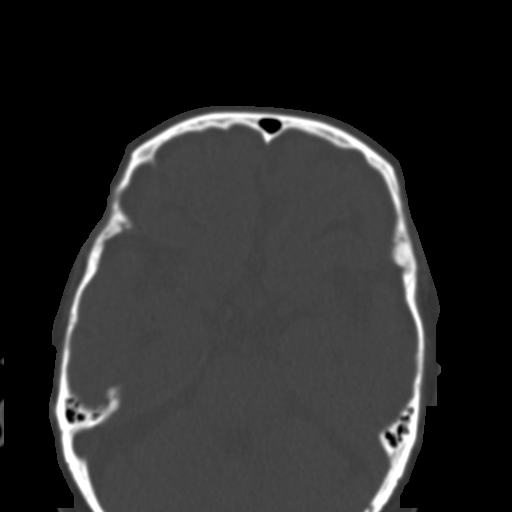

[Series 7: coronal soft · coronal · 0.40mm/px · 3 of 74 slices shown]
[im 25/74  bone]
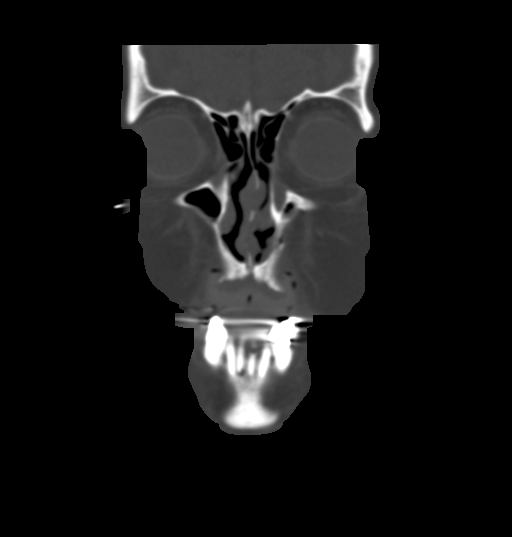
[im 33/74  bone]
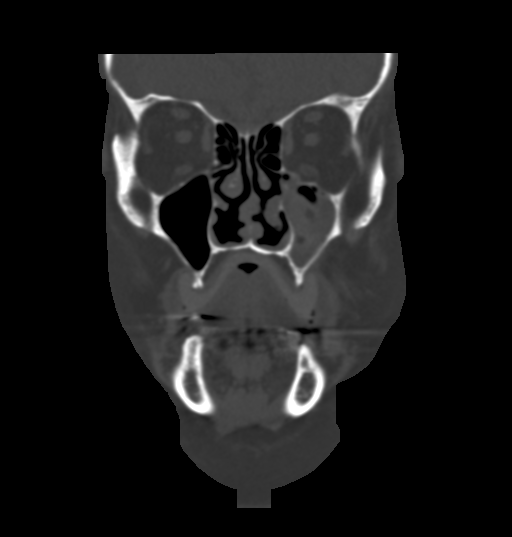
[im 41/74  bone]
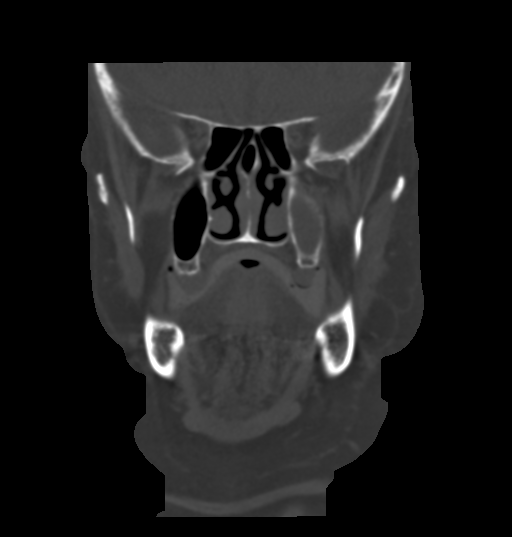

[Series 8: sagittal soft · sagittal · 0.37mm/px · 3 of 102 slices shown]
[im 34/102  bone]
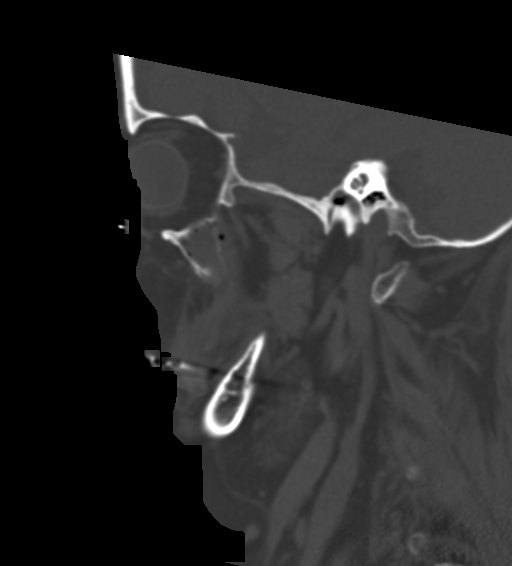
[im 51/102  bone]
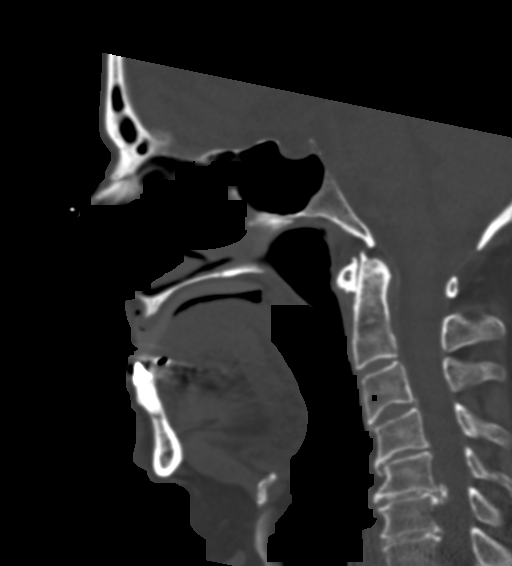
[im 68/102  bone]
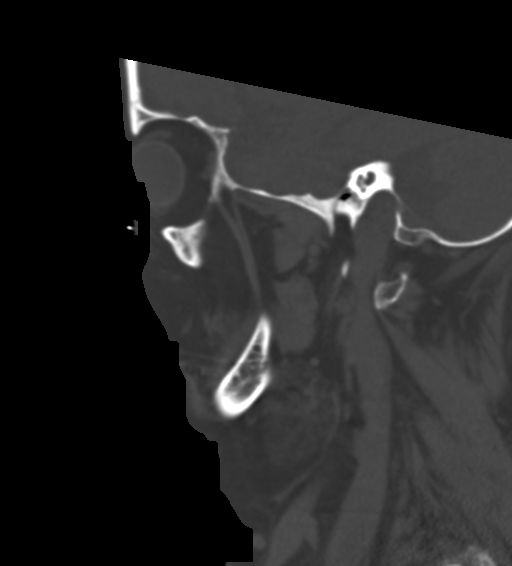

[14 of 47 positions shown; findings below may reference images not displayed]

FINDINGS: CT HEAD FINDINGS

BRAIN:
BRAIN
Cerebral ventricle sizes are concordant with the degree of cerebral
volume loss. Patchy and confluent areas of decreased attenuation are
noted throughout the deep and periventricular white matter of the
cerebral hemispheres bilaterally, compatible with chronic
microvascular ischemic disease.

No evidence of large-territorial acute infarction. No parenchymal
hemorrhage. No mass lesion. No extra-axial collection.

No mass effect or midline shift. No hydrocephalus. Basilar cisterns
are patent.

Vascular: No hyperdense vessel. Atherosclerotic calcifications are
present within the cavernous internal carotid and vertebral
arteries.

Skull: No acute fracture or focal lesion.

Other: None.

CT MAXILLOFACIAL FINDINGS

Osseous: Comminuted minimally displaced fracture of the left orbital
floor. No frank depression. Minimally displaced fracture of the left
medial maxillary wall. Comminuted displaced fracture of the
posterolateral left maxillary wall. Chronic comminuted minimally
displaced bilateral nasal bone fractures. Chronic interventricular
septum fracture. Likely chronic nondisplaced hard palate and vomer
fractures. No destructive process. No orbital roof fracture. No
lamina papyracea fracture. Edentulous maxilla.

Sinuses/Orbits: High density fluid and foci of gas noted within the
left maxillary sinus consistent with trauma/blood products.
Remaining visualized paranasal sinuses and mastoid air cells are
clear. Bilateral lens replacement. Otherwise the orbits are
unremarkable.

Soft tissues: Mild left subcutaneus soft tissue periorbital edema.

CT CERVICAL SPINE FINDINGS

Alignment: Normal.

Skull base and vertebrae: Multilevel degenerative changes of the
spine leading to multilevel severe osseous neural foraminal
stenosis. No severe osseous central canal stenosis. No acute
fracture. No aggressive appearing focal osseous lesion or focal
pathologic process.

Soft tissues and spinal canal: No prevertebral fluid or swelling. No
visible canal hematoma.

Upper chest: Unremarkable.

Other: Atherosclerotic plaque of the major vessels off of the aorta
and the partially visualized aortic arch.
IMPRESSION: 1. No acute intracranial abnormality.
2. Comminuted and minimally displaced fracture of the left orbital
floor. Please correlate clinically with signs and symptoms of
entrapment.
3. Associated medial and posterolateral left maxillary wall fracture
with almost complete opacification of left maxillary sinus with
blood products and foci of gas.
4. No acute displaced fracture or traumatic listhesis of the
cervical spine.
5. Multilevel degenerative changes of the spine leading to
multilevel severe osseous neural foraminal stenosis.

## 2022-09-15 IMAGING — DX DG KNEE COMPLETE 4+V*L*
4 series · 4 of 4 positions shown · non-contrast
Comparison: None.

CLINICAL DATA: Tripped and fell.  Knee pain.

EXAM:
LEFT KNEE - COMPLETE 4+ VIEW

[knee ap (1 of 2)]
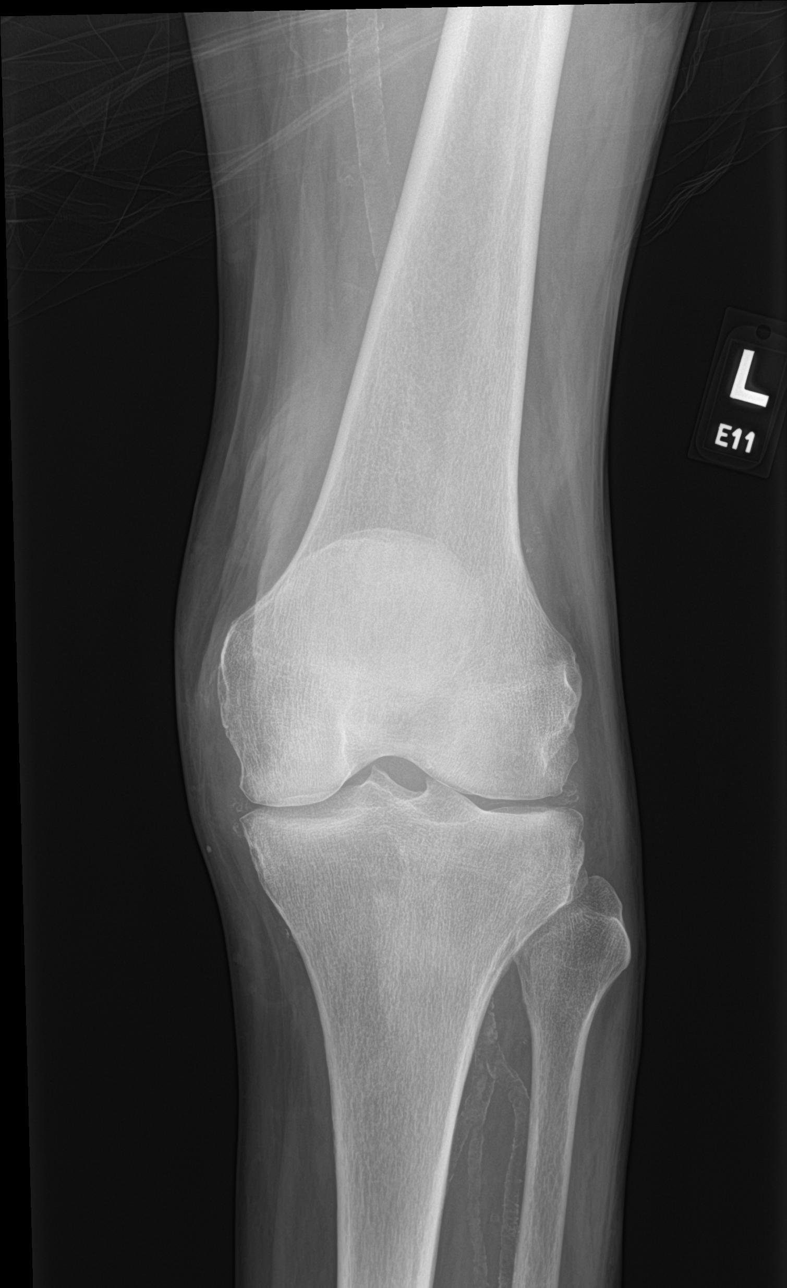

[knee lat]
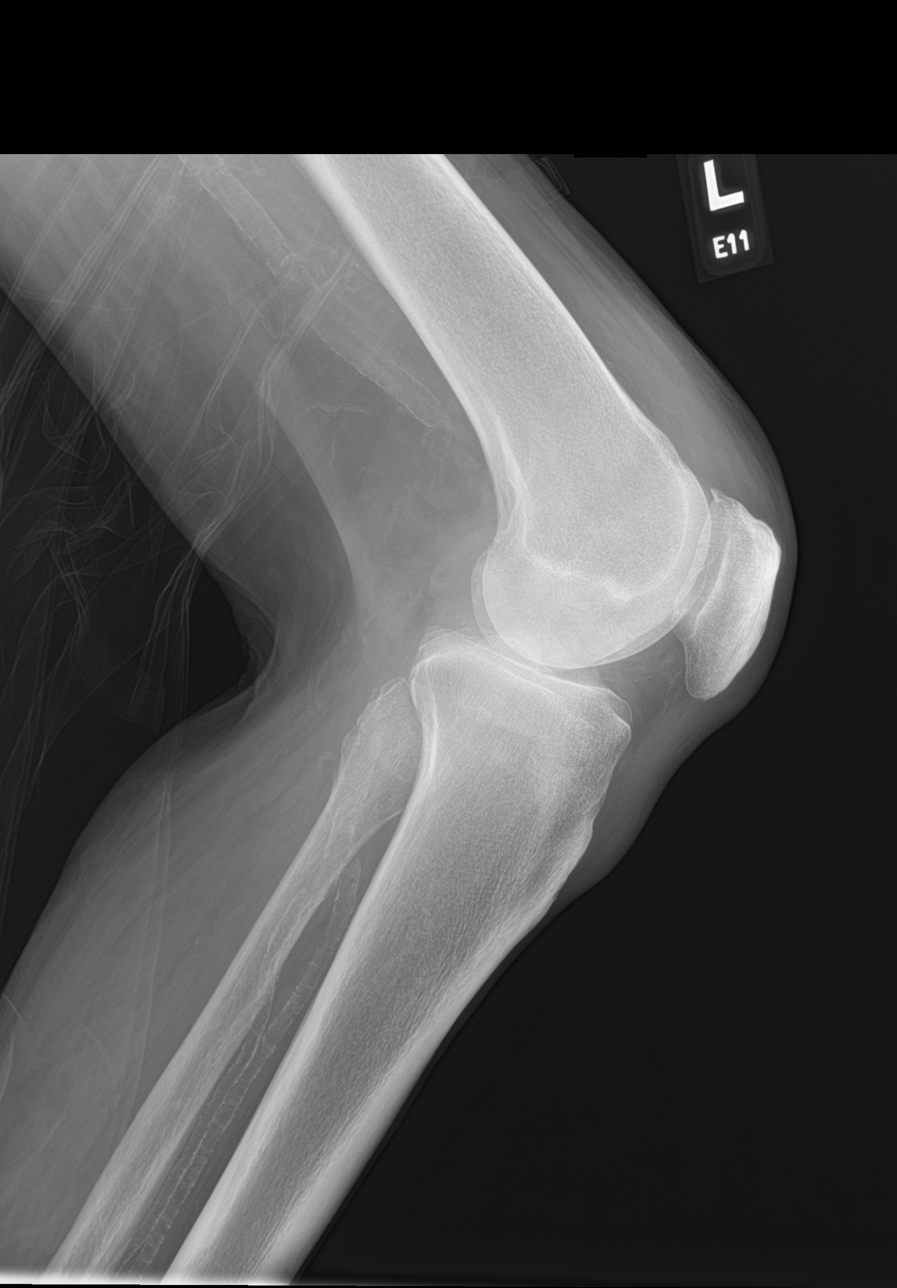

[knee obl]
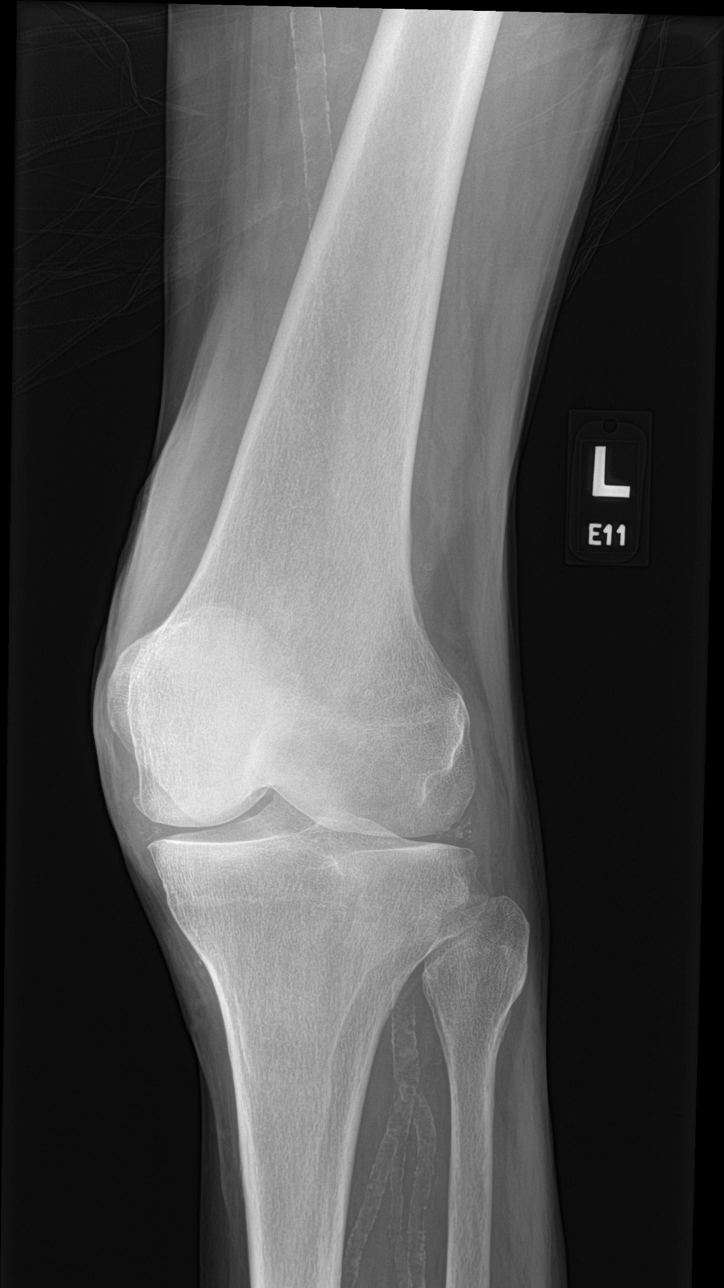

[knee ap (2 of 2)]
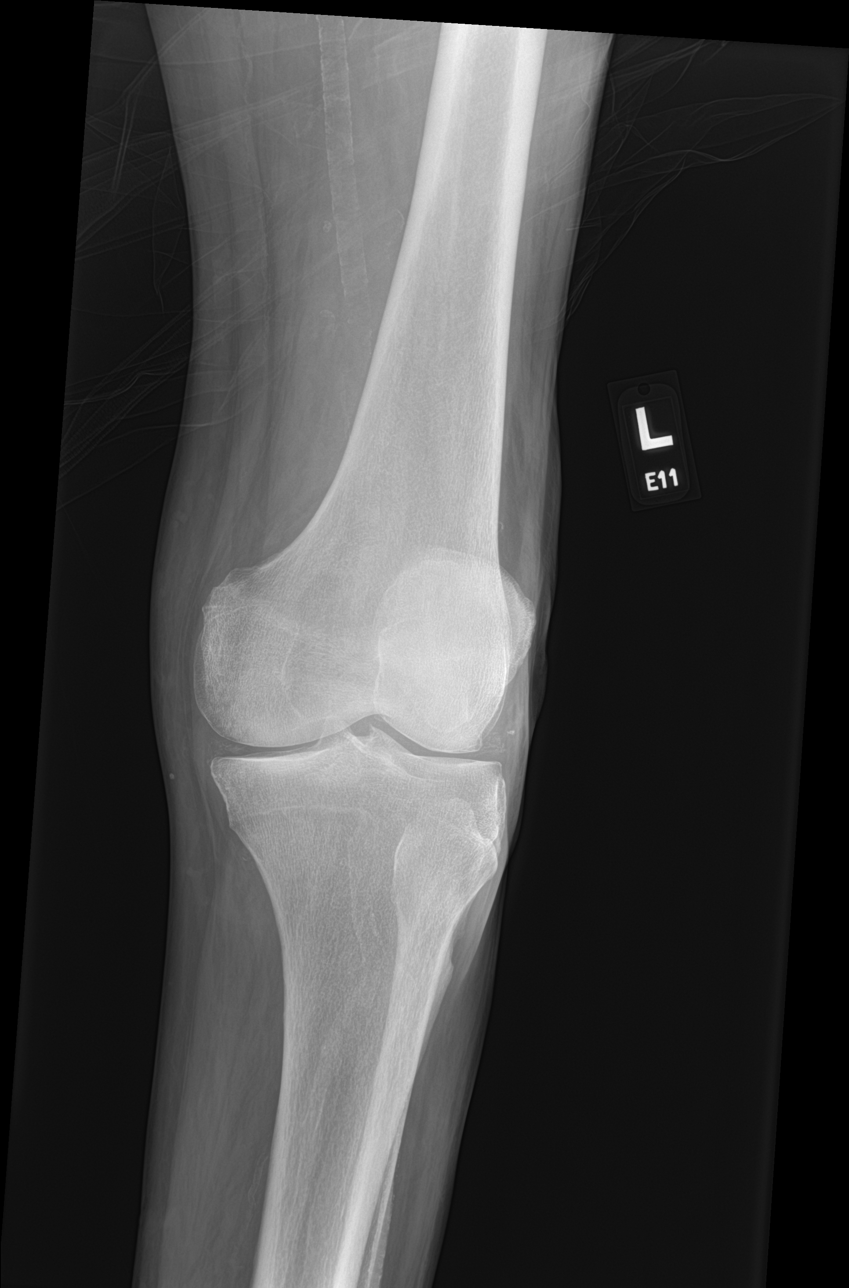

[4 of 4 positions shown; findings below may reference images not displayed]

FINDINGS: No sign of acute fracture. Small knee joint effusion. Mild
degenerative changes of the medial and lateral weight-bearing
compartments with mild joint space narrowing and chondrocalcinosis.
No patellofemoral disease is appreciated. Regional arterial
calcification incidentally noted.
IMPRESSION: Small knee joint effusion. Mild degenerative changes of the medial
and lateral compartments. No acute traumatic bone finding.

## 2022-09-22 ENCOUNTER — Telehealth: Payer: Self-pay | Admitting: Podiatry

## 2022-09-22 NOTE — Telephone Encounter (Signed)
Pt's son called to inquire about the test results from [facility]; he wanted to know if it is ok to have dad's ingrown removed. Please advise.

## 2022-09-22 NOTE — Telephone Encounter (Signed)
Have you spoken with this patient already?

## 2022-09-25 ENCOUNTER — Ambulatory Visit: Payer: PPO | Admitting: Podiatry

## 2022-09-25 DIAGNOSIS — L600 Ingrowing nail: Secondary | ICD-10-CM | POA: Diagnosis not present

## 2022-09-25 DIAGNOSIS — I739 Peripheral vascular disease, unspecified: Secondary | ICD-10-CM

## 2022-09-25 MED ORDER — CEPHALEXIN 500 MG PO CAPS: 500.0000 mg | ORAL_CAPSULE | Freq: Three times a day (TID) | ORAL | 0 refills | Status: AC

## 2022-09-25 NOTE — Patient Instructions (Signed)

## 2022-09-25 NOTE — Progress Notes (Signed)
  Subjective:  Patient ID: Aaron Houston, male    DOB: [birth year],  MRN: [id number]  Chief Complaint  Patient presents with   Follow-up    bil great toe ingrowns    76 y.o. male presents for follow-up on ingrown nails on the bilateral great toe.  At last visit he was diagnosed with ingrown nail borders bilaterally of the right and left hallux.  There was concern for PAD so he was referred for ABI testing which showed normal toe pressure at the hallux bilaterally.  We are now okay to proceed with ingrown nail removal procedure.  Objective:  Physical Exam: warm, good capillary refill, nail exam Incurvation is present along the bilateral hallux nail bilateral border. There is localized edema with mild erythema and increase in warmth around the nail border. There is mild sanguinous drainage. There is no ascending cellulitis. No malodor. , no trophic changes or ulcerative lesions.  DP pulse is 2+ out of 4 on the right foot however the PT is nonpalpable.  Left foot nonpalpable DP and PT pulses delayed capillary refill to the right and left hallux. Left Foot: Ingrown nail to the left hallux with pain on palpation Right Foot: Ingrown nail to the right hallux with pain on palpation  No images are attached to the encounter.  Assessment:   1. Ingrown nail of great toe of right foot   2. Ingrown nail of great toe of left foot   3. PAD (peripheral artery disease) ([place])       Plan:  Patient was evaluated and treated and all questions answered.   Ingrown Nail, bilaterally -Patient elects to proceed with minor surgery to remove ingrown toenail today. Consent reviewed and signed by patient. -Ingrown nail excised. See procedure note. -Educated on post-procedure care including soaking. Written instructions provided and reviewed. -Patient to follow up in 2 weeks for nail check. Rx for prophylactic cephalexin 500 mg 3 times daily for 5 days sent to the patient's pharmacy.  Procedure: Excision of  Ingrown Toenail Location: Bilateral 1st toe  bilateral  nail borders. Anesthesia: Lidocaine 1% plain; 1.5 mL and Marcaine 0.5% plain; 1.5 mL, digital block. Skin Prep: Betadine. Dressing: Silvadene; telfa; dry, sterile, compression dressing. Technique: Following skin prep, the toe was exsanguinated and a tourniquet was secured at the base of the toe. The affected nail border was freed, split with a nail splitter, and excised. Chemical matrixectomy was then performed with phenol and irrigated out with alcohol. The tourniquet was then removed and sterile dressing applied. Disposition: Patient tolerated procedure well. Patient to return in 2 weeks for follow-up.    Return in about 2 weeks (around 10/09/2022) for Follow-up bilateral hallux ingrown.         Everitt Amber, DPM [facility]

## 2022-10-09 ENCOUNTER — Ambulatory Visit: Payer: PPO | Admitting: Podiatry

## 2022-10-09 DIAGNOSIS — R319 Hematuria, unspecified: Secondary | ICD-10-CM | POA: Diagnosis not present

## 2022-10-09 DIAGNOSIS — Z6828 Body mass index (BMI) 28.0-28.9, adult: Secondary | ICD-10-CM | POA: Diagnosis not present

## 2022-10-09 DIAGNOSIS — R42 Dizziness and giddiness: Secondary | ICD-10-CM | POA: Diagnosis not present

## 2022-10-09 DIAGNOSIS — H6590 Unspecified nonsuppurative otitis media, unspecified ear: Secondary | ICD-10-CM | POA: Diagnosis not present

## 2022-10-19 DIAGNOSIS — I10 Essential (primary) hypertension: Secondary | ICD-10-CM | POA: Diagnosis not present

## 2022-10-19 DIAGNOSIS — Z7901 Long term (current) use of anticoagulants: Secondary | ICD-10-CM | POA: Diagnosis not present

## 2022-10-19 DIAGNOSIS — Z955 Presence of coronary angioplasty implant and graft: Secondary | ICD-10-CM | POA: Diagnosis not present

## 2022-10-19 DIAGNOSIS — B451 Cerebral cryptococcosis: Secondary | ICD-10-CM | POA: Diagnosis not present

## 2022-10-19 DIAGNOSIS — I252 Old myocardial infarction: Secondary | ICD-10-CM | POA: Diagnosis not present

## 2022-10-19 DIAGNOSIS — I480 Paroxysmal atrial fibrillation: Secondary | ICD-10-CM | POA: Diagnosis not present

## 2022-10-19 DIAGNOSIS — I2510 Atherosclerotic heart disease of native coronary artery without angina pectoris: Secondary | ICD-10-CM | POA: Diagnosis not present

## 2022-10-23 DIAGNOSIS — M109 Gout, unspecified: Secondary | ICD-10-CM | POA: Diagnosis not present

## 2022-10-23 DIAGNOSIS — R319 Hematuria, unspecified: Secondary | ICD-10-CM | POA: Diagnosis not present

## 2022-10-31 DIAGNOSIS — J988 Other specified respiratory disorders: Secondary | ICD-10-CM | POA: Diagnosis not present

## 2022-10-31 DIAGNOSIS — H66001 Acute suppurative otitis media without spontaneous rupture of ear drum, right ear: Secondary | ICD-10-CM | POA: Diagnosis not present

## 2023-01-05 DIAGNOSIS — Z6820 Body mass index (BMI) 20.0-20.9, adult: Secondary | ICD-10-CM | POA: Diagnosis not present

## 2023-01-05 DIAGNOSIS — I4891 Unspecified atrial fibrillation: Secondary | ICD-10-CM | POA: Diagnosis not present

## 2023-01-05 DIAGNOSIS — I10 Essential (primary) hypertension: Secondary | ICD-10-CM | POA: Diagnosis not present

## 2023-06-04 DIAGNOSIS — N453 Epididymo-orchitis: Secondary | ICD-10-CM | POA: Diagnosis not present

## 2023-06-04 DIAGNOSIS — B451 Cerebral cryptococcosis: Secondary | ICD-10-CM | POA: Diagnosis not present

## 2023-06-04 DIAGNOSIS — N401 Enlarged prostate with lower urinary tract symptoms: Secondary | ICD-10-CM | POA: Diagnosis not present

## 2023-06-04 DIAGNOSIS — R42 Dizziness and giddiness: Secondary | ICD-10-CM | POA: Diagnosis not present

## 2023-06-04 DIAGNOSIS — H538 Other visual disturbances: Secondary | ICD-10-CM | POA: Diagnosis not present

## 2023-06-04 DIAGNOSIS — R200 Anesthesia of skin: Secondary | ICD-10-CM | POA: Diagnosis not present

## 2023-06-04 DIAGNOSIS — N138 Other obstructive and reflux uropathy: Secondary | ICD-10-CM | POA: Diagnosis not present

## 2023-06-08 DIAGNOSIS — H532 Diplopia: Secondary | ICD-10-CM | POA: Diagnosis not present

## 2023-06-08 DIAGNOSIS — H43813 Vitreous degeneration, bilateral: Secondary | ICD-10-CM | POA: Diagnosis not present

## 2023-06-08 DIAGNOSIS — H35362 Drusen (degenerative) of macula, left eye: Secondary | ICD-10-CM | POA: Diagnosis not present

## 2023-06-08 DIAGNOSIS — H52223 Regular astigmatism, bilateral: Secondary | ICD-10-CM | POA: Diagnosis not present

## 2023-06-24 DIAGNOSIS — H532 Diplopia: Secondary | ICD-10-CM | POA: Diagnosis not present

## 2023-06-24 DIAGNOSIS — H43813 Vitreous degeneration, bilateral: Secondary | ICD-10-CM | POA: Diagnosis not present

## 2023-06-24 DIAGNOSIS — H52223 Regular astigmatism, bilateral: Secondary | ICD-10-CM | POA: Diagnosis not present

## 2023-06-24 DIAGNOSIS — H35362 Drusen (degenerative) of macula, left eye: Secondary | ICD-10-CM | POA: Diagnosis not present

## 2023-06-25 DIAGNOSIS — I10 Essential (primary) hypertension: Secondary | ICD-10-CM | POA: Diagnosis not present

## 2023-06-25 DIAGNOSIS — I2510 Atherosclerotic heart disease of native coronary artery without angina pectoris: Secondary | ICD-10-CM | POA: Diagnosis not present

## 2023-06-25 DIAGNOSIS — Z7901 Long term (current) use of anticoagulants: Secondary | ICD-10-CM | POA: Diagnosis not present

## 2023-06-25 DIAGNOSIS — E785 Hyperlipidemia, unspecified: Secondary | ICD-10-CM | POA: Diagnosis not present

## 2023-06-25 DIAGNOSIS — B451 Cerebral cryptococcosis: Secondary | ICD-10-CM | POA: Diagnosis not present

## 2023-06-25 DIAGNOSIS — I480 Paroxysmal atrial fibrillation: Secondary | ICD-10-CM | POA: Diagnosis not present

## 2023-11-04 DIAGNOSIS — I480 Paroxysmal atrial fibrillation: Secondary | ICD-10-CM | POA: Diagnosis not present

## 2023-11-04 DIAGNOSIS — Z515 Encounter for palliative care: Secondary | ICD-10-CM | POA: Diagnosis not present

## 2023-11-04 DIAGNOSIS — N1831 Chronic kidney disease, stage 3a: Secondary | ICD-10-CM | POA: Diagnosis not present

## 2024-02-01 DIAGNOSIS — B451 Cerebral cryptococcosis: Secondary | ICD-10-CM | POA: Diagnosis not present

## 2024-03-03 DIAGNOSIS — B451 Cerebral cryptococcosis: Secondary | ICD-10-CM | POA: Diagnosis not present

## 2024-04-05 DIAGNOSIS — Z6821 Body mass index (BMI) 21.0-21.9, adult: Secondary | ICD-10-CM | POA: Diagnosis not present

## 2024-04-05 DIAGNOSIS — M25551 Pain in right hip: Secondary | ICD-10-CM | POA: Diagnosis not present

## 2024-05-03 DIAGNOSIS — I10 Essential (primary) hypertension: Secondary | ICD-10-CM | POA: Diagnosis not present

## 2024-05-03 DIAGNOSIS — I2510 Atherosclerotic heart disease of native coronary artery without angina pectoris: Secondary | ICD-10-CM | POA: Diagnosis not present

## 2024-05-03 DIAGNOSIS — Z79899 Other long term (current) drug therapy: Secondary | ICD-10-CM | POA: Diagnosis not present

## 2024-05-03 DIAGNOSIS — E785 Hyperlipidemia, unspecified: Secondary | ICD-10-CM | POA: Diagnosis not present

## 2024-05-03 DIAGNOSIS — R7303 Prediabetes: Secondary | ICD-10-CM | POA: Diagnosis not present

## 2024-05-03 DIAGNOSIS — Z6822 Body mass index (BMI) 22.0-22.9, adult: Secondary | ICD-10-CM | POA: Diagnosis not present

## 2024-05-03 DIAGNOSIS — I4891 Unspecified atrial fibrillation: Secondary | ICD-10-CM | POA: Diagnosis not present

## 2024-05-03 DIAGNOSIS — D649 Anemia, unspecified: Secondary | ICD-10-CM | POA: Diagnosis not present

## 2024-05-03 DIAGNOSIS — N183 Chronic kidney disease, stage 3 unspecified: Secondary | ICD-10-CM | POA: Diagnosis not present

## 2024-07-14 DIAGNOSIS — N138 Other obstructive and reflux uropathy: Secondary | ICD-10-CM | POA: Diagnosis not present

## 2024-07-14 DIAGNOSIS — N3281 Overactive bladder: Secondary | ICD-10-CM | POA: Diagnosis not present

## 2024-07-14 DIAGNOSIS — N401 Enlarged prostate with lower urinary tract symptoms: Secondary | ICD-10-CM | POA: Diagnosis not present

## 2024-07-17 DIAGNOSIS — M19041 Primary osteoarthritis, right hand: Secondary | ICD-10-CM | POA: Diagnosis not present

## 2024-07-17 DIAGNOSIS — M19042 Primary osteoarthritis, left hand: Secondary | ICD-10-CM | POA: Diagnosis not present

## 2024-07-17 DIAGNOSIS — B451 Cerebral cryptococcosis: Secondary | ICD-10-CM | POA: Diagnosis not present

## 2024-07-17 DIAGNOSIS — Z79899 Other long term (current) drug therapy: Secondary | ICD-10-CM | POA: Diagnosis not present

## 2024-07-17 DIAGNOSIS — M1A09X0 Idiopathic chronic gout, multiple sites, without tophus (tophi): Secondary | ICD-10-CM | POA: Diagnosis not present

## 2024-07-21 DIAGNOSIS — Z8619 Personal history of other infectious and parasitic diseases: Secondary | ICD-10-CM | POA: Diagnosis not present

## 2024-07-21 DIAGNOSIS — B451 Cerebral cryptococcosis: Secondary | ICD-10-CM | POA: Diagnosis not present

## 2024-08-03 DIAGNOSIS — E785 Hyperlipidemia, unspecified: Secondary | ICD-10-CM | POA: Diagnosis not present

## 2024-08-03 DIAGNOSIS — M159 Polyosteoarthritis, unspecified: Secondary | ICD-10-CM | POA: Diagnosis not present

## 2024-08-03 DIAGNOSIS — Z6821 Body mass index (BMI) 21.0-21.9, adult: Secondary | ICD-10-CM | POA: Diagnosis not present

## 2024-08-03 DIAGNOSIS — Z23 Encounter for immunization: Secondary | ICD-10-CM | POA: Diagnosis not present

## 2024-08-03 DIAGNOSIS — N183 Chronic kidney disease, stage 3 unspecified: Secondary | ICD-10-CM | POA: Diagnosis not present

## 2024-08-03 DIAGNOSIS — M109 Gout, unspecified: Secondary | ICD-10-CM | POA: Diagnosis not present

## 2024-08-11 DIAGNOSIS — E875 Hyperkalemia: Secondary | ICD-10-CM | POA: Diagnosis not present
